# Patient Record
Sex: Female | Born: 1935 | Race: Black or African American | Hispanic: No | Smoking: Former smoker
Health system: Southern US, Community
[De-identification: ages and names within clinical notes are randomized; demographics above are authoritative.]

## PROBLEM LIST (undated history)

## (undated) DIAGNOSIS — I739 Peripheral vascular disease, unspecified: Secondary | ICD-10-CM

## (undated) DIAGNOSIS — M329 Systemic lupus erythematosus, unspecified: Secondary | ICD-10-CM

## (undated) DIAGNOSIS — K219 Gastro-esophageal reflux disease without esophagitis: Secondary | ICD-10-CM

## (undated) DIAGNOSIS — R011 Cardiac murmur, unspecified: Secondary | ICD-10-CM

## (undated) DIAGNOSIS — M1990 Unspecified osteoarthritis, unspecified site: Secondary | ICD-10-CM

## (undated) DIAGNOSIS — I10 Essential (primary) hypertension: Secondary | ICD-10-CM

## (undated) DIAGNOSIS — I5032 Chronic diastolic (congestive) heart failure: Secondary | ICD-10-CM

## (undated) DIAGNOSIS — IMO0002 Reserved for concepts with insufficient information to code with codable children: Secondary | ICD-10-CM

## (undated) DIAGNOSIS — M349 Systemic sclerosis, unspecified: Secondary | ICD-10-CM

## (undated) HISTORY — PX: ABDOMINAL HYSTERECTOMY: SHX81

## (undated) HISTORY — PX: CORONARY ANGIOPLASTY: SHX604

---

## 2000-11-19 ENCOUNTER — Encounter: Payer: Self-pay | Admitting: Internal Medicine

## 2000-11-19 ENCOUNTER — Ambulatory Visit (HOSPITAL_COMMUNITY): Admission: RE | Admit: 2000-11-19 | Discharge: 2000-11-19 | Payer: Self-pay | Admitting: Internal Medicine

## 2001-02-20 ENCOUNTER — Ambulatory Visit (HOSPITAL_COMMUNITY): Admission: RE | Admit: 2001-02-20 | Discharge: 2001-02-20 | Payer: Self-pay | Admitting: *Deleted

## 2001-02-20 ENCOUNTER — Encounter: Payer: Self-pay | Admitting: *Deleted

## 2004-11-13 ENCOUNTER — Encounter: Admission: RE | Admit: 2004-11-13 | Discharge: 2004-11-13 | Payer: Self-pay | Admitting: Internal Medicine

## 2008-08-18 ENCOUNTER — Encounter: Admission: RE | Admit: 2008-08-18 | Discharge: 2008-08-18 | Payer: Self-pay | Admitting: Internal Medicine

## 2010-07-29 ENCOUNTER — Encounter: Payer: Self-pay | Admitting: Internal Medicine

## 2010-07-30 ENCOUNTER — Encounter: Payer: Self-pay | Admitting: Internal Medicine

## 2010-08-23 ENCOUNTER — Other Ambulatory Visit: Payer: Self-pay | Admitting: Internal Medicine

## 2010-08-23 DIAGNOSIS — R112 Nausea with vomiting, unspecified: Secondary | ICD-10-CM

## 2010-08-24 ENCOUNTER — Ambulatory Visit
Admission: RE | Admit: 2010-08-24 | Discharge: 2010-08-24 | Disposition: A | Discharge status: Home / Self Care | Payer: Medicare Other | Source: Ambulatory Visit | Attending: Internal Medicine | Admitting: Internal Medicine

## 2010-08-24 DIAGNOSIS — R112 Nausea with vomiting, unspecified: Secondary | ICD-10-CM

## 2010-08-24 MED ORDER — IOHEXOL 300 MG/ML  SOLN
100.0000 mL | Freq: Once | INTRAMUSCULAR | Status: AC | PRN
  Administered 2010-08-24: 100 mL via INTRAVENOUS

## 2010-11-24 NOTE — Cardiovascular Report (Signed)
[place]. [facility]  Patient:    Shannon Rice, Shannon Rice                   MRN: [id number] Proc. Date: 02/20/01 Adm. Date:  [id number] Attending:  Lenise Herald H CC:         Stefan Church. Karilyn Cota, M.D.   Cardiac Catheterization  PROCEDURES: 1. Left heart catheterization. 2. Coronary angiography. 3. Left ventriculogram. 4. Abdominal aortogram.  INDICATIONS:  The patient is a 77 year old female, patient of Dr. Karilyn Cota, with a history of chest tightness and abnormal Cardiolite.  The patient also has a history of hypertension.  She did have a stress Cardiolite revealing evidence of anteroapical ischemia with normal EF.  Two-dimensional echocardiogram revealed mild left ventricular thickening with normal EF.  She is now brought to the cardiac catheterization lab to define her coronary anatomy.  DESCRIPTION OF PROCEDURE:  After given informed written consent, the patient was brought to the cardiac catheterization lab where her right and left groins were shaved, prepped, and draped in the usual sterile fashion.  ECG monitoring was established.  Using modified Seldinger technique, a #6 French arterial sheath was inserted in the right femoral artery.  A 6 French diagnostic catheter was then used to perform diagnostic angiography.  This reveals a large left main with mild 30% ostial tapering.  The LAD is a large vessel which coursed to the apex and gave rise to two diagonal branches.  There is noted to be mild calcification in the proximal LAD.  The first and second diagonals are medium sized vessels with no significant disease.  The left coronary artery also gave rise to a medium sized ramus intermedius, which is noted to have mild 40% ostial narrowing.  The AV groove circumflex is a large vessel which gave rise to two obtuse marginal branches.  The AV groove circumflex has no significant disease.  The first OM is a large vessel which bifurcates distally and has  no significant disease.  The second OM is a medium sized vessel with no significant disease.  The right coronary artery is a medium sized vessel which is dominant and gives rise to both the PDA as well as the posterolateral branch.  There is mild 50% mid RCA narrowing.  The PDA and posterolateral branch have no significant disease.  LEFT VENTRICULOGRAM:  The left ventriculogram reveals preserved EF at 70%.  Abdominal aortogram reveals no evidence of significant renal artery stenosis. There is a small infrarenal abdominal aneurysm noted.  There does not appear to be any significant iliac disease.  HEMODYNAMICS:  Systemic arterial pressure 149/62, LV systemic pressure 149/12, LVEDP of 20.  CONCLUSIONS: 1. No significant coronary artery disease. 2. Normal left ventricular systolic function. 3. No evidence of renal artery stenosis. 4. Small infrarenal abdominal aneurysm. 5. Systemic hypertension. DD:  02/20/01 TD:  02/20/01 Job: [id number]

## 2013-09-23 ENCOUNTER — Other Ambulatory Visit: Payer: Self-pay | Admitting: Internal Medicine

## 2013-09-23 ENCOUNTER — Ambulatory Visit
Admission: RE | Admit: 2013-09-23 | Discharge: 2013-09-23 | Disposition: A | Discharge status: Home / Self Care | Payer: Medicare Other | Source: Ambulatory Visit | Attending: Internal Medicine | Admitting: Internal Medicine

## 2013-09-23 DIAGNOSIS — R222 Localized swelling, mass and lump, trunk: Secondary | ICD-10-CM

## 2014-05-13 ENCOUNTER — Ambulatory Visit
Admission: RE | Admit: 2014-05-13 | Discharge: 2014-05-13 | Disposition: A | Discharge status: Home / Self Care | Payer: Medicare Other | Source: Ambulatory Visit | Attending: Internal Medicine | Admitting: Internal Medicine

## 2014-05-13 ENCOUNTER — Other Ambulatory Visit: Payer: Self-pay | Admitting: Internal Medicine

## 2014-05-13 DIAGNOSIS — R059 Cough, unspecified: Secondary | ICD-10-CM

## 2014-05-13 DIAGNOSIS — R05 Cough: Secondary | ICD-10-CM

## 2014-05-13 DIAGNOSIS — R634 Abnormal weight loss: Secondary | ICD-10-CM

## 2014-05-31 ENCOUNTER — Encounter (HOSPITAL_BASED_OUTPATIENT_CLINIC_OR_DEPARTMENT_OTHER): Payer: Medicare Other | Attending: Plastic Surgery

## 2014-05-31 DIAGNOSIS — I2510 Atherosclerotic heart disease of native coronary artery without angina pectoris: Secondary | ICD-10-CM | POA: Insufficient documentation

## 2014-05-31 DIAGNOSIS — L98499 Non-pressure chronic ulcer of skin of other sites with unspecified severity: Secondary | ICD-10-CM | POA: Diagnosis not present

## 2014-05-31 DIAGNOSIS — Z7982 Long term (current) use of aspirin: Secondary | ICD-10-CM | POA: Insufficient documentation

## 2014-05-31 DIAGNOSIS — I739 Peripheral vascular disease, unspecified: Secondary | ICD-10-CM | POA: Diagnosis present

## 2014-05-31 DIAGNOSIS — Z720 Tobacco use: Secondary | ICD-10-CM | POA: Insufficient documentation

## 2014-05-31 DIAGNOSIS — L930 Discoid lupus erythematosus: Secondary | ICD-10-CM | POA: Diagnosis not present

## 2014-05-31 NOTE — Consult Note (Signed)
[name]:  [name], [name]          ACCOUNT NO.:  [id number]  MEDICAL RECORD NO.:  [id number]  LOCATION:  FOOT                         FACILITY:  [facility]  PHYSICIAN:  Glenna FellowsBrinda Edell Mesenbrink, MD   DATE OF BIRTH:  06/25/1931  DATE OF CONSULTATION:  05/31/2014 DATE OF DISCHARGE:                                CONSULTATION   CHIEF COMPLAINT:  Bilateral hand ulcerations.  HISTORY OF PRESENT ILLNESS:  The patient is an 80 year old female with history of discoid lupus that is referred to the [facility] by Dr. Ludwig ClarksMoreira for evaluation.  The patient has previously been evaluated by Dr. Kellie Simmeringruslow [facility].  The patient notes that since approximately August of 2015, she has had an ulceration over her left index finger.  She reports that the right hand has also been ulcerated throughout that time.  Review of the studies sent with the patient indicates that hemoglobin of 13.1, white count of 5.3, platelets of 148, albumin is listed as 4.0, blood glucose is 98, creatinine is normal at 0.53.  Review of the chart indicates an x-ray was performed of the left hand and was read as did not suggest bony involvement.  Her current wound care has been cleaning with peroxide and placing Neosporin ointment.  She occasionally covers the wounds.  She continues to actively smoke.  Review of her referral note indicates the patient has significant peripheral vascular disease, bilateral internal carotid stenosis, coronary artery disease, and discoid lupus.  The referral note also states the patient has associated scleroderma, both the patient and her daughter who accompanied her today state that they were not aware that she has diagnosis of scleroderma.  PAST MEDICAL HISTORY:  As per HPI.  Coronary artery disease with extensive atherosclerosis from last imaging done in 2012, last echocardiogram in 2014 showed grade 1 diastolic dysfunction, last cardiac stress test was normal in September of  2014.  She has a history of bilateral moderate internal carotid stenosis from followup study dated March 16, 2014.  She has a history of discoid lupus for which she is on topical medication.  She is followed by Rheumatology.  She has a history of weight loss.  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  History of euthyroid goiter.  PAST SURGICAL HISTORY:  Includes cataract surgery and hysterectomy.  MEDICATIONS:  Include metoprolol, aspirin, benazepril, amlodipine, Livalo, and Centrum.  She takes Boost supplementation.  Desonide cream.  ALLERGIES:  Include CODEINE which causes nausea.  SOCIAL HISTORY:  The patient is an active smoker.  PHYSICAL EXAMINATION:  Blood pressure is 176/67, temperature is 98, pulse is 98, weight is 100 pounds, height is 5 feet 2 inches.  Over her right index finger, she has ulceration measured as 1.1 x 0.6 x 0.2 cm. This is over the distal phalanx medially and encroaches the joint space. Over the left index finger, she has an open wound over the medial DIP joint measured as 2 x 1.8 x 0.9 cm.  This second wound has exposure of her joint, ligaments and tendons which are appear dry in nature.  There is no surrounding cellulitis.  She has no movement at her  DIP joint over the left index finger.  Her skin is thin and shiny and attenuated consistent with her diagnosis of discoid lupus.  There is no gross exposure of bone.  ASSESSMENT:  Bilateral finger ulcerations on the left side involving her joint.  We will repeat her x-rays here.  We will obtain a prealbumin. She should continue with her Boost supplementation as well as her multivitamin, and we reviewed additional ways to increase her protein intake.  I counseled her that the most important intervention for her is to stop smoking with her underlying diagnosis, it is very difficult to try to heal these wounds, namely her lupus as well as the significant peripheral  vascular disease as documented.  I encouraged her family to discuss the other findings in the referral note including her internal carotid stenosis which puts her at a greater risk for stroke as well as the possible diagnosis of scleroderma.  In the interim, we will start collagen dressing changes to be completed every other day.  No soaking of the wound.  Okay to remove the dressings for her shower on the day of dressing changes.  Also pointed out that her left hand ulcer has progressed to the level of the joint space.  She has no movement of this joint.  If any infection develops, she would likely require an amputation, at least partial of this finger which would also be subject to significant wound healing problems giving her underlying comorbidities.  PLAN:  For followup in 2 week's time.          ______________________________ Glenna FellowsBrinda Brewster Wolters, MD MBA     BT/MEDQ  D:  05/31/2014  T:  05/31/2014  Job:  [id number]

## 2014-06-10 ENCOUNTER — Ambulatory Visit (HOSPITAL_COMMUNITY)
Admission: RE | Admit: 2014-06-10 | Discharge: 2014-06-10 | Disposition: A | Discharge status: Home / Self Care | Payer: Medicare Other | Source: Ambulatory Visit | Attending: Plastic Surgery | Admitting: Plastic Surgery

## 2014-06-10 ENCOUNTER — Other Ambulatory Visit (HOSPITAL_COMMUNITY): Payer: Self-pay | Admitting: Plastic Surgery

## 2014-06-10 DIAGNOSIS — M869 Osteomyelitis, unspecified: Secondary | ICD-10-CM | POA: Insufficient documentation

## 2014-06-10 DIAGNOSIS — M85841 Other specified disorders of bone density and structure, right hand: Secondary | ICD-10-CM | POA: Insufficient documentation

## 2014-06-14 ENCOUNTER — Encounter (HOSPITAL_BASED_OUTPATIENT_CLINIC_OR_DEPARTMENT_OTHER): Payer: Medicare Other | Attending: Plastic Surgery

## 2014-06-14 DIAGNOSIS — L98499 Non-pressure chronic ulcer of skin of other sites with unspecified severity: Secondary | ICD-10-CM | POA: Diagnosis not present

## 2014-06-28 DIAGNOSIS — L98499 Non-pressure chronic ulcer of skin of other sites with unspecified severity: Secondary | ICD-10-CM | POA: Diagnosis not present

## 2014-07-19 ENCOUNTER — Encounter (HOSPITAL_BASED_OUTPATIENT_CLINIC_OR_DEPARTMENT_OTHER): Payer: Medicare Other | Attending: Plastic Surgery

## 2014-07-19 DIAGNOSIS — M349 Systemic sclerosis, unspecified: Secondary | ICD-10-CM | POA: Diagnosis not present

## 2014-07-19 DIAGNOSIS — L98499 Non-pressure chronic ulcer of skin of other sites with unspecified severity: Secondary | ICD-10-CM | POA: Insufficient documentation

## 2014-07-19 DIAGNOSIS — M329 Systemic lupus erythematosus, unspecified: Secondary | ICD-10-CM | POA: Diagnosis present

## 2014-08-09 ENCOUNTER — Encounter (HOSPITAL_BASED_OUTPATIENT_CLINIC_OR_DEPARTMENT_OTHER): Payer: Medicare Other | Attending: Plastic Surgery

## 2014-08-09 DIAGNOSIS — M349 Systemic sclerosis, unspecified: Secondary | ICD-10-CM | POA: Insufficient documentation

## 2014-08-09 DIAGNOSIS — M329 Systemic lupus erythematosus, unspecified: Secondary | ICD-10-CM | POA: Diagnosis not present

## 2014-08-09 DIAGNOSIS — L98491 Non-pressure chronic ulcer of skin of other sites limited to breakdown of skin: Secondary | ICD-10-CM | POA: Insufficient documentation

## 2014-08-30 DIAGNOSIS — L98491 Non-pressure chronic ulcer of skin of other sites limited to breakdown of skin: Secondary | ICD-10-CM | POA: Diagnosis not present

## 2014-08-30 DIAGNOSIS — M349 Systemic sclerosis, unspecified: Secondary | ICD-10-CM | POA: Diagnosis not present

## 2014-08-30 DIAGNOSIS — M329 Systemic lupus erythematosus, unspecified: Secondary | ICD-10-CM | POA: Diagnosis not present

## 2014-09-20 ENCOUNTER — Encounter (HOSPITAL_BASED_OUTPATIENT_CLINIC_OR_DEPARTMENT_OTHER): Payer: Medicare Other | Attending: Plastic Surgery

## 2014-09-20 DIAGNOSIS — L940 Localized scleroderma [morphea]: Secondary | ICD-10-CM | POA: Diagnosis not present

## 2014-09-20 DIAGNOSIS — L98492 Non-pressure chronic ulcer of skin of other sites with fat layer exposed: Secondary | ICD-10-CM | POA: Insufficient documentation

## 2014-09-20 DIAGNOSIS — L930 Discoid lupus erythematosus: Secondary | ICD-10-CM | POA: Diagnosis not present

## 2014-10-11 ENCOUNTER — Encounter (HOSPITAL_BASED_OUTPATIENT_CLINIC_OR_DEPARTMENT_OTHER): Payer: Medicare Other | Attending: Plastic Surgery

## 2014-10-11 DIAGNOSIS — M349 Systemic sclerosis, unspecified: Secondary | ICD-10-CM | POA: Diagnosis not present

## 2014-10-11 DIAGNOSIS — L98494 Non-pressure chronic ulcer of skin of other sites with necrosis of bone: Secondary | ICD-10-CM | POA: Insufficient documentation

## 2014-10-11 DIAGNOSIS — F17210 Nicotine dependence, cigarettes, uncomplicated: Secondary | ICD-10-CM | POA: Diagnosis not present

## 2014-10-11 DIAGNOSIS — L98491 Non-pressure chronic ulcer of skin of other sites limited to breakdown of skin: Secondary | ICD-10-CM | POA: Diagnosis present

## 2014-10-11 DIAGNOSIS — M329 Systemic lupus erythematosus, unspecified: Secondary | ICD-10-CM | POA: Insufficient documentation

## 2014-11-01 DIAGNOSIS — M329 Systemic lupus erythematosus, unspecified: Secondary | ICD-10-CM | POA: Diagnosis not present

## 2014-11-01 DIAGNOSIS — F17210 Nicotine dependence, cigarettes, uncomplicated: Secondary | ICD-10-CM | POA: Diagnosis not present

## 2014-11-01 DIAGNOSIS — L98491 Non-pressure chronic ulcer of skin of other sites limited to breakdown of skin: Secondary | ICD-10-CM | POA: Diagnosis not present

## 2014-11-01 DIAGNOSIS — L98494 Non-pressure chronic ulcer of skin of other sites with necrosis of bone: Secondary | ICD-10-CM | POA: Diagnosis not present

## 2014-11-22 ENCOUNTER — Encounter (HOSPITAL_BASED_OUTPATIENT_CLINIC_OR_DEPARTMENT_OTHER): Payer: Medicare Other | Attending: Plastic Surgery

## 2014-11-22 DIAGNOSIS — L940 Localized scleroderma [morphea]: Secondary | ICD-10-CM | POA: Insufficient documentation

## 2014-11-22 DIAGNOSIS — L98494 Non-pressure chronic ulcer of skin of other sites with necrosis of bone: Secondary | ICD-10-CM | POA: Insufficient documentation

## 2014-11-22 DIAGNOSIS — F17200 Nicotine dependence, unspecified, uncomplicated: Secondary | ICD-10-CM | POA: Diagnosis not present

## 2014-11-22 DIAGNOSIS — L98491 Non-pressure chronic ulcer of skin of other sites limited to breakdown of skin: Secondary | ICD-10-CM | POA: Insufficient documentation

## 2014-11-22 DIAGNOSIS — L930 Discoid lupus erythematosus: Secondary | ICD-10-CM | POA: Diagnosis not present

## 2014-11-29 ENCOUNTER — Other Ambulatory Visit (HOSPITAL_COMMUNITY): Payer: Self-pay | Admitting: Orthopedic Surgery

## 2014-11-29 DIAGNOSIS — M349 Systemic sclerosis, unspecified: Secondary | ICD-10-CM

## 2014-12-07 ENCOUNTER — Other Ambulatory Visit: Payer: Self-pay | Admitting: Radiology

## 2014-12-08 ENCOUNTER — Ambulatory Visit (HOSPITAL_COMMUNITY)
Admission: RE | Admit: 2014-12-08 | Discharge: 2014-12-08 | Disposition: A | Discharge status: Home / Self Care | Payer: Medicare Other | Source: Ambulatory Visit | Attending: Orthopedic Surgery | Admitting: Orthopedic Surgery

## 2014-12-08 ENCOUNTER — Encounter (HOSPITAL_COMMUNITY): Payer: Self-pay

## 2014-12-08 ENCOUNTER — Other Ambulatory Visit (HOSPITAL_COMMUNITY): Payer: Self-pay | Admitting: Orthopedic Surgery

## 2014-12-08 DIAGNOSIS — K219 Gastro-esophageal reflux disease without esophagitis: Secondary | ICD-10-CM | POA: Diagnosis not present

## 2014-12-08 DIAGNOSIS — Z7982 Long term (current) use of aspirin: Secondary | ICD-10-CM | POA: Diagnosis not present

## 2014-12-08 DIAGNOSIS — F17210 Nicotine dependence, cigarettes, uncomplicated: Secondary | ICD-10-CM | POA: Diagnosis not present

## 2014-12-08 DIAGNOSIS — M349 Systemic sclerosis, unspecified: Secondary | ICD-10-CM | POA: Diagnosis present

## 2014-12-08 DIAGNOSIS — M329 Systemic lupus erythematosus, unspecified: Secondary | ICD-10-CM | POA: Insufficient documentation

## 2014-12-08 DIAGNOSIS — I10 Essential (primary) hypertension: Secondary | ICD-10-CM | POA: Diagnosis not present

## 2014-12-08 DIAGNOSIS — I7025 Atherosclerosis of native arteries of other extremities with ulceration: Secondary | ICD-10-CM | POA: Insufficient documentation

## 2014-12-08 DIAGNOSIS — I700 Atherosclerosis of aorta: Secondary | ICD-10-CM | POA: Diagnosis not present

## 2014-12-08 DIAGNOSIS — M1990 Unspecified osteoarthritis, unspecified site: Secondary | ICD-10-CM | POA: Diagnosis not present

## 2014-12-08 DIAGNOSIS — S61209A Unspecified open wound of unspecified finger without damage to nail, initial encounter: Secondary | ICD-10-CM | POA: Insufficient documentation

## 2014-12-08 DIAGNOSIS — L98499 Non-pressure chronic ulcer of skin of other sites with unspecified severity: Secondary | ICD-10-CM | POA: Diagnosis not present

## 2014-12-08 HISTORY — DX: Unspecified osteoarthritis, unspecified site: M19.90

## 2014-12-08 HISTORY — DX: Systemic lupus erythematosus, unspecified: M32.9

## 2014-12-08 HISTORY — DX: Essential (primary) hypertension: I10

## 2014-12-08 HISTORY — DX: Gastro-esophageal reflux disease without esophagitis: K21.9

## 2014-12-08 HISTORY — DX: Reserved for concepts with insufficient information to code with codable children: IMO0002

## 2014-12-08 LAB — PROTIME-INR
INR: 1.12 (ref 0.00–1.49)
Prothrombin Time: 14.6 seconds (ref 11.6–15.2)

## 2014-12-08 LAB — CBC WITH DIFFERENTIAL/PLATELET
BASOS PCT: 1 % (ref 0–1)
Basophils Absolute: 0.1 10*3/uL (ref 0.0–0.1)
EOS ABS: 0.2 10*3/uL (ref 0.0–0.7)
EOS PCT: 3 % (ref 0–5)
HCT: 39.6 % (ref 36.0–46.0)
Hemoglobin: 12.7 g/dL (ref 12.0–15.0)
LYMPHS PCT: 24 % (ref 12–46)
Lymphs Abs: 1.4 10*3/uL (ref 0.7–4.0)
MCH: 30.2 pg (ref 26.0–34.0)
MCHC: 32.1 g/dL (ref 30.0–36.0)
MCV: 94.3 fL (ref 78.0–100.0)
MONO ABS: 0.6 10*3/uL (ref 0.1–1.0)
MONOS PCT: 10 % (ref 3–12)
NEUTROS ABS: 3.5 10*3/uL (ref 1.7–7.7)
NEUTROS PCT: 62 % (ref 43–77)
PLATELETS: 135 10*3/uL — AB (ref 150–400)
RBC: 4.2 MIL/uL (ref 3.87–5.11)
RDW: 14.5 % (ref 11.5–15.5)
WBC: 5.6 10*3/uL (ref 4.0–10.5)

## 2014-12-08 LAB — BASIC METABOLIC PANEL
Anion gap: 10 (ref 5–15)
BUN: 19 mg/dL (ref 6–20)
CO2: 26 mmol/L (ref 22–32)
Calcium: 9.3 mg/dL (ref 8.9–10.3)
Chloride: 103 mmol/L (ref 101–111)
Creatinine, Ser: 0.44 mg/dL (ref 0.44–1.00)
GFR calc Af Amer: >60 mL/min (ref >60)
GFR calc non Af Amer: >60 mL/min (ref >60)
GLUCOSE: 106 mg/dL — AB (ref 65–99)
Potassium: 4.1 mmol/L (ref 3.5–5.1)
Sodium: 139 mmol/L (ref 135–145)

## 2014-12-08 LAB — APTT: aPTT: 28 seconds (ref 24–37)

## 2014-12-08 MED ORDER — SODIUM CHLORIDE 0.9 % IV SOLN
INTRAVENOUS | Status: DC
  Administered 2014-12-08: 08:00:00 via INTRAVENOUS

## 2014-12-08 MED ORDER — FENTANYL CITRATE (PF) 100 MCG/2ML IJ SOLN
INTRAMUSCULAR | Status: AC | PRN
  Administered 2014-12-08 (×2): 25 ug via INTRAVENOUS

## 2014-12-08 MED ORDER — FENTANYL CITRATE (PF) 100 MCG/2ML IJ SOLN
INTRAMUSCULAR | Status: AC
  Filled 2014-12-08: qty 4

## 2014-12-08 MED ORDER — SODIUM CHLORIDE 0.9 % IV SOLN: INTRAVENOUS | Status: DC

## 2014-12-08 MED ORDER — LIDOCAINE HCL 1 % IJ SOLN
INTRAMUSCULAR | Status: AC
  Filled 2014-12-08: qty 20

## 2014-12-08 MED ORDER — IOHEXOL 300 MG/ML  SOLN
100.0000 mL | Freq: Once | INTRAMUSCULAR | Status: AC | PRN
  Administered 2014-12-08: 30 mL

## 2014-12-08 MED ORDER — MIDAZOLAM HCL 2 MG/2ML IJ SOLN
INTRAMUSCULAR | Status: AC
  Filled 2014-12-08: qty 6

## 2014-12-08 MED ORDER — IODIXANOL 320 MG/ML IV SOLN
100.0000 mL | Freq: Once | INTRAVENOUS | Status: AC | PRN
  Administered 2014-12-08: 36 mL via INTRA_ARTERIAL

## 2014-12-08 MED ORDER — MIDAZOLAM HCL 2 MG/2ML IJ SOLN
INTRAMUSCULAR | Status: AC | PRN
  Administered 2014-12-08: 0.5 mg via INTRAVENOUS
  Administered 2014-12-08: 1 mg via INTRAVENOUS
  Administered 2014-12-08 (×2): 0.5 mg via INTRAVENOUS

## 2014-12-08 MED ORDER — NITROGLYCERIN 1 MG/10 ML FOR IR/CATH LAB
INTRA_ARTERIAL | Status: AC | PRN
  Administered 2014-12-08 (×2): 200 ug via INTRA_ARTERIAL

## 2014-12-08 MED ORDER — TRAMADOL HCL 50 MG PO TABS
50.0000 mg | ORAL_TABLET | Freq: Four times a day (QID) | ORAL | Status: DC
  Administered 2014-12-08: 50 mg via ORAL
  Filled 2014-12-08: qty 1

## 2014-12-08 MED ORDER — IOHEXOL 300 MG/ML  SOLN
200.0000 mL | Freq: Once | INTRAMUSCULAR | Status: AC | PRN
  Administered 2014-12-08: 80 mL

## 2014-12-08 NOTE — Sedation Documentation (Signed)
5Fr sheath removed from R Femoral Artery by Jerral Bonitoathy Causey, RTR.  Groin level 0, RDP/PT + Doppler.

## 2014-12-08 NOTE — H&P (Signed)
Chief Complaint: Non-healing ulcers bilateral index fingers  Referring Physician(s): Kuzma,Gary  History of Present Illness: Shannon Rice is a 81 y.o. female with long standing history of non-healing finger wounds.  She states she gets callouses on her fingers and that she tends to pick at them.  She uses clippers to try and trip them.  She states they have been "worse" for months.  She says they have been present since about last November.  She is a smoker and states she is trying to "cut back"  We are asked to perform an arteriogram of both upper extremities to her fingers to evaluate for any vascular disease.  Past Medical History  Diagnosis Date  . Hypertension   . GERD (gastroesophageal reflux disease)   . Arthritis   . Lupus     skin, scleraderma    Past Surgical History  Procedure Laterality Date  . Coronary angioplasty    . Abdominal hysterectomy      Allergies: Review of patient's allergies indicates no known allergies.  Medications: Prior to Admission medications   Medication Sig Start Date End Date Taking? Authorizing Provider  acetaminophen (TYLENOL) 500 MG tablet Take 1,000 mg by mouth every 6 (six) hours as needed for moderate pain or headache.    Historical Provider, MD  amLODipine-benazepril (LOTREL) 5-20 MG per capsule Take 1 capsule by mouth daily.    Historical Provider, MD  aspirin EC 325 MG tablet Take 325 mg by mouth daily.    Historical Provider, MD  diphenhydrAMINE-zinc acetate (BENADRYL) cream Apply 1 application topically 3 (three) times daily as needed for itching.    Historical Provider, MD  ibuprofen (ADVIL,MOTRIN) 200 MG tablet Take 400 mg by mouth every 6 (six) hours as needed for headache or moderate pain.    Historical Provider, MD  metoprolol tartrate (LOPRESSOR) 25 MG tablet Take 25 mg by mouth 2 (two) times daily.    Historical Provider, MD  Pitavastatin Calcium 4 MG TABS Take 2 mg by mouth 2 (two) times daily.    Historical  Provider, MD     Family History  Problem Relation Age of Onset  . Diabetes Sister   . Diabetes Brother     History   Social History  . Marital Status: Widowed    Spouse Name: N/A  . Number of Children: N/A  . Years of Education: N/A   Social History Main Topics  . Smoking status: Current Every Day Smoker -- 0.25 packs/day for 60 years    Types: Cigarettes  . Smokeless tobacco: Not on file     Comment: Pt refused info  . Alcohol Use: Not on file     Comment: glass wine on special occasions  . Drug Use: Not on file  . Sexual Activity: Not on file   Other Topics Concern  . None   Social History Narrative  . None      Review of Systems  Constitutional: Positive for appetite change. Negative for fever, chills, activity change and fatigue.  Respiratory: Negative for cough, chest tightness and shortness of breath.   Cardiovascular: Negative for chest pain.  Gastrointestinal: Negative for nausea, vomiting and abdominal pain.  Musculoskeletal: Negative.   Skin: Positive for color change and wound.  Neurological: Negative.   Psychiatric/Behavioral: Negative.     Vital Signs: BP 154/60 mmHg  Pulse 63  Temp(Src) 97.3 F (36.3 C) (Oral)  Resp 20  SpO2 97%  Physical Exam  Constitutional: She is oriented to person, place,  and time.  Thin  HENT:  Head: Normocephalic and atraumatic.  Eyes: EOM are normal.  Neck: Neck supple. No thyromegaly present.  Cardiovascular: Normal rate, regular rhythm and normal heart sounds.   Pulmonary/Chest: Effort normal and breath sounds normal.  Abdominal: Soft. Bowel sounds are normal.  Musculoskeletal: Normal range of motion.  Neurological: She is alert and oriented to person, place, and time.  Skin:  Ischemic appearing ulcerations on both index fingers.  Psychiatric: She has a normal mood and affect. Her behavior is normal. Judgment and thought content normal.  Vitals reviewed. Vascular: 2+ Radial and Brachial pulses bilaterally.  1+ ulnar pulses bilaterally.  Diminished DPA and PTA pulses bilaterally.  Normal Femoral pulses bilaterally. Mallampati Score:  MD Evaluation Airway: WNL Heart: WNL Abdomen: WNL Chest/ Lungs: WNL ASA  Classification: 2 Mallampati/Airway Score: Two  Imaging: No results found.  Labs:  CBC:  Recent Labs  12/08/14 0755  WBC 5.6  HGB 12.7  HCT 39.6  PLT 135*    COAGS:  Recent Labs  12/08/14 0755  INR 1.12  APTT 28    BMP:  Recent Labs  12/08/14 0755  NA 139  K 4.1  CL 103  CO2 26  GLUCOSE 106*  BUN 19  CALCIUM 9.3  CREATININE 0.44  GFRNONAA >60  GFRAA >60    LIVER FUNCTION TESTS: No results for input(s): BILITOT, AST, ALT, ALKPHOS, PROT, ALBUMIN in the last 8760 hours.  TUMOR MARKERS: No results for input(s): AFPTM, CEA, CA199, CHROMGRNA in the last 8760 hours.  Assessment and Plan:  Finger ulcerations. ? Scleroderma vs Vascular disease.  Will proceed with Common Femoral Artery catheterization and bilateral upper extremity arteriograms by Dr. Ruel Favorsrevor Shick today.  Risks and Benefits discussed with the patient including, but not limited to bleeding, infection, vascular injury or contrast induced renal failure. All of the patient's questions were answered, patient is agreeable to proceed. Consent signed and in chart.  Thank you for this interesting consult.  I greatly enjoyed meeting Shannon Rice and look forward to participating in their care.  SignedGolden Pop: Sherice Ijames R 12/08/2014, 8:53 AM   I spent a total of  20 Minutes  in face to face in clinical consultation, greater than 50% of which was counseling/coordinating care for Arteriogram of upper extremities.

## 2014-12-08 NOTE — Discharge Instructions (Signed)
Arteriogram °Care After °These instructions give you information on caring for yourself after your procedure. Your doctor may also give you more specific instructions. Call your doctor if you have any problems or questions after your procedure. °HOME CARE °· Keep your leg straight for at least 6 hours. °· Do not bathe, swim, or use a hot tub until directed by your doctor. You can shower. °· Do not lift anything heavier than 10 pounds (about a gallon of milk) for 2 days. °· Do not walk a lot, run, or drive for 2 days. °· Return to normal activities in 2 days or as told by your doctor. °Finding out the results of your test °Ask when your test results will be ready. Make sure you get your test results. °GET HELP RIGHT AWAY IF:  °· You have fever. °· You have more pain in your leg. °· The leg that was cut is: °¨ Bleeding. °¨ Puffy (swollen) or red. °¨ Cold. °¨ Pale or changes color. °¨ Weak. °¨ Tingly or numb. °If you go to the Emergency Room, tell your nurse that you have had an arteriogram. Take this paper with you to show the nurse. °MAKE SURE YOU: °· Understand these instructions. °· Will watch your condition. °· Will get help right away if you are not doing well or get worse. °Document Released: 09/21/2008 Document Revised: 06/30/2013 Document Reviewed: 09/21/2008 °ExitCare® Patient Information ©2015 [employer]. This information is not intended to replace advice given to you by your health care provider. Make sure you discuss any questions you have with your health care provider. °Conscious Sedation °Sedation is the use of medicines to promote relaxation and relieve discomfort and anxiety. Conscious sedation is a type of sedation. Under conscious sedation you are less alert than normal but are still able to respond to instructions or stimulation. Conscious sedation is used during short medical and dental procedures. It is milder than deep sedation or general anesthesia and allows you to return to your regular  activities sooner.  °LET YOUR HEALTH CARE PROVIDER KNOW ABOUT:  °· Any allergies you have. °· All medicines you are taking, including vitamins, herbs, eye drops, creams, and over-the-counter medicines. °· Use of steroids (by mouth or creams). °· Previous problems you or members of your family have had with the use of anesthetics. °· Any blood disorders you have. °· Previous surgeries you have had. °· Medical conditions you have. °· Possibility of pregnancy, if this applies. °· Use of cigarettes, alcohol, or illegal drugs. °RISKS AND COMPLICATIONS °Generally, this is a safe procedure. However, as with any procedure, problems can occur. Possible problems include: °· Oversedation. °· Trouble breathing on your own. You may need to have a breathing tube until you are awake and breathing on your own. °· Allergic reaction to any of the medicines used for the procedure. °BEFORE THE PROCEDURE °· You may have blood tests done. These tests can help show how well your kidneys and liver are working. They can also show how well your blood clots. °· A physical exam will be done.   °· Only take medicines as directed by your health care provider. You may need to stop taking medicines (such as blood thinners, aspirin, or nonsteroidal anti-inflammatory drugs) before the procedure.   °· Do not eat or drink at least 6 hours before the procedure or as directed by your health care provider. °· Arrange for a responsible adult, family member, or friend to take you home after the procedure. He or she should stay   with you for at least 24 hours after the procedure, until the medicine has worn off. PROCEDURE   An intravenous (IV) catheter will be inserted into one of your veins. Medicine will be able to flow directly into your body through this catheter. You may be given medicine through this tube to help prevent pain and help you relax.  The medical or dental procedure will be done. AFTER THE PROCEDURE  You will stay in a recovery area  until the medicine has worn off. Your blood pressure and pulse will be checked.   Depending on the procedure you had, you may be allowed to go home when you can tolerate liquids and your pain is under control. Document Released: 03/20/2001 Document Revised: 06/30/2013 Document Reviewed: 03/02/2013 [facility] Patient Information 2015 [place], [place]. This information is not intended to replace advice given to you by your health care provider. Make sure you discuss any questions you have with your health care provider.  Conscious Sedation, Adult, Care After Refer to this sheet in the next few weeks. These instructions provide you with information on caring for yourself after your procedure. Your health care provider may also give you more specific instructions. Your treatment has been planned according to current medical practices, but problems sometimes occur. Call your health care provider if you have any problems or questions after your procedure. WHAT TO EXPECT AFTER THE PROCEDURE  After your procedure:  You may feel sleepy, clumsy, and have poor balance for several hours.  Vomiting may occur if you eat too soon after the procedure. HOME CARE INSTRUCTIONS  Do not participate in any activities where you could become injured for at least 24 hours. Do not:  Drive.  Swim.  Ride a bicycle.  Operate heavy machinery.  Cook.  Use power tools.  Climb ladders.  Work from a high place.  Do not make important decisions or sign legal documents until you are improved.  If you vomit, drink water, juice, or soup when you can drink without vomiting. Make sure you have little or no nausea before eating solid foods.  Only take over-the-counter or prescription medicines for pain, discomfort, or fever as directed by your health care provider.  Make sure you and your family fully understand everything about the medicines given to you, including what side effects may occur.  You should not drink  alcohol, take sleeping pills, or take medicines that cause drowsiness for at least 24 hours.  If you smoke, do not smoke without supervision.  If you are feeling better, you may resume normal activities 24 hours after you were sedated.  Keep all appointments with your health care provider. SEEK MEDICAL CARE IF:  Your skin is pale or bluish in color.  You continue to feel nauseous or vomit.  Your pain is getting worse and is not helped by medicine.  You have bleeding or swelling.  You are still sleepy or feeling clumsy after 24 hours. SEEK IMMEDIATE MEDICAL CARE IF:  You develop a rash.  You have difficulty breathing.  You develop any type of allergic problem.  You have a fever. MAKE SURE YOU:  Understand these instructions.  Will watch your condition.  Will get help right away if you are not doing well or get worse. Document Released: 04/15/2013 Document Reviewed: 04/15/2013 [facility] Patient Information 2015 [place]Care, [place]LLC. This information is not intended to replace advice given to you by your health care provider. Make sure you discuss any questions you have with your health care  provider  Remove dressing 24 to 48 hours post procedure prior to shower or bath. Keep wound clean and dry.

## 2014-12-08 NOTE — Procedures (Signed)
Successful arch and bilateral upper extremity angiography No comp Stable Full report in pacs

## 2014-12-08 NOTE — Sedation Documentation (Signed)
Tegaderm applied to R femoral arterial puncture.  Groin level 0, drsg CDI, RDP + Doppler.

## 2014-12-13 ENCOUNTER — Encounter (HOSPITAL_BASED_OUTPATIENT_CLINIC_OR_DEPARTMENT_OTHER): Payer: Medicare Other | Attending: Plastic Surgery

## 2014-12-13 DIAGNOSIS — M329 Systemic lupus erythematosus, unspecified: Secondary | ICD-10-CM | POA: Insufficient documentation

## 2014-12-13 DIAGNOSIS — M349 Systemic sclerosis, unspecified: Secondary | ICD-10-CM | POA: Diagnosis not present

## 2014-12-13 DIAGNOSIS — F17200 Nicotine dependence, unspecified, uncomplicated: Secondary | ICD-10-CM | POA: Diagnosis not present

## 2014-12-13 DIAGNOSIS — L98491 Non-pressure chronic ulcer of skin of other sites limited to breakdown of skin: Secondary | ICD-10-CM | POA: Diagnosis present

## 2015-01-03 DIAGNOSIS — L98491 Non-pressure chronic ulcer of skin of other sites limited to breakdown of skin: Secondary | ICD-10-CM | POA: Diagnosis not present

## 2015-01-03 DIAGNOSIS — F17200 Nicotine dependence, unspecified, uncomplicated: Secondary | ICD-10-CM | POA: Diagnosis not present

## 2015-01-03 DIAGNOSIS — M329 Systemic lupus erythematosus, unspecified: Secondary | ICD-10-CM | POA: Diagnosis not present

## 2015-01-03 DIAGNOSIS — M349 Systemic sclerosis, unspecified: Secondary | ICD-10-CM | POA: Diagnosis not present

## 2015-01-19 ENCOUNTER — Other Ambulatory Visit: Payer: Self-pay | Admitting: Orthopedic Surgery

## 2015-01-25 ENCOUNTER — Encounter (HOSPITAL_BASED_OUTPATIENT_CLINIC_OR_DEPARTMENT_OTHER): Payer: Self-pay | Admitting: *Deleted

## 2015-01-27 ENCOUNTER — Ambulatory Visit (HOSPITAL_BASED_OUTPATIENT_CLINIC_OR_DEPARTMENT_OTHER)
Admission: RE | Admit: 2015-01-27 | Discharge: 2015-01-27 | Disposition: A | Discharge status: Home / Self Care | Payer: Medicare Other | Source: Ambulatory Visit | Attending: Orthopedic Surgery | Admitting: Orthopedic Surgery

## 2015-01-27 ENCOUNTER — Encounter (HOSPITAL_BASED_OUTPATIENT_CLINIC_OR_DEPARTMENT_OTHER)
Admission: RE | Disposition: A | Discharge status: Home / Self Care | Payer: Self-pay | Source: Ambulatory Visit | Attending: Orthopedic Surgery

## 2015-01-27 ENCOUNTER — Ambulatory Visit (HOSPITAL_BASED_OUTPATIENT_CLINIC_OR_DEPARTMENT_OTHER): Payer: Medicare Other | Admitting: Certified Registered"

## 2015-01-27 ENCOUNTER — Encounter (HOSPITAL_BASED_OUTPATIENT_CLINIC_OR_DEPARTMENT_OTHER): Payer: Self-pay | Admitting: Orthopedic Surgery

## 2015-01-27 DIAGNOSIS — M349 Systemic sclerosis, unspecified: Secondary | ICD-10-CM | POA: Insufficient documentation

## 2015-01-27 DIAGNOSIS — F17210 Nicotine dependence, cigarettes, uncomplicated: Secondary | ICD-10-CM | POA: Diagnosis not present

## 2015-01-27 DIAGNOSIS — L98499 Non-pressure chronic ulcer of skin of other sites with unspecified severity: Secondary | ICD-10-CM | POA: Diagnosis not present

## 2015-01-27 DIAGNOSIS — I10 Essential (primary) hypertension: Secondary | ICD-10-CM | POA: Insufficient documentation

## 2015-01-27 DIAGNOSIS — L930 Discoid lupus erythematosus: Secondary | ICD-10-CM | POA: Diagnosis not present

## 2015-01-27 DIAGNOSIS — K219 Gastro-esophageal reflux disease without esophagitis: Secondary | ICD-10-CM | POA: Insufficient documentation

## 2015-01-27 HISTORY — PX: SYMPATHECTOMY: SHX792

## 2015-01-27 HISTORY — DX: Systemic sclerosis, unspecified: M34.9

## 2015-01-27 HISTORY — PX: AMPUTATION: SHX166

## 2015-01-27 SURGERY — SYMPATHECTOMY
Anesthesia: General | Site: Hand | Laterality: Right

## 2015-01-27 MED ORDER — CHLORHEXIDINE GLUCONATE 4 % EX LIQD: 60.0000 mL | Freq: Once | CUTANEOUS | Status: DC

## 2015-01-27 MED ORDER — FENTANYL CITRATE (PF) 100 MCG/2ML IJ SOLN: 25.0000 ug | INTRAMUSCULAR | Status: DC | PRN

## 2015-01-27 MED ORDER — DEXAMETHASONE SODIUM PHOSPHATE 10 MG/ML IJ SOLN
INTRAMUSCULAR | Status: DC | PRN
  Administered 2015-01-27: 8 mg via INTRAVENOUS

## 2015-01-27 MED ORDER — CEFAZOLIN SODIUM-DEXTROSE 2-3 GM-% IV SOLR
2.0000 g | INTRAVENOUS | Status: AC
  Administered 2015-01-27: 2 g via INTRAVENOUS

## 2015-01-27 MED ORDER — LACTATED RINGERS IV SOLN
INTRAVENOUS | Status: DC
  Administered 2015-01-27: 13:00:00 via INTRAVENOUS

## 2015-01-27 MED ORDER — CEFAZOLIN SODIUM-DEXTROSE 2-3 GM-% IV SOLR: 2.0000 g | INTRAVENOUS | Status: DC

## 2015-01-27 MED ORDER — GLYCOPYRROLATE 0.2 MG/ML IJ SOLN
0.2000 mg | Freq: Once | INTRAMUSCULAR | Status: AC | PRN
  Administered 2015-01-27: 0.2 mg via INTRAVENOUS

## 2015-01-27 MED ORDER — FENTANYL CITRATE (PF) 100 MCG/2ML IJ SOLN
50.0000 ug | INTRAMUSCULAR | Status: DC | PRN
  Administered 2015-01-27: 50 ug via INTRAVENOUS

## 2015-01-27 MED ORDER — ROPIVACAINE HCL 5 MG/ML IJ SOLN
INTRAMUSCULAR | Status: DC | PRN
  Administered 2015-01-27: 20 mL via PERINEURAL

## 2015-01-27 MED ORDER — PROPOFOL 10 MG/ML IV BOLUS
INTRAVENOUS | Status: DC | PRN
  Administered 2015-01-27: 20 mg via INTRAVENOUS
  Administered 2015-01-27: 80 mg via INTRAVENOUS

## 2015-01-27 MED ORDER — FENTANYL CITRATE (PF) 100 MCG/2ML IJ SOLN
INTRAMUSCULAR | Status: AC
Start: 2015-01-27 — End: 2015-01-27
  Filled 2015-01-27: qty 2

## 2015-01-27 MED ORDER — HYDROCODONE-ACETAMINOPHEN 5-325 MG PO TABS: 1.0000 | ORAL_TABLET | Freq: Four times a day (QID) | ORAL | Status: DC | PRN

## 2015-01-27 MED ORDER — FENTANYL CITRATE (PF) 100 MCG/2ML IJ SOLN
INTRAMUSCULAR | Status: AC
  Filled 2015-01-27: qty 6

## 2015-01-27 MED ORDER — MIDAZOLAM HCL 2 MG/2ML IJ SOLN: 1.0000 mg | INTRAMUSCULAR | Status: DC | PRN

## 2015-01-27 MED ORDER — MIDAZOLAM HCL 2 MG/2ML IJ SOLN
INTRAMUSCULAR | Status: AC
Start: 2015-01-27 — End: 2015-01-27
  Filled 2015-01-27: qty 2

## 2015-01-27 MED ORDER — PROMETHAZINE HCL 25 MG/ML IJ SOLN
6.2500 mg | INTRAMUSCULAR | Status: DC | PRN
Start: 2015-01-27 — End: 2015-01-27

## 2015-01-27 MED ORDER — MIDAZOLAM HCL 2 MG/2ML IJ SOLN
INTRAMUSCULAR | Status: AC
  Filled 2015-01-27: qty 2

## 2015-01-27 MED ORDER — ONDANSETRON HCL 4 MG/2ML IJ SOLN
INTRAMUSCULAR | Status: DC | PRN
  Administered 2015-01-27: 4 mg via INTRAVENOUS

## 2015-01-27 MED ORDER — SCOPOLAMINE 1 MG/3DAYS TD PT72: 1.0000 | MEDICATED_PATCH | Freq: Once | TRANSDERMAL | Status: DC | PRN

## 2015-01-27 SURGICAL SUPPLY — 63 items
BAG DECANTER FOR FLEXI CONT (MISCELLANEOUS) IMPLANT
BLADE MINI RND TIP GREEN BEAV (BLADE) IMPLANT
BLADE OSC/SAG .038X5.5 CUT EDG (BLADE) IMPLANT
BLADE SURG 15 STRL LF DISP TIS (BLADE) ×2 IMPLANT
BLADE SURG 15 STRL SS (BLADE) ×2
BNDG COHESIVE 1X5 TAN STRL LF (GAUZE/BANDAGES/DRESSINGS) ×4 IMPLANT
BNDG COHESIVE 3X5 TAN STRL LF (GAUZE/BANDAGES/DRESSINGS) ×4 IMPLANT
BNDG ESMARK 4X9 LF (GAUZE/BANDAGES/DRESSINGS) ×4 IMPLANT
BNDG GAUZE ELAST 4 BULKY (GAUZE/BANDAGES/DRESSINGS) ×4 IMPLANT
CHLORAPREP W/TINT 26ML (MISCELLANEOUS) ×4 IMPLANT
CORDS BIPOLAR (ELECTRODE) ×4 IMPLANT
COVER BACK TABLE 60X90IN (DRAPES) ×4 IMPLANT
COVER MAYO STAND STRL (DRAPES) ×4 IMPLANT
CUFF TOURNIQUET SINGLE 18IN (TOURNIQUET CUFF) IMPLANT
DECANTER SPIKE VIAL GLASS SM (MISCELLANEOUS) IMPLANT
DRAIN PENROSE 1/4X12 LTX STRL (WOUND CARE) IMPLANT
DRAPE EXTREMITY T 121X128X90 (DRAPE) ×4 IMPLANT
DRAPE OEC MINIVIEW 54X84 (DRAPES) IMPLANT
DRAPE SURG 17X23 STRL (DRAPES) ×4 IMPLANT
DRSG KUZMA FLUFF (GAUZE/BANDAGES/DRESSINGS) IMPLANT
GAUZE SPONGE 4X4 12PLY STRL (GAUZE/BANDAGES/DRESSINGS) ×4 IMPLANT
GAUZE XEROFORM 1X8 LF (GAUZE/BANDAGES/DRESSINGS) ×4 IMPLANT
GLOVE BIOGEL PI IND STRL 8.5 (GLOVE) ×2 IMPLANT
GLOVE BIOGEL PI INDICATOR 8.5 (GLOVE) ×2
GLOVE SURG ORTHO 8.0 STRL STRW (GLOVE) ×8 IMPLANT
GOWN STRL REUS W/ TWL LRG LVL3 (GOWN DISPOSABLE) ×2 IMPLANT
GOWN STRL REUS W/TWL LRG LVL3 (GOWN DISPOSABLE) ×3
GOWN STRL REUS W/TWL XL LVL3 (GOWN DISPOSABLE) ×8 IMPLANT
LOOP VESSEL MAXI BLUE (MISCELLANEOUS) IMPLANT
NDL SAFETY ECLIPSE 18X1.5 (NEEDLE) IMPLANT
NEEDLE HYPO 18GX1.5 SHARP (NEEDLE)
NEEDLE PRECISIONGLIDE 27X1.5 (NEEDLE) IMPLANT
NS IRRIG 1000ML POUR BTL (IV SOLUTION) ×4 IMPLANT
PACK BASIN DAY SURGERY FS (CUSTOM PROCEDURE TRAY) ×4 IMPLANT
PAD CAST 3X4 CTTN HI CHSV (CAST SUPPLIES) IMPLANT
PAD CAST 4YDX4 CTTN HI CHSV (CAST SUPPLIES) IMPLANT
PADDING CAST ABS 4INX4YD NS (CAST SUPPLIES) ×2
PADDING CAST ABS COTTON 4X4 ST (CAST SUPPLIES) ×2 IMPLANT
PADDING CAST COTTON 3X4 STRL (CAST SUPPLIES)
PADDING CAST COTTON 4X4 STRL (CAST SUPPLIES)
SLEEVE SCD COMPRESS KNEE MED (MISCELLANEOUS) ×4 IMPLANT
SPEAR EYE SURG WECK-CEL (MISCELLANEOUS) ×8 IMPLANT
SPLINT FINGER 4.25 BULB 911906 (SOFTGOODS) IMPLANT
SPLINT PLASTER CAST XFAST 3X15 (CAST SUPPLIES) IMPLANT
SPLINT PLASTER XTRA FASTSET 3X (CAST SUPPLIES)
STOCKINETTE 4X48 STRL (DRAPES) ×4 IMPLANT
SUT CHROMIC 4 0 P 3 18 (SUTURE) IMPLANT
SUT ETHIBOND 3-0 V-5 (SUTURE) IMPLANT
SUT ETHILON 4 0 PS 2 18 (SUTURE) ×8 IMPLANT
SUT ETHILON 9 0 BV100 4 (SUTURE) ×4 IMPLANT
SUT ETHILON 9 0 V 100.4 (SUTURE) IMPLANT
SUT MERSILENE 4 0 P 3 (SUTURE) IMPLANT
SUT MERSILENE 6 0 P 1 (SUTURE) IMPLANT
SUT SILK 4 0 PS 2 (SUTURE) ×4 IMPLANT
SUT VIC AB 4-0 P-3 18XBRD (SUTURE) IMPLANT
SUT VIC AB 4-0 P3 18 (SUTURE)
SUT VICRYL 4-0 PS2 18IN ABS (SUTURE) IMPLANT
SWAB COLLECTION DEVICE MRSA (MISCELLANEOUS) ×4 IMPLANT
SYR BULB 3OZ (MISCELLANEOUS) ×4 IMPLANT
SYR CONTROL 10ML LL (SYRINGE) IMPLANT
TOWEL OR 17X24 6PK STRL BLUE (TOWEL DISPOSABLE) ×8 IMPLANT
TUBE ANAEROBIC SPECIMEN COL (MISCELLANEOUS) ×4 IMPLANT
UNDERPAD 30X30 (UNDERPADS AND DIAPERS) ×4 IMPLANT

## 2015-01-27 NOTE — Transfer of Care (Signed)
Immediate Anesthesia Transfer of Care Note  Patient: Shannon Rice  Procedure(s) Performed: Procedure(s): RIGHT RADICAL ARTERY SYMPATHECTOMY, RIGHT ULNAR ARTERY SYMPATHECTOMY, DIGITAL SYMPATHECTOMY RIGHT INDEX, MIDDLE, RING SMALL (Right) AMPUTATION DISTAL TIP RIGHT INDEX FINGER (Right)  Patient Location: PACU  Anesthesia Type:GA combined with regional for post-op pain  Level of Consciousness: sedated, patient cooperative and lethargic  Airway & Oxygen Therapy: Patient Spontanous Breathing and Patient connected to face mask oxygen  Post-op Assessment: Report given to RN and Post -op Vital signs reviewed and stable  Post vital signs: Reviewed and stable  Last Vitals:  Filed Vitals:   01/27/15 1315  BP:   Pulse: 56  Resp:     Complications: No apparent anesthesia complications

## 2015-01-27 NOTE — Discharge Instructions (Addendum)

## 2015-01-27 NOTE — Progress Notes (Signed)
Assisted Dr. Rose with right, ultrasound guided, interscalene  block. Side rails up, monitors on throughout procedure. See vital signs in flow sheet. Tolerated Procedure well. 

## 2015-01-27 NOTE — Anesthesia Postprocedure Evaluation (Signed)
  Anesthesia Post-op Note  Patient: Shannon Rice  Procedure(s) Performed: Procedure(s) (LRB): RIGHT RADICAL ARTERY SYMPATHECTOMY, RIGHT ULNAR ARTERY SYMPATHECTOMY, DIGITAL SYMPATHECTOMY RIGHT INDEX, MIDDLE, RING SMALL (Right) AMPUTATION DISTAL TIP RIGHT INDEX FINGER (Right)  Patient Location: PACU  Anesthesia Type: GA combined with regional for post-op pain  Level of Consciousness: awake and alert   Airway and Oxygen Therapy: Patient Spontanous Breathing  Post-op Pain: mild  Post-op Assessment: Post-op Vital signs reviewed, Patient's Cardiovascular Status Stable, Respiratory Function Stable, Patent Airway and No signs of Nausea or vomiting  Last Vitals:  Filed Vitals:   01/27/15 1600  BP: 140/51  Pulse: 57  Temp:   Resp: 21    Post-op Vital Signs: stable   Complications: No apparent anesthesia complications

## 2015-01-27 NOTE — Op Note (Signed)
Shannon Rice, Shannon Rice NO.:  [id number]  MEDICAL RECORD NO.:  [id number]  LOCATION:                               FACILITY:  [facility]  PHYSICIAN:  [name], M.D.       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  POSTOPERATIVE DIAGNOSIS:  Scleroderma with ulcers upon the index fingers.  OPERATION:  Periarterial sympathectomy, radial artery, ulnar artery, common digital arteries, index, middle, ring, and small and amputation index finger .  SURGEON:  [name], MD.  ASSISTANT:  Betha Loa, MD.  ANESTHESIA:  Supraclavicular block general with amputation of the index finger.  DATE OF OPERATION:  January 27, 2015.  ANESTHESIOLOGIST:  Rose.  HISTORY:  The patient is an 81 year old female with a history of scleroderma, has developed an ulcer on tip of her index fingers bilaterally.  She has had arteriograms done revealing significant changes in the entire arterial system.  She is admitted for periarterial sympathectomies with amputation to the index finger.  In the preoperative area, the patient was seen, extremities were marked with both the patient and surgeon.  She is aware that there is no guarantee with the surgery with possibility of infection; recurrence of injury to arteries, nerves, tendons, incomplete relief of symptoms, dystrophy, possibility of further loss of fingers, further intervention being necessary.  DESCRIPTION OF PROCEDURE:  The patient was given a supraclavicular block in the preoperative area under the direction Dr. Okey Dupre.  She was brought to the operating room, where she was placed in a supine position on the operating table.  General anesthetic was given.  She was prepped using ChloraPrep in supine position with the right arm free.  A 3-minute dry time was allowed.   Time-out was taken confirming the patient and procedure.  The limb was exsanguinated with an Esmarch bandage. Tourniquet was placed high on the arm, was inflated to 250 mmHg. Incisions were made over the volar, radial, and ulnar arteries.  An incision was made over the dorsal radial artery, a less incision across the arch and brought out towards the fingers.  Each were carried down through subcutaneous tissue until the vascular structures were identified beneath them.  The operative microscope was then brought into position.  The radial and ulnar arteries were approached first.  A digital periarterial sympathectomy under microscopic control was then performed for approximately 3 cm on the radial and ulnar artery, volarly an incision on the dorsal portion of the radial artery was then performed.  This was at the level of the carpometacarpal joint of the thumb, slightly proximal, slightly distal.  Artery was stripped down to muscularis.  This was approximately 2 cm to 2.5 cm in length.  The arch was identified, the common digital arteries to the index, middle, ring, and little fingers.  The index were extremely small, were difficult to visualize.  The remainders were much larger; and sympathectomies using the operative microscope were then performed for approximately 2 cm on each vessel.  The wounds were copiously irrigated with saline.  The skin  was then closed with interrupted 4-0 nylon sutures.  The nail was then cut on the index finger.  The terminal portion of the distal phalanx was removed.  Cultures were taken for both aerobic and anaerobic cultures. A portion of bone from the distal phalanx was sent for cultures.  A sterile compressive dressing, nonadherent gauze, and splint to the index finger was applied.  A sterile compressive dressing to the hand and wrist was applied.  The patient tolerated the procedure well.  On deflation of the tourniquet, remaining fingers immediately  pinked.  She was taken to the recovery room for observation in satisfactory condition.  She will be discharged home to return to the [facility] in 1 week on Norco.    ______________________________ [name], M.D.   ______________________________ [name], M.D.    GK/MEDQ  D:  01/27/2015  T:  01/27/2015  Job:  [id number]

## 2015-01-27 NOTE — Anesthesia Procedure Notes (Addendum)
Anesthesia Regional Block:  Supraclavicular block  Pre-Anesthetic Checklist: ,, timeout performed, Correct Patient, Correct Site, Correct Laterality, Correct Procedure, Correct Position, site marked, Risks and benefits discussed,  Surgical consent,  Pre-op evaluation,  At surgeon's request and post-op pain management  Laterality: Right  Prep: chloraprep       Needles:  Injection technique: Single-shot  Needle Type: Echogenic Stimulator Needle     Needle Length: 9cm 9 cm Needle Gauge: 21 and 21 G    Additional Needles:  Procedures: ultrasound guided (picture in chart) and nerve stimulator Supraclavicular block Narrative:  Injection made incrementally with aspirations every 5 mL.  Performed by: Personally   Additional Notes: Risks, benefits and alternative to block explained extensively.  Patient tolerated procedure well, without complications.   Procedure Name: LMA Insertion Date/Time: 01/27/2015 1:43 PM Performed by: Gar Gibbon Pre-anesthesia Checklist: Patient identified, Emergency Drugs available, Suction available and Patient being monitored Patient Re-evaluated:Patient Re-evaluated prior to inductionOxygen Delivery Method: Circle System Utilized Preoxygenation: Pre-oxygenation with 100% oxygen Intubation Type: IV induction Ventilation: Mask ventilation without difficulty LMA: LMA inserted LMA Size: 3.0 Number of attempts: 1 Airway Equipment and Method: Bite block Placement Confirmation: positive ETCO2 Tube secured with: Tape Dental Injury: Teeth and Oropharynx as per pre-operative assessment

## 2015-01-27 NOTE — Anesthesia Preprocedure Evaluation (Signed)
Anesthesia Evaluation  Patient identified by MRN, date of birth, ID band Patient awake    Reviewed: Allergy & Precautions, NPO status , Patient's Chart, lab work & pertinent test results  Airway Mallampati: II  TM Distance: >3 FB Neck ROM: Full    Dental no notable dental hx.    Pulmonary Current Smoker,  breath sounds clear to auscultation  Pulmonary exam normal       Cardiovascular hypertension, Pt. on home beta blockers and Pt. on medications Normal cardiovascular examRhythm:Regular Rate:Normal     Neuro/Psych negative neurological ROS  negative psych ROS   GI/Hepatic negative GI ROS, Neg liver ROS,   Endo/Other  lupus  Renal/GU negative Renal ROS  negative genitourinary   Musculoskeletal negative musculoskeletal ROS (+)   Abdominal   Peds negative pediatric ROS (+)  Hematology negative hematology ROS (+)   Anesthesia Other Findings   Reproductive/Obstetrics negative OB ROS                             Anesthesia Physical Anesthesia Plan  ASA: III  Anesthesia Plan: General   Post-op Pain Management: GA combined w/ Regional for post-op pain   Induction: Intravenous  Airway Management Planned: LMA  Additional Equipment:   Intra-op Plan:   Post-operative Plan: Extubation in OR  Informed Consent: I have reviewed the patients History and Physical, chart, labs and discussed the procedure including the risks, benefits and alternatives for the proposed anesthesia with the patient or authorized representative who has indicated his/her understanding and acceptance.   Dental advisory given  Plan Discussed with: CRNA and Surgeon  Anesthesia Plan Comments:         Anesthesia Quick Evaluation

## 2015-01-27 NOTE — Brief Op Note (Signed)
01/27/2015  3:15 PM  PATIENT:  Shannon Rice  81 y.o. female  PRE-OPERATIVE DIAGNOSIS:  SCLERADERMA RIGHT HAND  POST-OPERATIVE DIAGNOSIS:  SCLERADERMA RIGHT HAND  PROCEDURE:  Procedure(s): RIGHT RADICAL ARTERY SYMPATHECTOMY, RIGHT ULNAR ARTERY SYMPATHECTOMY, DIGITAL SYMPATHECTOMY RIGHT INDEX, MIDDLE, RING SMALL (Right) AMPUTATION DISTAL TIP RIGHT INDEX FINGER (Right)  SURGEON:  Surgeon(s) and Role:    * [name], MD - Primary    * Betha Loa, MD - Assisting  PHYSICIAN ASSISTANT:   ASSISTANTS: K Vicke Plotner,MD   ANESTHESIA:   regional and general  EBL:     BLOOD ADMINISTERED:none  DRAINS: none   LOCAL MEDICATIONS USED:  NONE  SPECIMEN:  Source of Specimen:  cultures  DISPOSITION OF SPECIMEN:  PATHOLOGY  COUNTS:  YES  TOURNIQUET:   Total Tourniquet Time Documented: Upper Arm (Right) - 73 minutes Total: Upper Arm (Right) - 73 minutes   DICTATION: .Other Dictation: Dictation Number [id number]  PLAN OF CARE: Discharge to home after PACU  PATIENT DISPOSITION:  PACU - hemodynamically stable.

## 2015-01-27 NOTE — Op Note (Signed)
Dictation Number [id number]

## 2015-01-27 NOTE — H&P (Signed)
Shannon Rice is an 81 year old right hand dominant female referred by Dr. Dewaine Conger. She has an open area on her index fingers bilaterally at the DIP joints. This has been approximately 9 months. She says it began particularly on the right side when she clipped a lesion and cut a small area of skin out. It has not healed despite conservative treatment. She then was followed at the wound center since November. She has seen Dr. 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 There is no history of diabetes, thyroid problems, arthritis or gout. There is a family history of diabetes. She has been tested.  She complains of intermittent mild aching type pain with a feeling of swelling. She states it has gotten somewhat better. She has been on Santyl along with local care. She does smoke advised to quit and the reasons behind this. She does have elevated BP. She has had her arteriograms done revealing changes to the Scleroderma both sides with virtually no flow to the digital vessels especially on the right side to the index, ring and middle finger. On the left side there are small caliber vessels with filling defects especially the 2nd and 3rd palmar metacarpal arteries.   PAST MEDICAL HISTORY: She has no drug allergies. She is on Livalo, aspirin, amlodipine, metoprolol. She relates no other surgery.   FAMILY MEDICAL HISTORY: Positive for diabetes and high BP.  SOCIAL HISTORY:  She smokes  PPD advised to quit and the reasons behind this as far as circulation is concerned. She does not drink.   REVIEW OF SYSTEMS: Positive for weight loss, loss of appetite, glasses, high BP, sleep disorder, easy bleeding, easy bruising, otherwise negative 14 points.  Shannon Rice is an 81 y.o. female.   Chief Complaint: scleroderma with ulcers bilateral hands HPI: see above  Past Medical History  Diagnosis Date  . Hypertension   . GERD (gastroesophageal reflux disease)   . Lupus     skin,  scleraderma  . Scleroderma   . Arthritis     scleroderma    Past Surgical History  Procedure Laterality Date  . Abdominal hysterectomy    . Coronary angioplasty      Dr [name]    Family History  Problem Relation Age of Onset  . Diabetes Sister   . Diabetes Brother    Social History:  reports that she has been smoking Cigarettes.  She has a 15 pack-year smoking history. She does not have any smokeless tobacco history on file. She reports that she drinks alcohol. Her drug history is not on file.  Allergies: No Known Allergies  No prescriptions prior to admission    No results found for this or any previous visit (from the past 48 hour(s)).  No results found.   Pertinent items are noted in HPI.  Height  (1.575 m), weight 43.092 kg (95 lb).  General appearance: alert, cooperative and appears stated age Head: Normocephalic, without obvious abnormality Neck: no JVD Resp: clear to auscultation bilaterally Cardio: regular rate and rhythm, S1, S2 normal, no murmur, click, rub or gallop GI: soft, non-tender; bowel sounds normal; no masses,  no organomegaly Extremities: ulcer right index finger Pulses: 2+ and symmetric Skin: Skin color, texture, turgor normal. No rashes or lesions Neurologic: Grossly normal Incision/Wound: Necrosis index  Assessment/Plan X-rays reveal degenerative changes at the PIP and DIP joints, calcification of the TFCC, open areas. There is no evidence of osteomyelitis. We have discussed digital sympathectomies, peri-arterial sympathectomies of the  radial and ulnar arteries, common arch, digital artery. She is seen with her daughter.  Risks and complications are discussed. Pre, peri and post op care are discussed along with risks and complications. Patient is aware there is no guarantee with surgery, possibility of infection, injury to arteries, nerves, and tendons, incomplete relief and dystrophy. Also possibility of future amputations or partial  amputations of the fingers. We have discussed peri-arterial sympathectomies have been developed primarily for Scleroderma with reasonable results, not perfect. She is scheduled for radial ulnar artery sympathectomies, arch sympathectomies, digital sympathectomies to the fingers with amputation of the index finger right hand. for recheck of her Scleroderma answering questions with her daughter. These are answered to their satisfaction to the actual surgery, what is going to be done, how much of the index finger may need to be amputated. They are advised this may be to the level of the DIP joint if there is significant infection.   Shannon Rice R 01/27/2015, 8:53 AM

## 2015-01-28 ENCOUNTER — Encounter (HOSPITAL_BASED_OUTPATIENT_CLINIC_OR_DEPARTMENT_OTHER): Payer: Self-pay | Admitting: Orthopedic Surgery

## 2015-01-30 LAB — TISSUE CULTURE

## 2015-01-30 LAB — WOUND CULTURE: Gram Stain: NONE SEEN

## 2015-01-31 ENCOUNTER — Encounter (HOSPITAL_BASED_OUTPATIENT_CLINIC_OR_DEPARTMENT_OTHER): Payer: Medicare Other | Attending: Plastic Surgery

## 2015-01-31 DIAGNOSIS — L98491 Non-pressure chronic ulcer of skin of other sites limited to breakdown of skin: Secondary | ICD-10-CM | POA: Insufficient documentation

## 2015-01-31 DIAGNOSIS — L940 Localized scleroderma [morphea]: Secondary | ICD-10-CM | POA: Diagnosis not present

## 2015-01-31 DIAGNOSIS — L930 Discoid lupus erythematosus: Secondary | ICD-10-CM | POA: Diagnosis not present

## 2015-02-01 LAB — ANAEROBIC CULTURE: Gram Stain: NONE SEEN

## 2015-02-10 DIAGNOSIS — I739 Peripheral vascular disease, unspecified: Secondary | ICD-10-CM

## 2015-02-10 NOTE — H&P (Signed)
OFFICE VISIT NOTES COPIED TO EPIC FOR DOCUMENTATION  Shannon Rice 02-09-2015 8:14 AM Location: [facility] Patient #: [id number] DOB: 1931-04-06 Widowed / Language: [name] / Race: Black or African American Female  History of Present Illness [name] [name]; February 09, 2015 11:47 AM) The patient is a 81 year old female who presents for a Follow-up for Peripheral vascular disease. Symptoms include leg fatigue and other (toe ulceration), while symptoms do not include exertional leg pain or resting leg pain. Onset was gradual 1 month(s) ago. The patient describes this as worsening. Current treatment includes aspirin, calcium channel blockers, statins and smoking cessation. By report there is good compliance with treatment and good tolerance of treatment. Disease complications include recurrent ischemic ulcers. Pertinent medical history includes hypertension and hyperlipidemia, while pertinent medical history does not include diabetes or obesity. Risk factors include smoking (quit 2 weeks ago). The patient is currently able to do activities of daily living without limitations.  Additional reasons for visit:  Follow up test results is described as the following: The patient had a cardiovascular nuclear scan, echocardiography and lower extremity arterial duplex.  Problem List/Past Medical Shannon Rice; 02/09/15 10:55 AM) Benign essential hypertension (I10) Abnormal EKG (R94.31) Lexiscan myoview stress test 01/07/2015: 1. The resting electrocardiogram demonstrated normal sinus rhythm, IVCD and no resting arrhythmias. Stress EKG is non-diagnostic for ischemia as it a pharmacologic stress using Lexiscan. Stress symptoms included dyspnea, 2. Myocardial perfusion imaging is normal. Overall left ventricular systolic function was normal without regional wall motion abnormalities. The left ventricular ejection fraction was 64%. Bilateral carotid bruits (R09.89) Carotid artery duplex  09/16/2014: Mild stenosis of bilateral distal internal carotid artery, mid internal carotid artery and proximal internal carotid artery (<50% stenosis in the range of 16-49%). Bilateral vessels geometry is tortuous. Follow up in one year is appropriate if clinically indicated. Compared to the study done on 09/08/2013, no significant change in velocity. 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 Consider f/u in 5 years if clinically indicated as ectasia can progress. 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 Significant stenoses in the left external iliac artery of > 50%. This exam reveals moderately decreased perfusion of the right lower extremity with ABI 0.53 and critically decreased perfusion of the left lower extremity with ABI of 0.25, noted at the post tibial artery level. Consider further work-up. Hypercholesteremia (E78.0)  Allergies (Charavina Rice; Feb 09, 2015 10:55 AM) No Known Drug Allergies06/15/2016  Family History Shannon Rice; 02-09-15 10:55 AM) Mother Deceased. at age 52, from Natural Causes. No heart conditions known Father Deceased. at age 28, from apparant MI. No heart history known Sister 2 1 older, 1 younger Brother 1 younger, deceased  Social History (Charavina Rice; 02/09/15 10:55 AM) Current tobacco use Former smoker. Quit December 08, 2014 Alcohol Use Occasional alcohol use. Marital status Widowed. Number of Children 2. Living Situation Lives alone.  Past Surgical  History Shannon Rice; Feb 09, 2015 11:00 AM) Hysterectomy; [phone] Surgery to relieve muscle and nerves to her index finger 01/27/2015 Right.  Medication History (Charavina Rice; 02/09/15 11:01 AM) Aspirin (  Tablet, 1 Oral daily) Active. Metoprolol Tartrate (  Tablet, 1 Oral two times  daily) Active. Amlodipine Besy-Benazepril HCl (5-20MG  Capsule, 1 Oral daily) Active. Livalo (2MG  Tablet, 1 Oral daily) Active. Santyl (250UNIT/GM Ointment, apply thin layer to affected a External daily) Active. Saline Wound Wash (0.9% Solution, apply to wound External daily) Active. Desonide (0.05% Ointment, apply to face External daily) Active. SSD (1% Cream, apply to wound External daily) Active. Hydrocodone-Acetaminophen (5-325MG  Tablet, 1 Oral as needed for pain) Active. Medications Reconciled  Diagnostic Studies History [name]; 02-13-2015 8:16 AM) Nuclear stress test07/07/2014 1. The resting electrocardiogram demonstrated normal sinus rhythm, IVCD and no resting arrhythmias. Stress EKG is non-diagnostic for ischemia as it a pharmacologic stress using Lexiscan. Stress symptoms included dyspnea, 2. Myocardial perfusion imaging is normal. Overall left ventricular systolic function was normal without regional wall motion abnormalities. The left ventricular ejection fraction was 64%. 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 Consider f/u in 5 years if clinically indicated as ectasia can progress. Lower Extremity Arterial Duplex07/13/2016 Moderate velocity increase at the right proximal superficial femoral and posterior tibial arteries of > 50%. Significant stenoses in the left external iliac artery of > 50%. This exam reveals moderately decreased perfusion of the right lower extremity with ABI 0.53 and critically decreased perfusion of the left lower extremity with ABI of 0.25, noted  at the post tibial artery level. Consider further work-up.   Review of Systems [facility] [name], [place]; 13-Feb-2015 12:45 PM) General Not Present- Anorexia, Fatigue and Fever. Skin Present- Skin Color Changes and Ulcer. Respiratory Not Present- Cough, Decreased Exercise Tolerance and Dyspnea. Cardiovascular Present- Claudications. Not Present- Chest Pain, Edema, Orthopnea, Paroxysmal Nocturnal Dyspnea and Shortness of Breath. Gastrointestinal Not Present- Change in Bowel Habits, Constipation and Nausea. Neurological Not Present- Focal Neurological Symptoms. Endocrine Not Present- Appetite Changes, Cold Intolerance and Heat Intolerance. Hematology Not Present- Anemia, Petechiae and Prolonged Bleeding. Vitals Shannon Rice; 02-13-2015 11:04 AM) 02-13-2015 10:55 AM Weight: 92.5 lb Height: 62in Body Surface Area: 1.38 m Body Mass Index: 16.92 kg/m  Pulse: 59 (Regular)  P.OX: 95% (Room air) BP: 136/78 (Sitting, Left Arm, Standard)     Physical Exam (Bridgette [name], [name]; 02-13-2015 12:45 PM) General Mental Status-Alert. General Appearance-Cooperative, Appears stated age, Not in acute distress. Orientation-Oriented X3. Build & Nutrition-Lean and Petite.  Integumentary Problem #1 Description - Appearance - ulceration. Size - Description of size - small. Location - Left Lower Extremity - great toe. Problem #2 Description - Appearance - ulceration. Size - Description of size - small. Location - Right Upper Extremity - index finger. Problem #3 Description - Appearance - ulceration. Size - Description of size - small. Location - Left Upper Extremity - index finger.  Head and Neck Thyroid Gland Characteristics - no palpable nodules, no palpable enlargement.  Chest and Lung Exam Palpation Tender - No chest wall tenderness. Auscultation Breath sounds - Clear. Adventitious sounds - Expiratory wheeze - Left Upper Lobe  (Posterior).  Cardiovascular Inspection Jugular vein - Right - No Distention. Auscultation Heart Sounds - S1 WNL, S2 WNL and No gallop present. Murmurs & Other Heart Sounds - Murmur - No murmur.  Abdomen Palpation/Percussion Normal exam - Non Tender and No hepatosplenomegaly. Auscultation Normal exam - Bowel sounds normal.  Peripheral Vascular Lower Extremity Inspection - Bilateral - Delayed capillary refill, Digital ulcers(left 1st toe) and Shiny atrophic skin, No Pigmentation, No Varicose veins. Palpation - Edema - Left - No edema. Right - No edema. Femoral pulse - Left - 2+ and Bruit. Right - 1+  and Bruit. Popliteal pulse - Left - 1+. Right - 0+. Dorsalis pedis pulse - Left - 1+. Right - 0+. Posterior tibial pulse - Left - 0+. Right - 0+. Carotid arteries - Left-Soft bilateral carotid bruit.. Carotid arteries - Right-Soft bilateral carotid bruit.. Abdomen-Prominent abdominal aortic pulsation and Epigastric bruit present.  Neurologic Motor-Grossly intact without any focal deficits.  Musculoskeletal Global Assessment Left Lower Extremity - normal range of motion without pain. Right Lower Extremity - normal range of motion without pain.  Assessment & Plan (Bridgette [name] [name]; 02/01/2015 12:46 PM) Benign essential hypertension (I10) Impression: EKG 12/22/2014: Normal sinus rhythm at rate of 59 bpm, left atrial enlargement, normal axis. Incomplete right bundle branch block. LVH with repolarization of rheumatic, cannot exclude lateral ischemia. Current Plans Complete electrocardiogram (93000) Abnormal EKG (R94.31) Story: Lexiscan myoview stress test 01/07/2015: 1. The resting electrocardiogram demonstrated normal sinus rhythm, IVCD and no resting arrhythmias. Stress EKG is non-diagnostic for ischemia as it a pharmacologic stress using Lexiscan. Stress symptoms included dyspnea, 2. Myocardial perfusion imaging is normal. Overall left ventricular systolic function was  normal without regional wall motion abnormalities. The left ventricular ejection fraction was 64%. Impression: EKG 12/22/2014: Normal sinus rhythm at rate of 59 bpm, left atrial enlargement, normal axis. Incomplete right bundle branch block. LVH with repolarizatio abnormality, cannot exclude lateral ischemia. Bilateral carotid bruits (R09.89) Story: Carotid artery duplex 09/16/2014: Mild stenosis of bilateral distal internal carotid artery, mid internal carotid artery and proximal internal carotid artery (<50% stenosis in the range of 16-49%). Bilateral vessels geometry is tortuous. Follow up in one year is appropriate if clinically indicated. Compared to the study done on 09/08/2013, no significant change in velocity. 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 Consider f/u in 5 years if clinically indicated as ectasia can progress. H/O scleroderma (Z87.39) Peripheral vascular disease, secondary (I73.9) Story: Scleroderma associated small vessel disease and occlusion of the digit vessels by upper extremity angiogram 11/2014 Peripheral vascular disease of extremity (I73.9) Story: Lower extremity arterial duplex 01/19/2015: Moderate velocity increase at the right proximal superficial femoral and posterior tibial arteries of > 50%. Significant stenoses in the left external iliac artery of > 50%. This exam reveals moderately decreased perfusion of the right lower extremity with ABI 0.53 and critically decreased perfusion of the left lower extremity with ABI of 0.25, noted at the post tibial artery level. Consider further work-up. Current Plans Mechanism of underlying disease process and action of medications discussed with the patient. I discussed primary/secondary prevention and also dietary counseling was done. She presents for follow-up of peripheral  vascular disease and to discuss results of recent testing. Stress test revealed normal perfusion images without evidence of ischemia. Abdominal aortic duplex revealed severe calcific degeneration and ectasia measuring 2.72 x 2.74 x 2.72 cm. If necessary, this will be reevaluated in 5 years. Lower extremity duplex revealed moderate velocity increase at the right proximal superficial femoral and posterior tibial arteries are greater than 50% with RABI of 0.53 and significant stenoses of the left external iliac artery greater than 50% with LABI of 0.25. Nonhealing ulceration on left great toe still present. Needs to scheduled for peripheral arteriogram and possible angioplasty given signs and symptoms. Patient understands the risks, benefits, alternatives including medical therapy, CT angiography. Patient understands <1-2% risk of death, embolic complications, bleeding, infection, renal failure, urgent surgical revascularization, but not limited to these  and wants to proceed. She is aware that the left leg will be addressed first due to ulceration and decreased perfusion and it may require a second procedure for right LE due to the amount of contrast required. Follow up after procedure. Future Plans 02/08/2015: METABOLIC PANEL, BASIC [phone]) - one time 02/08/2015: CBC & PLATELETS (AUTO) [phone]) - one time 02/08/2015: PT (PROTHROMBIN TIME) (09811) - one time   Signed by [name], [name] (02/01/2015 12:46 PM)  I have personally reviewed the patient's record and performed physical exam and agree with the assessment and plan of Ms. [name], NP-C.   [name], MD 02/10/2015, 9:35 PM [facility] [place] Pager: [phone] Office: [phone] If no answer: Cell:  [phone]

## 2015-02-15 ENCOUNTER — Encounter (HOSPITAL_COMMUNITY): Payer: Self-pay | Admitting: Cardiology

## 2015-02-15 ENCOUNTER — Encounter (HOSPITAL_COMMUNITY)
Admission: RE | Disposition: A | Discharge status: Home / Self Care | Payer: Self-pay | Source: Ambulatory Visit | Attending: Cardiology

## 2015-02-15 ENCOUNTER — Ambulatory Visit (HOSPITAL_COMMUNITY)
Admission: RE | Admit: 2015-02-15 | Discharge: 2015-02-15 | Disposition: A | Discharge status: Home / Self Care | Payer: Medicare Other | Source: Ambulatory Visit | Attending: Cardiology | Admitting: Cardiology

## 2015-02-15 DIAGNOSIS — I70212 Atherosclerosis of native arteries of extremities with intermittent claudication, left leg: Secondary | ICD-10-CM | POA: Insufficient documentation

## 2015-02-15 DIAGNOSIS — I739 Peripheral vascular disease, unspecified: Secondary | ICD-10-CM

## 2015-02-15 HISTORY — PX: PERIPHERAL VASCULAR CATHETERIZATION: SHX172C

## 2015-02-15 SURGERY — LOWER EXTREMITY ANGIOGRAPHY
Anesthesia: LOCAL

## 2015-02-15 MED ORDER — HEPARIN (PORCINE) IN NACL 2-0.9 UNIT/ML-% IJ SOLN
INTRAMUSCULAR | Status: AC
  Filled 2015-02-15: qty 1000

## 2015-02-15 MED ORDER — SODIUM CHLORIDE 0.9 % IV SOLN: 1.0000 mL/kg/h | INTRAVENOUS | Status: DC

## 2015-02-15 MED ORDER — FENTANYL CITRATE (PF) 100 MCG/2ML IJ SOLN
INTRAMUSCULAR | Status: AC
  Filled 2015-02-15: qty 4

## 2015-02-15 MED ORDER — MIDAZOLAM HCL 2 MG/2ML IJ SOLN
INTRAMUSCULAR | Status: DC | PRN
Start: 2015-02-15 — End: 2015-02-15
  Administered 2015-02-15: 0.5 mg via INTRAVENOUS

## 2015-02-15 MED ORDER — LIDOCAINE HCL (PF) 1 % IJ SOLN
INTRAMUSCULAR | Status: DC | PRN
  Administered 2015-02-15: 20 mL

## 2015-02-15 MED ORDER — LIDOCAINE HCL (PF) 1 % IJ SOLN
INTRAMUSCULAR | Status: AC
  Filled 2015-02-15: qty 30

## 2015-02-15 MED ORDER — MIDAZOLAM HCL 2 MG/2ML IJ SOLN
INTRAMUSCULAR | Status: AC
  Filled 2015-02-15: qty 4

## 2015-02-15 MED ORDER — SODIUM CHLORIDE 0.9 % IV SOLN
INTRAVENOUS | Status: DC
  Administered 2015-02-15: 1000 mL via INTRAVENOUS

## 2015-02-15 MED ORDER — FENTANYL CITRATE (PF) 100 MCG/2ML IJ SOLN
INTRAMUSCULAR | Status: DC | PRN
  Administered 2015-02-15 (×2): 25 ug via INTRAVENOUS

## 2015-02-15 SURGICAL SUPPLY — 11 items
CATH CROSS OVER TEMPO 5F (CATHETERS) ×2 IMPLANT
CATH OMNI FLUSH 5F 65CM (CATHETERS) ×2 IMPLANT
CATH SOFT-VU 4F 65 STRAIGHT (CATHETERS) ×1 IMPLANT
CATH SOFT-VU STRAIGHT 4F 65CM (CATHETERS) ×1
KIT MICROINTRODUCER STIFF 5F (SHEATH) ×2 IMPLANT
KIT PV (KITS) ×2 IMPLANT
SHEATH PINNACLE 5F 10CM (SHEATH) ×2 IMPLANT
SYR MEDRAD MARK V 150ML (SYRINGE) ×2 IMPLANT
TRANSDUCER W/STOPCOCK (MISCELLANEOUS) ×2 IMPLANT
TRAY PV CATH (CUSTOM PROCEDURE TRAY) ×2 IMPLANT
WIRE HITORQ VERSACORE ST 145CM (WIRE) ×2 IMPLANT

## 2015-02-15 NOTE — Discharge Instructions (Signed)
Arteriogram °Care After °These instructions give you information on caring for yourself after your procedure. Your doctor may also give you more specific instructions. Call your doctor if you have any problems or questions after your procedure. °HOME CARE °· Do not bathe, swim, or use a hot tub until directed by your doctor. You can shower. °· Do not lift anything heavier than 10 pounds (about a gallon of milk) for 2 days. °· Do not walk a lot, run, or drive for 2 days. °· Return to normal activities in 2 days or as told by your doctor. °Finding out the results of your test °Ask when your test results will be ready. Make sure you get your test results. °GET HELP RIGHT AWAY IF:  °· You have fever. °· You have more pain in your leg. °· The leg that was cut is: °¨ Bleeding. °¨ Puffy (swollen) or red. °¨ Cold. °¨ Pale or changes color. °¨ Weak. °¨ Tingly or numb. °If you go to the Emergency Room, tell your nurse that you have had an arteriogram. Take this paper with you to show the nurse. °MAKE SURE YOU: °· Understand these instructions. °· Will watch your condition. °· Will get help right away if you are not doing well or get worse. °Document Released: 09/21/2008 Document Revised: 06/30/2013 Document Reviewed: 09/21/2008 °ExitCare® Patient Information ©2015 [employer]. This information is not intended to replace advice given to you by your health care provider. Make sure you discuss any questions you have with your health care provider. ° °

## 2015-02-15 NOTE — Interval H&P Note (Signed)
History and Physical Interval Note:  02/15/2015 7:49 AM  Shannon Rice Shannon Rice  has presented today for surgery, with the diagnosis of pad  The various methods of treatment have been discussed with the patient and family. After consideration of risks, benefits and other options for treatment, the patient has consented to  Procedure(s): Lower Extremity Angiography (N/A) and possible PTA as a surgical intervention .  The patient's history has been reviewed, patient examined, no change in status, stable for surgery.  I have reviewed the patient's chart and labs.  Questions were answered to the patient's satisfaction.     [name]

## 2015-02-21 LAB — FUNGUS CULTURE W SMEAR: FUNGAL SMEAR: NONE SEEN

## 2015-02-24 LAB — FUNGUS CULTURE W SMEAR: Fungal Smear: NONE SEEN

## 2015-03-11 LAB — AFB CULTURE WITH SMEAR (NOT AT ARMC)
Acid Fast Smear: NONE SEEN
Acid Fast Smear: NONE SEEN

## 2015-04-10 DIAGNOSIS — I998 Other disorder of circulatory system: Secondary | ICD-10-CM | POA: Diagnosis present

## 2015-04-10 DIAGNOSIS — I70229 Atherosclerosis of native arteries of extremities with rest pain, unspecified extremity: Secondary | ICD-10-CM | POA: Diagnosis present

## 2015-04-10 NOTE — H&P (Signed)
OFFICE VISIT NOTES COPIED TO EPIC FOR DOCUMENTATION  Shannon Rice 05-08-2015 9:55 AM Location: [facility] Patient #: [id number] DOB: 02-Aug-1930 Widowed / Language: [name] / Race: Black or African American Female   History of Present Illness Shannon Rice [name]; 2015-05-08 10:37 AM) The patient is a 81 year old female who presents for a follow-up for Peripheral vascular disease. Patient underwent peripheral arteriogram on 02/15/2015 revealing severely calcified severely diffusely diseased vessels, however there was still brisk blood flow to her foot, would recommend medical therapy. Extremely high risk for complications for percutaneous revascularization Her symptoms had initially begun to improve, however, we received a phone call from her podiatrist recently stating that wounds had become resistant to therapy and her pain was becoming severe. They requested she be reevaluated.   She has not had any chest pain, has chronic shortness of breath. She is tolerating all her medications well. She has remained abstinent from tobacco use. She reports worsening symptoms of claudication and also has throbbing pain at rest.   Problem List/Past Medical Shannon Rice; 05-08-15 10:00 AM) Benign essential hypertension (I10) Labs 02/10/2015: Creatinine 0.49, potassium 4.0, hemoglobin 10.5/HCT 30.6 with normocytic indices, PLT 135, PT 11.4, INR 1.1 Abnormal EKG (R94.31) Lexiscan myoview stress test 01/07/2015: 1. The resting electrocardiogram demonstrated normal sinus rhythm, IVCD and no resting arrhythmias. Stress EKG is non-diagnostic for ischemia as it a pharmacologic stress using Lexiscan. Stress symptoms included dyspnea, 2. Myocardial perfusion imaging is normal. Overall left ventricular systolic function was normal without regional wall motion abnormalities. The left ventricular ejection fraction was 64%. Bilateral carotid bruits (R09.89) Carotid artery duplex 09/16/2014: Mild  stenosis of bilateral distal internal carotid artery, mid internal carotid artery and proximal internal carotid artery (<50% stenosis in the range of 16-49%). Bilateral vessels geometry is tortuous. Follow up in one year is appropriate if clinically indicated. Compared to the study done on 09/08/2013, no significant change in velocity. 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 Consider f/u in 5 years if clinically indicated as ectasia can progress. H/O scleroderma (Z87.39) Peripheral vascular disease, secondary (I73.9) Scleroderma associated small vessel disease and occlusion of the digit vessels by upper extremity angiogram 11/2014 Peripheral vascular disease of extremity (I73.9) Peripheral arteriogram 02/15/2015: Peroneal runoff brisk below the knee bilateral. Severe diffuse calcification. 2 focal areas in mid SFA bilateral appear significant. PTA only if ulcer does not heal. High risk angioplasty due to calcification. Podiatry office visit 02/07/2015: Non-pressure ulceration left great toe, debrided. Lower extremity arterial duplex 01/19/2015: Moderate velocity increase at the right proximal superficial femoral and posterior tibial arteries of > 50%. Significant stenoses in the left external iliac artery of > 50%. This exam reveals moderately decreased perfusion of the right lower extremity with ABI 0.53 and critically decreased perfusion of the left lower extremity with ABI of 0.25, noted at the post tibial artery level. Consider further work-up. Hypercholesteremia (E78.00)  Allergies (Charavina Rice; 05/08/15 10:00 AM) No Known Drug Allergies06/15/2016  Family History Shannon Rice; 05/08/15 10:00 AM) Mother Deceased. at age 54, from Natural Causes. No heart conditions known Father Deceased. at age 24, from apparant MI. No heart history  known Sister 2 1 older, 1 younger Brother 1 younger, deceased  Social History (Charavina Rice; May 08, 2015 10:00 AM) Current tobacco use Former smoker. Quit December 08, 2014 Alcohol Use Occasional alcohol use. Marital status Widowed. Number of Children 2. Living  Situation Lives alone.  Past Surgical History Shannon Rice; 04/08/2015 10:00 AM) Hysterectomy; [phone] Surgery to relieve muscle and nerves to her index finger 01/27/2015 Right.  Medication History (Charavina Rice; 04/08/2015 10:09 AM) Cilostazol (50MG  Tablet, 1 (one) Tablet Oral two times daily, Taken starting 02/24/2015) Active. Aspirin (325MG  Tablet, 1 Oral daily) Active. Metoprolol Tartrate (25MG  Tablet, 1 Oral two times daily) Active. Amlodipine Besy-Benazepril HCl (5-20MG  Capsule, 1 Oral daily) Active. Livalo (2MG  Tablet, 1 Oral daily) Active. Santyl (250UNIT/GM Ointment, apply thin layer to affected a External daily) Active. Saline Wound Wash (0.9% Solution, apply to wound External daily) Active. Desonide (0.05% Ointment, apply to face External daily) Active. ValACYclovir HCl (1GM Tablet, 1 Oral three times daily) Active. (for ten days) Erythromycin (5MG /GM Ointment, apply to eye Ophthalmic two times daily) Active. Oxycodone-Acetaminophen (5-325MG  Tablet, 1 Oral every six to eight hours as needed for pain) Active. Medications Reconciled  Diagnostic Studies History (Charavina Rice; 04/08/2015 10:00 AM) Coronary Angiogram2006 No stents were placed. Done at [facility] upper extremity angiography06/07/2014 Successful arch and bilateral upper extremity angiography Nuclear stress test07/07/2014 1. The resting electrocardiogram demonstrated normal sinus rhythm, IVCD and no resting arrhythmias. Stress EKG is non-diagnostic for ischemia as it a pharmacologic stress using Lexiscan. Stress symptoms included dyspnea, 2. Myocardial perfusion imaging is normal. Overall left ventricular systolic  function was normal without regional wall motion abnormalities. The left ventricular ejection fraction was 64%. 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 Consider f/u in 5 years if clinically indicated as ectasia can progress. Lower Extremity Arterial Duplex07/13/2016 Moderate velocity increase at the right proximal superficial femoral and posterior tibial arteries of > 50%. Significant stenoses in the left external iliac artery of > 50%. This exam reveals moderately decreased perfusion of the right lower extremity with ABI 0.53 and critically decreased perfusion of the left lower extremity with ABI of 0.25, noted at the post tibial artery level. Consider further work-up. PV Angio08/03/2015 Peripheral arteriogram: Peroneal runoff brisk below the knee bilateral. Severe diffuse calcification. 2 focal areas in mid SFA bilateral appear significant. PTA only if ulcer does not heal. High risk angioplasty due to calcification.    Review of Systems [facility] [name], [place]; 04/08/2015 1:46 PM) General Not Present- Anorexia, Fatigue and Fever. Skin Present- Skin Color Changes and Ulcer. Respiratory Not Present- Cough, Decreased Exercise Tolerance and Dyspnea. Cardiovascular Present- Claudications. Not Present- Chest Pain, Edema, Orthopnea, Paroxysmal Nocturnal Dyspnea and Shortness of Breath. Gastrointestinal Not Present- Change in Bowel Habits, Constipation and Nausea. Neurological Not Present- Focal Neurological Symptoms. Endocrine Not Present- Appetite Changes, Cold Intolerance and Heat Intolerance. Hematology Not Present- Anemia, Petechiae and Prolonged Bleeding.  Vitals Shannon Rice; 04/08/2015 10:13 AM) 04/08/2015 10:02 AM Weight: 97.25 lb Height: 62in Body Surface Area: 1.41 m Body Mass Index: 17.79 kg/m  Pulse: 56 (Regular)  P.OX: 91% (Room air) BP: 166/68 (Sitting, Left Arm,  Standard)       Physical Exam (Bridgette Allison [name]; 04/08/2015 10:40 AM) General Mental Status-Alert. General Appearance-Cooperative, Appears stated age, Not in acute distress. Orientation-Oriented X3. Build & Nutrition-Lean and Petite.  Integumentary Problem #1 Description - Appearance - ulceration. Size - Description of size - large. Description - Characteristics - necrotic. Location - Left Lower Extremity - great toe. Response to Treatment - inadequate response to therapy. Problem #2 Description - Appearance - ulceration. Size - Description of size - small. Location - Right Upper Extremity - index finger. Problem #3 Description - Appearance -  ulceration. Size - Description of size - small. Location - Left Upper Extremity - index finger. Problem #4 Description - Appearance - ulceration. Size - Description of size - small. Location - Right Lower Extremity - fifth toe.  Head and Neck Thyroid Gland Characteristics - no palpable nodules, no palpable enlargement.  Chest and Lung Exam Palpation Tender - No chest wall tenderness. Auscultation Breath sounds - Clear. Adventitious sounds - Expiratory wheeze - Left Upper Lobe (Posterior).  Cardiovascular Inspection Jugular vein - Right - No Distention. Auscultation Heart Sounds - S1 WNL, S2 WNL and No gallop present. Murmurs & Other Heart Sounds - Murmur - No murmur.  Abdomen Palpation/Percussion Normal exam - Non Tender and No hepatosplenomegaly. Auscultation Normal exam - Bowel sounds normal.  Peripheral Vascular Lower Extremity Inspection - Bilateral - Delayed capillary refill, Digital ulcers(left 1st toe, right 5th toe), Erythematous and Shiny atrophic skin, No Pigmentation, No Varicose veins. Palpation - Edema - Bilateral - 2+ Pitting edema. Femoral pulse - Left - 2+ and Bruit. Right - 1+ and Bruit. Popliteal pulse - Left - 0+. Right - 0+. Dorsalis pedis pulse - Left - 0+. Right - 0+. Posterior tibial pulse -  Left - 0+. Right - 0+. Carotid arteries - Left-Soft bilateral carotid bruit.. Carotid arteries - Right-Soft bilateral carotid bruit.. Abdomen-Prominent abdominal aortic pulsation and Epigastric bruit present.  Neurologic Motor-Grossly intact without any focal deficits.  Musculoskeletal Global Assessment Left Lower Extremity - normal range of motion without pain. Right Lower Extremity - normal range of motion without pain.    Assessment & Plan (Bridgette [name] [name]; 04/08/2015 1:46 PM) H/O scleroderma (Z87.39) Peripheral vascular disease, secondary (I73.9) Story: Scleroderma associated small vessel disease and occlusion of the digit vessels by upper extremity angiogram 11/2014 Peripheral vascular disease of extremity (I73.9) Story: Peripheral arteriogram 02/15/2015: Peroneal runoff brisk below the knee bilateral. Severe diffuse calcification. 2 focal areas in mid SFA bilateral appear significant. PTA only if ulcer does not heal. High risk angioplasty due to calcification.  Podiatry office visit 02/07/2015: Non-pressure ulceration left great toe, debrided.  Lower extremity arterial duplex 01/19/2015: Moderate velocity increase at the right proximal superficial femoral and posterior tibial arteries of > 50%. Significant stenoses in the left external iliac artery of > 50%. This exam reveals moderately decreased perfusion of the right lower extremity with ABI 0.53 and critically decreased perfusion of the left lower extremity with ABI of 0.25, noted at the post tibial artery level. Consider further work-up. Current Plans Mechanism of underlying disease process and action of medications discussed with the patient. I discussed primary/secondary prevention and also dietary counseling was done. Patient presents for follow-up and reevaluation of bilateral leg claudication and foot ulcerations. Ulceration of left great toe initially began healing well, however has now become necrotic and  pain has become severe. I have spoken with podiatrist who states that their options have become very limited in regards to wound therapy and pain control. At this point, as pain has become debilitating and there is now evidence of limb threatening ischemia, would recommend repeat PV angiography with more aggressive attempts at angioplasty. Patient understands the risks, benefits, alternatives including medical therapy, CT angiography. Patient understands <1-2% risk of death, embolic complications, bleeding, infection, renal failure, urgent surgical revascularization, but not limited to these and wants to proceed. Daughter present at the bedside and all questions answered. Follow up after procedure.  She has severe calcific disease and probably needs atherectomy. Has non healing wound left greater toe.  *I have  discussed this case with Dr. Jacinto Halim and he personally examined the patient and participated in formulating the plan.*    Signed by Shannon Rice, [name] (04/08/2015 1:47 PM)

## 2015-04-12 ENCOUNTER — Inpatient Hospital Stay (HOSPITAL_COMMUNITY)
Admission: AD | Admit: 2015-04-12 | Discharge: 2015-04-22 | DRG: 270 | Disposition: A | Discharge status: Skilled Nursing Facility | Payer: Medicare Other | Source: Ambulatory Visit | Attending: Internal Medicine | Admitting: Internal Medicine

## 2015-04-12 ENCOUNTER — Encounter (HOSPITAL_COMMUNITY)
Admission: AD | Disposition: A | Discharge status: Skilled Nursing Facility | Payer: TRICARE For Life (TFL) | Source: Ambulatory Visit | Attending: Internal Medicine

## 2015-04-12 DIAGNOSIS — Z90710 Acquired absence of both cervix and uterus: Secondary | ICD-10-CM

## 2015-04-12 DIAGNOSIS — Z87891 Personal history of nicotine dependence: Secondary | ICD-10-CM

## 2015-04-12 DIAGNOSIS — R5381 Other malaise: Secondary | ICD-10-CM

## 2015-04-12 DIAGNOSIS — J9691 Respiratory failure, unspecified with hypoxia: Secondary | ICD-10-CM | POA: Diagnosis not present

## 2015-04-12 DIAGNOSIS — I70229 Atherosclerosis of native arteries of extremities with rest pain, unspecified extremity: Secondary | ICD-10-CM | POA: Diagnosis present

## 2015-04-12 DIAGNOSIS — S61209A Unspecified open wound of unspecified finger without damage to nail, initial encounter: Secondary | ICD-10-CM | POA: Diagnosis present

## 2015-04-12 DIAGNOSIS — I10 Essential (primary) hypertension: Secondary | ICD-10-CM | POA: Diagnosis present

## 2015-04-12 DIAGNOSIS — I739 Peripheral vascular disease, unspecified: Secondary | ICD-10-CM

## 2015-04-12 DIAGNOSIS — E860 Dehydration: Secondary | ICD-10-CM | POA: Diagnosis not present

## 2015-04-12 DIAGNOSIS — E7800 Pure hypercholesterolemia, unspecified: Secondary | ICD-10-CM | POA: Diagnosis present

## 2015-04-12 DIAGNOSIS — L97529 Non-pressure chronic ulcer of other part of left foot with unspecified severity: Secondary | ICD-10-CM | POA: Diagnosis present

## 2015-04-12 DIAGNOSIS — E785 Hyperlipidemia, unspecified: Secondary | ICD-10-CM | POA: Diagnosis present

## 2015-04-12 DIAGNOSIS — Y95 Nosocomial condition: Secondary | ICD-10-CM | POA: Diagnosis not present

## 2015-04-12 DIAGNOSIS — Z681 Body mass index (BMI) 19 or less, adult: Secondary | ICD-10-CM

## 2015-04-12 DIAGNOSIS — R109 Unspecified abdominal pain: Secondary | ICD-10-CM

## 2015-04-12 DIAGNOSIS — M79672 Pain in left foot: Secondary | ICD-10-CM

## 2015-04-12 DIAGNOSIS — M349 Systemic sclerosis, unspecified: Secondary | ICD-10-CM | POA: Diagnosis present

## 2015-04-12 DIAGNOSIS — I70262 Atherosclerosis of native arteries of extremities with gangrene, left leg: Principal | ICD-10-CM | POA: Diagnosis present

## 2015-04-12 DIAGNOSIS — B0233 Zoster keratitis: Secondary | ICD-10-CM | POA: Diagnosis present

## 2015-04-12 DIAGNOSIS — K5900 Constipation, unspecified: Secondary | ICD-10-CM | POA: Diagnosis present

## 2015-04-12 DIAGNOSIS — K219 Gastro-esophageal reflux disease without esophagitis: Secondary | ICD-10-CM | POA: Diagnosis present

## 2015-04-12 DIAGNOSIS — D638 Anemia in other chronic diseases classified elsewhere: Secondary | ICD-10-CM | POA: Diagnosis present

## 2015-04-12 DIAGNOSIS — Z7982 Long term (current) use of aspirin: Secondary | ICD-10-CM

## 2015-04-12 DIAGNOSIS — E876 Hypokalemia: Secondary | ICD-10-CM | POA: Diagnosis not present

## 2015-04-12 DIAGNOSIS — Z79899 Other long term (current) drug therapy: Secondary | ICD-10-CM

## 2015-04-12 DIAGNOSIS — E43 Unspecified severe protein-calorie malnutrition: Secondary | ICD-10-CM | POA: Insufficient documentation

## 2015-04-12 DIAGNOSIS — R7989 Other specified abnormal findings of blood chemistry: Secondary | ICD-10-CM

## 2015-04-12 DIAGNOSIS — Z7902 Long term (current) use of antithrombotics/antiplatelets: Secondary | ICD-10-CM

## 2015-04-12 DIAGNOSIS — M1990 Unspecified osteoarthritis, unspecified site: Secondary | ICD-10-CM | POA: Diagnosis present

## 2015-04-12 DIAGNOSIS — J189 Pneumonia, unspecified organism: Secondary | ICD-10-CM | POA: Diagnosis not present

## 2015-04-12 DIAGNOSIS — M869 Osteomyelitis, unspecified: Secondary | ICD-10-CM | POA: Diagnosis present

## 2015-04-12 DIAGNOSIS — R0902 Hypoxemia: Secondary | ICD-10-CM

## 2015-04-12 DIAGNOSIS — I998 Other disorder of circulatory system: Secondary | ICD-10-CM | POA: Diagnosis present

## 2015-04-12 HISTORY — PX: PERIPHERAL VASCULAR CATHETERIZATION: SHX172C

## 2015-04-12 HISTORY — DX: Peripheral vascular disease, unspecified: I73.9

## 2015-04-12 LAB — POCT ACTIVATED CLOTTING TIME
ACTIVATED CLOTTING TIME: 251 s
ACTIVATED CLOTTING TIME: 233 s
Activated Clotting Time: 356 seconds
Activated Clotting Time: 177 seconds

## 2015-04-12 LAB — BASIC METABOLIC PANEL
Anion gap: 8 (ref 5–15)
BUN: 6 mg/dL (ref 6–20)
CHLORIDE: 103 mmol/L (ref 101–111)
CO2: 27 mmol/L (ref 22–32)
CREATININE: 0.48 mg/dL (ref 0.44–1.00)
Calcium: 8.7 mg/dL — ABNORMAL LOW (ref 8.9–10.3)
GFR calc non Af Amer: >60 mL/min (ref >60)
Glucose, Bld: 83 mg/dL (ref 65–99)
Potassium: 3.3 mmol/L — ABNORMAL LOW (ref 3.5–5.1)
Sodium: 138 mmol/L (ref 135–145)

## 2015-04-12 LAB — CBC
HEMATOCRIT: 33.2 % — AB (ref 36.0–46.0)
HEMOGLOBIN: 10.3 g/dL — AB (ref 12.0–15.0)
MCH: 29.1 pg (ref 26.0–34.0)
MCHC: 31 g/dL (ref 30.0–36.0)
MCV: 93.8 fL (ref 78.0–100.0)
Platelets: 172 10*3/uL (ref 150–400)
RBC: 3.54 MIL/uL — AB (ref 3.87–5.11)
RDW: 14.5 % (ref 11.5–15.5)
WBC: 4.5 10*3/uL (ref 4.0–10.5)

## 2015-04-12 LAB — PROTIME-INR
INR: 1.16 (ref 0.00–1.49)
Prothrombin Time: 15 seconds (ref 11.6–15.2)

## 2015-04-12 SURGERY — LOWER EXTREMITY ANGIOGRAPHY
Laterality: Left

## 2015-04-12 MED ORDER — SODIUM CHLORIDE 0.9 % IJ SOLN: 3.0000 mL | INTRAMUSCULAR | Status: DC | PRN

## 2015-04-12 MED ORDER — CILOSTAZOL 50 MG PO TABS
50.0000 mg | ORAL_TABLET | Freq: Every day | ORAL | Status: AC
  Administered 2015-04-13 – 2015-04-19 (×7): 50 mg via ORAL
  Filled 2015-04-12 (×8): qty 1

## 2015-04-12 MED ORDER — HYDRALAZINE HCL 20 MG/ML IJ SOLN
10.0000 mg | INTRAMUSCULAR | Status: DC | PRN
Start: 2015-04-12 — End: 2015-04-12
  Administered 2015-04-12: 10 mg via INTRAVENOUS

## 2015-04-12 MED ORDER — HYDROMORPHONE HCL 1 MG/ML IJ SOLN
0.5000 mg | Freq: Once | INTRAMUSCULAR | Status: AC
  Administered 2015-04-12: 0.5 mg via INTRAVENOUS

## 2015-04-12 MED ORDER — AMLODIPINE BESY-BENAZEPRIL HCL 5-20 MG PO CAPS: 1.0000 | ORAL_CAPSULE | Freq: Every day | ORAL | Status: DC

## 2015-04-12 MED ORDER — SODIUM CHLORIDE 0.9 % IV SOLN: 250.0000 mL | INTRAVENOUS | Status: DC | PRN

## 2015-04-12 MED ORDER — VERAPAMIL HCL 2.5 MG/ML IV SOLN
INTRAVENOUS | Status: DC | PRN
  Administered 2015-04-12: 500 ug via INTRACORONARY
  Administered 2015-04-12: 200 ug via INTRACORONARY

## 2015-04-12 MED ORDER — DIPHENHYDRAMINE-ZINC ACETATE 1-0.1 % EX CREA: 1.0000 "application " | TOPICAL_CREAM | Freq: Three times a day (TID) | CUTANEOUS | Status: DC | PRN

## 2015-04-12 MED ORDER — ACETAMINOPHEN 500 MG PO TABS
1000.0000 mg | ORAL_TABLET | Freq: Four times a day (QID) | ORAL | Status: DC | PRN
  Administered 2015-04-13 – 2015-04-16 (×5): 1000 mg via ORAL
  Filled 2015-04-12 (×5): qty 2

## 2015-04-12 MED ORDER — HYDRALAZINE HCL 20 MG/ML IJ SOLN
INTRAMUSCULAR | Status: AC
  Filled 2015-04-12: qty 1

## 2015-04-12 MED ORDER — POTASSIUM CHLORIDE CRYS ER 20 MEQ PO TBCR
20.0000 meq | EXTENDED_RELEASE_TABLET | Freq: Once | ORAL | Status: AC
  Administered 2015-04-12: 20 meq via ORAL

## 2015-04-12 MED ORDER — VERAPAMIL HCL 2.5 MG/ML IV SOLN
INTRAVENOUS | Status: AC
  Filled 2015-04-12: qty 2

## 2015-04-12 MED ORDER — HEPARIN SODIUM (PORCINE) 1000 UNIT/ML IJ SOLN
INTRAMUSCULAR | Status: DC | PRN
  Administered 2015-04-12: 5000 [IU] via INTRAVENOUS
  Administered 2015-04-12: 2000 [IU] via INTRAVENOUS
  Administered 2015-04-12: 3000 [IU] via INTRAVENOUS

## 2015-04-12 MED ORDER — OXYCODONE-ACETAMINOPHEN 5-325 MG PO TABS
1.0000 | ORAL_TABLET | ORAL | Status: DC | PRN
  Administered 2015-04-12 – 2015-04-22 (×37): 1 via ORAL
  Filled 2015-04-12 (×38): qty 1

## 2015-04-12 MED ORDER — HYDROMORPHONE HCL 1 MG/ML IJ SOLN
INTRAMUSCULAR | Status: AC
  Filled 2015-04-12: qty 1

## 2015-04-12 MED ORDER — SODIUM CHLORIDE 0.9 % IJ SOLN
3.0000 mL | Freq: Two times a day (BID) | INTRAMUSCULAR | Status: DC
  Administered 2015-04-13: 3 mL via INTRAVENOUS

## 2015-04-12 MED ORDER — HYDROMORPHONE HCL 1 MG/ML IJ SOLN
INTRAMUSCULAR | Status: DC | PRN
  Administered 2015-04-12 (×2): 0.5 mg via INTRAVENOUS

## 2015-04-12 MED ORDER — ERYTHROMYCIN 5 MG/GM OP OINT
1.0000 "application " | TOPICAL_OINTMENT | Freq: Every day | OPHTHALMIC | Status: DC
  Administered 2015-04-12 – 2015-04-21 (×10): 1 via OPHTHALMIC
  Filled 2015-04-12 (×2): qty 3.5

## 2015-04-12 MED ORDER — HYDROMORPHONE HCL 1 MG/ML IJ SOLN
0.5000 mg | INTRAMUSCULAR | Status: DC | PRN
  Administered 2015-04-12: 17:00:00 0.5 mg via INTRAVENOUS
  Administered 2015-04-13 – 2015-04-15 (×7): 1 mg via INTRAVENOUS
  Filled 2015-04-12 (×8): qty 1

## 2015-04-12 MED ORDER — VALACYCLOVIR HCL 500 MG PO TABS
1000.0000 mg | ORAL_TABLET | Freq: Three times a day (TID) | ORAL | Status: DC
  Administered 2015-04-12 – 2015-04-17 (×14): 1000 mg via ORAL
  Filled 2015-04-12 (×19): qty 2

## 2015-04-12 MED ORDER — LABETALOL HCL 5 MG/ML IV SOLN
15.0000 mg | INTRAVENOUS | Status: DC | PRN
  Administered 2015-04-12 – 2015-04-17 (×2): 15 mg via INTRAVENOUS
  Filled 2015-04-12 (×2): qty 4

## 2015-04-12 MED ORDER — SODIUM CHLORIDE 0.9 % IV BOLUS (SEPSIS)
500.0000 mL | Freq: Once | INTRAVENOUS | Status: AC
  Administered 2015-04-12: 500 mL via INTRAVENOUS

## 2015-04-12 MED ORDER — HYDROMORPHONE HCL 1 MG/ML IJ SOLN: 1.0000 mg | INTRAMUSCULAR | Status: DC | PRN

## 2015-04-12 MED ORDER — PRAVASTATIN SODIUM 40 MG PO TABS
80.0000 mg | ORAL_TABLET | Freq: Every day | ORAL | Status: DC
Start: 2015-04-12 — End: 2015-04-22
  Administered 2015-04-14 – 2015-04-21 (×8): 80 mg via ORAL
  Filled 2015-04-12 (×11): qty 2

## 2015-04-12 MED ORDER — NITROGLYCERIN IN D5W 200-5 MCG/ML-% IV SOLN
INTRAVENOUS | Status: AC
  Filled 2015-04-12: qty 250

## 2015-04-12 MED ORDER — LABETALOL HCL 5 MG/ML IV SOLN
15.0000 mg | INTRAVENOUS | Status: DC | PRN
Start: 2015-04-12 — End: 2015-04-12

## 2015-04-12 MED ORDER — SODIUM CHLORIDE 0.9 % IV SOLN
1.0000 mL/kg/h | INTRAVENOUS | Status: AC
  Administered 2015-04-12: 18:00:00 1 mL/kg/h via INTRAVENOUS

## 2015-04-12 MED ORDER — HEPARIN (PORCINE) IN NACL 2-0.9 UNIT/ML-% IJ SOLN
INTRAMUSCULAR | Status: AC
  Filled 2015-04-12: qty 1000

## 2015-04-12 MED ORDER — METOPROLOL TARTRATE 25 MG PO TABS
25.0000 mg | ORAL_TABLET | Freq: Two times a day (BID) | ORAL | Status: DC
  Administered 2015-04-12 – 2015-04-22 (×20): 25 mg via ORAL
  Filled 2015-04-12 (×21): qty 1

## 2015-04-12 MED ORDER — HYDROMORPHONE HCL 1 MG/ML IJ SOLN
INTRAMUSCULAR | Status: AC
  Administered 2015-04-12: 0.5 mg via INTRAVENOUS
  Filled 2015-04-12: qty 1

## 2015-04-12 MED ORDER — AMLODIPINE BESYLATE 5 MG PO TABS
5.0000 mg | ORAL_TABLET | Freq: Every day | ORAL | Status: DC
  Administered 2015-04-13 – 2015-04-22 (×10): 5 mg via ORAL
  Filled 2015-04-12 (×10): qty 1

## 2015-04-12 MED ORDER — ENSURE ENLIVE PO LIQD
237.0000 mL | Freq: Two times a day (BID) | ORAL | Status: DC
  Administered 2015-04-13 – 2015-04-22 (×15): 237 mL via ORAL
  Filled 2015-04-12 (×7): qty 237

## 2015-04-12 MED ORDER — ASPIRIN EC 325 MG PO TBEC: 325.0000 mg | DELAYED_RELEASE_TABLET | Freq: Every day | ORAL | Status: DC

## 2015-04-12 MED ORDER — DIPHENHYDRAMINE-ZINC ACETATE 2-0.1 % EX CREA
TOPICAL_CREAM | Freq: Three times a day (TID) | CUTANEOUS | Status: DC | PRN
  Filled 2015-04-12 (×2): qty 28

## 2015-04-12 MED ORDER — POTASSIUM CHLORIDE CRYS ER 20 MEQ PO TBCR
EXTENDED_RELEASE_TABLET | ORAL | Status: AC
  Administered 2015-04-12: 20 meq via ORAL
  Filled 2015-04-12: qty 1

## 2015-04-12 MED ORDER — HYDRALAZINE HCL 20 MG/ML IJ SOLN
10.0000 mg | INTRAMUSCULAR | Status: DC | PRN
  Administered 2015-04-12 – 2015-04-22 (×3): 10 mg via INTRAVENOUS
  Filled 2015-04-12 (×4): qty 1

## 2015-04-12 MED ORDER — ASPIRIN EC 81 MG PO TBEC
81.0000 mg | DELAYED_RELEASE_TABLET | Freq: Every day | ORAL | Status: DC
  Administered 2015-04-13 – 2015-04-22 (×10): 81 mg via ORAL
  Filled 2015-04-12 (×10): qty 1

## 2015-04-12 MED ORDER — COLLAGENASE 250 UNIT/GM EX OINT
1.0000 "application " | TOPICAL_OINTMENT | Freq: Every day | CUTANEOUS | Status: DC
  Administered 2015-04-13 – 2015-04-20 (×6): 1 via TOPICAL
  Filled 2015-04-12 (×2): qty 30

## 2015-04-12 MED ORDER — BENAZEPRIL HCL 20 MG PO TABS
20.0000 mg | ORAL_TABLET | Freq: Every day | ORAL | Status: DC
  Administered 2015-04-13: 10:00:00 20 mg via ORAL
  Filled 2015-04-12: qty 1

## 2015-04-12 MED ORDER — IODIXANOL 320 MG/ML IV SOLN
INTRAVENOUS | Status: DC | PRN
  Administered 2015-04-12: 175 mL via INTRAVENOUS

## 2015-04-12 MED ORDER — TICAGRELOR 90 MG PO TABS
90.0000 mg | ORAL_TABLET | Freq: Two times a day (BID) | ORAL | Status: AC
  Administered 2015-04-12 – 2015-04-19 (×14): 90 mg via ORAL
  Filled 2015-04-12 (×14): qty 1

## 2015-04-12 MED ORDER — SODIUM CHLORIDE 0.9 % IV SOLN: INTRAVENOUS | Status: DC

## 2015-04-12 MED ORDER — ONDANSETRON HCL 4 MG/2ML IJ SOLN
4.0000 mg | Freq: Four times a day (QID) | INTRAMUSCULAR | Status: DC | PRN
  Administered 2015-04-12: 18:00:00 4 mg via INTRAVENOUS
  Filled 2015-04-12 (×2): qty 2

## 2015-04-12 MED ORDER — LIDOCAINE HCL (PF) 1 % IJ SOLN
INTRAMUSCULAR | Status: AC
  Filled 2015-04-12: qty 30

## 2015-04-12 SURGICAL SUPPLY — 18 items
BALLOON FOX SV 2.0X100 (BALLOONS) ×3 IMPLANT
BALLOON SABER 5.0X150X150 (BALLOONS) ×3 IMPLANT
CATH CROSS OVER TEMPO 5F (CATHETERS) ×3 IMPLANT
CATH EXTRAC PRONTO LP 6F RND (CATHETERS) ×3 IMPLANT
DIAMONDBACK SOLID OAS 1.5MM (CATHETERS) ×3
KIT ENCORE 26 ADVANTAGE (KITS) ×3 IMPLANT
KIT MICROINTRODUCER STIFF 5F (SHEATH) ×3 IMPLANT
KIT PV (KITS) ×3 IMPLANT
LUBRICANT VIPERSLIDE CORONARY (MISCELLANEOUS) ×3 IMPLANT
SHEATH FLEX ANSEL ST 6FR 45CM (SHEATH) ×3 IMPLANT
SHEATH PINNACLE 6F 10CM (SHEATH) ×3 IMPLANT
SYSTEM DIMNDBCK SLD OAS 1.5MM (CATHETERS) ×1 IMPLANT
TAPE RADIOPAQUE TURBO (MISCELLANEOUS) ×3 IMPLANT
TRANSDUCER W/STOPCOCK (MISCELLANEOUS) ×3 IMPLANT
TRAY PV CATH (CUSTOM PROCEDURE TRAY) ×3 IMPLANT
WIRE HITORQ VERSACORE ST 145CM (WIRE) ×3 IMPLANT
WIRE SPARTACORE .014X300CM (WIRE) ×3 IMPLANT
WIRE VIPER ADVANCE .017X335CM (WIRE) ×3 IMPLANT

## 2015-04-12 NOTE — Interval H&P Note (Signed)
History and Physical Interval Note:  04/12/2015 11:00 AM  Shannon Rice KARUNA BALDUCCI  has presented today for surgery, with the diagnosis of claudication  The various methods of treatment have been discussed with the patient and family. After consideration of risks, benefits and other options for treatment, the patient has consented to  Procedure(s): Lower Extremity Angiography (N/A) and possible angioplasty as a surgical intervention .  The patient's history has been reviewed, patient examined, no change in status, stable for surgery.  I have reviewed the patient's chart and labs.  Questions were answered to the patient's satisfaction.     [name]

## 2015-04-12 NOTE — Progress Notes (Signed)
Site area: right groin  Site Prior to Removal:  Level 0  Pressure Applied For 20 MINUTES    Minutes Beginning at 1740  Manual:   Yes.    Patient Status During Pull:  stable  Post Pull Groin Site:  Level 0  Post Pull Instructions Given:  Yes.    Post Pull Pulses Present:  Yes.    Dressing Applied:  Yes.    Comments:  Checked site frequently remainder of shift with no change in assessment noted, Dressing remains clean dry and intact.

## 2015-04-13 ENCOUNTER — Ambulatory Visit (HOSPITAL_COMMUNITY): Payer: Medicare Other

## 2015-04-13 ENCOUNTER — Encounter (HOSPITAL_COMMUNITY): Payer: Self-pay | Admitting: Cardiology

## 2015-04-13 DIAGNOSIS — I10 Essential (primary) hypertension: Secondary | ICD-10-CM | POA: Diagnosis not present

## 2015-04-13 DIAGNOSIS — R5381 Other malaise: Secondary | ICD-10-CM | POA: Diagnosis not present

## 2015-04-13 DIAGNOSIS — R109 Unspecified abdominal pain: Secondary | ICD-10-CM

## 2015-04-13 DIAGNOSIS — R0902 Hypoxemia: Secondary | ICD-10-CM | POA: Diagnosis not present

## 2015-04-13 DIAGNOSIS — R1013 Epigastric pain: Secondary | ICD-10-CM

## 2015-04-13 DIAGNOSIS — E876 Hypokalemia: Secondary | ICD-10-CM | POA: Diagnosis not present

## 2015-04-13 DIAGNOSIS — J189 Pneumonia, unspecified organism: Secondary | ICD-10-CM | POA: Diagnosis not present

## 2015-04-13 DIAGNOSIS — B0233 Zoster keratitis: Secondary | ICD-10-CM | POA: Diagnosis present

## 2015-04-13 DIAGNOSIS — I998 Other disorder of circulatory system: Secondary | ICD-10-CM | POA: Diagnosis not present

## 2015-04-13 DIAGNOSIS — K5900 Constipation, unspecified: Secondary | ICD-10-CM | POA: Diagnosis present

## 2015-04-13 LAB — COMPREHENSIVE METABOLIC PANEL
ALBUMIN: 2.7 g/dL — AB (ref 3.5–5.0)
ALT: 10 U/L — AB (ref 14–54)
AST: 23 U/L (ref 15–41)
Alkaline Phosphatase: 52 U/L (ref 38–126)
Anion gap: 4 — ABNORMAL LOW (ref 5–15)
BUN: 10 mg/dL (ref 6–20)
CO2: 28 mmol/L (ref 22–32)
CREATININE: 0.45 mg/dL (ref 0.44–1.00)
Calcium: 8.2 mg/dL — ABNORMAL LOW (ref 8.9–10.3)
Chloride: 104 mmol/L (ref 101–111)
Glucose, Bld: 124 mg/dL — ABNORMAL HIGH (ref 65–99)
Potassium: 3.3 mmol/L — ABNORMAL LOW (ref 3.5–5.1)
Sodium: 136 mmol/L (ref 135–145)
Total Bilirubin: 0.6 mg/dL (ref 0.3–1.2)
Total Protein: 5.6 g/dL — ABNORMAL LOW (ref 6.5–8.1)

## 2015-04-13 LAB — CBC
HCT: 28.1 % — ABNORMAL LOW (ref 36.0–46.0)
HEMOGLOBIN: 8.9 g/dL — AB (ref 12.0–15.0)
MCH: 29.7 pg (ref 26.0–34.0)
MCHC: 31.7 g/dL (ref 30.0–36.0)
MCV: 93.7 fL (ref 78.0–100.0)
Platelets: 157 10*3/uL (ref 150–400)
RBC: 3 MIL/uL — ABNORMAL LOW (ref 3.87–5.11)
RDW: 15 % (ref 11.5–15.5)
WBC: 9.9 10*3/uL (ref 4.0–10.5)

## 2015-04-13 LAB — LACTIC ACID, PLASMA: Lactic Acid, Venous: 1.2 mmol/L (ref 0.5–2.0)

## 2015-04-13 LAB — D-DIMER, QUANTITATIVE (NOT AT ARMC): D DIMER QUANT: 1.2 ug{FEU}/mL — AB (ref 0.00–0.48)

## 2015-04-13 LAB — URIC ACID: URIC ACID, SERUM: 5.6 mg/dL (ref 2.3–6.6)

## 2015-04-13 LAB — LIPASE, BLOOD: Lipase: 13 U/L — ABNORMAL LOW (ref 22–51)

## 2015-04-13 MED ORDER — SODIUM CHLORIDE 0.9 % IV SOLN: INTRAVENOUS | Status: DC

## 2015-04-13 MED ORDER — POLYETHYLENE GLYCOL 3350 17 G PO PACK
17.0000 g | PACK | Freq: Every day | ORAL | Status: DC
  Administered 2015-04-14 – 2015-04-18 (×3): 17 g via ORAL
  Filled 2015-04-13 (×5): qty 1

## 2015-04-13 MED ORDER — VANCOMYCIN HCL IN DEXTROSE 750-5 MG/150ML-% IV SOLN
750.0000 mg | INTRAVENOUS | Status: DC
  Administered 2015-04-13 – 2015-04-18 (×6): 750 mg via INTRAVENOUS
  Filled 2015-04-13 (×6): qty 150

## 2015-04-13 MED ORDER — SODIUM CHLORIDE 0.9 % IV SOLN
INTRAVENOUS | Status: AC
  Administered 2015-04-13 – 2015-04-14 (×3): via INTRAVENOUS
  Filled 2015-04-13 (×3): qty 1000

## 2015-04-13 MED ORDER — ASPIRIN 81 MG PO TBEC: 81.0000 mg | DELAYED_RELEASE_TABLET | Freq: Every day | ORAL | Status: DC

## 2015-04-13 MED ORDER — DOCUSATE SODIUM 100 MG PO CAPS
100.0000 mg | ORAL_CAPSULE | Freq: Two times a day (BID) | ORAL | Status: DC
Start: 2015-04-13 — End: 2015-04-20
  Administered 2015-04-14 – 2015-04-20 (×11): 100 mg via ORAL
  Filled 2015-04-13 (×13): qty 1

## 2015-04-13 MED ORDER — BENAZEPRIL HCL 10 MG PO TABS
10.0000 mg | ORAL_TABLET | Freq: Every day | ORAL | Status: DC
  Administered 2015-04-14 – 2015-04-22 (×9): 10 mg via ORAL
  Filled 2015-04-13 (×9): qty 1

## 2015-04-13 MED ORDER — ONDANSETRON 4 MG PO TBDP: 4.0000 mg | ORAL_TABLET | Freq: Three times a day (TID) | ORAL | Status: AC | PRN

## 2015-04-13 MED ORDER — TICAGRELOR 90 MG PO TABS
90.0000 mg | ORAL_TABLET | Freq: Two times a day (BID) | ORAL | Status: DC
Start: 2015-04-13 — End: 2015-04-22

## 2015-04-13 MED ORDER — PIPERACILLIN-TAZOBACTAM 3.375 G IVPB
3.3750 g | Freq: Three times a day (TID) | INTRAVENOUS | Status: DC
  Administered 2015-04-13 – 2015-04-21 (×22): 3.375 g via INTRAVENOUS
  Filled 2015-04-13 (×25): qty 50

## 2015-04-13 MED ORDER — POTASSIUM CHLORIDE IN NACL 20-0.9 MEQ/L-% IV SOLN
INTRAVENOUS | Status: DC
  Administered 2015-04-13: 17:00:00 via INTRAVENOUS

## 2015-04-13 MED FILL — Nitroglycerin IV Soln 200 MCG/ML in D5W: INTRAVENOUS | Qty: 250 | Status: AC

## 2015-04-13 MED FILL — Lidocaine HCl Local Preservative Free (PF) Inj 1%: INTRAMUSCULAR | Qty: 30 | Status: AC

## 2015-04-13 NOTE — Progress Notes (Signed)
Dr. Nadara Eaton notified of patients complaints of having no feeling in the toes of her right foot. Doppler of marked pulse in lateral area by ankle was difficult to detect and unable to be sure of a dopplered pulse in that area. Vascular duplex being done at bedside on lower extremities at this time. Dr. Nadara Eaton said he would follow up and look at the duplex when it was completed. No orders at this time.

## 2015-04-13 NOTE — Progress Notes (Signed)
*  PRELIMINARY RESULTS* Vascular Ultrasound Lower extremity venous duplex has been completed.  Preliminary findings: No evidence of DVT or baker's cyst.   Farrel Demark, RDMS, RVT  04/13/2015, 3:47 PM

## 2015-04-13 NOTE — Progress Notes (Signed)
CM with pt /daughter(Vickie) regarding Brilinta and provided pt with booklet with 30 day free card enclosed. CM explained card usage to pt/daughter with  verbal understanding from daughter noted. Benefits check: Pt copay will be $24- prior auth is not required. Pt already takes this med- this will be her last purchase from retail before she has to start mail order- her mail order amount will be $20. She is only allowed to purchase this med 2x from retail. CM shared information with pt/daughter. No other needs identified per CM. Gae Gallop RN,BSN,CM [phone]

## 2015-04-13 NOTE — Discharge Summary (Signed)
Physician Discharge Summary  Patient ID: Shannon Rice MRN: [id number] DOB/AGE: Nov 15, 1930 81 y.o.  Admit date: 04/12/2015 Discharge date: 04/13/2015   NOTE: This is a discharge summary from my service to Hospitalist service and patient has other medical issues including fever, cough, dyspnea and hypoxemia.  Primary Discharge Diagnosis: PAD  Secondary Discharge Diagnosis: 1. Limb threatening ischemia 2. Benign essential HTN 3. Scleroderma 4. Hyperlipidemia  Significant Diagnostic Studies: PV angiogram 04/12/2015: Bilateral common femoral artery showed heavy calcification, right is worse than the left, but patent. Right common femoral artery has calcific 80% stenosis. Left femoral artery with distal runoff: Severe calcific disease throughout. Mid-segment of the left SFA shows tandem 80% stenosis which is diffuse and calcified. Popliteal artery shows mild calcification but no high-grade stenosis. Both AT and PT are occluded with single-vessel runoff in the form of peroneal artery. Distal peroneal artery shows progression of vascular disease with diffuse 80-90% stenosis, compared to angiogram 2 months ago. However, bisk runoff was evident.   successful PTA and atherectomy with a 1.5 mm Solid Crown CSI device followed by Balloon angioplasty with a 5.0 x 150 mm saber balloon, stenosis reduced from 80% to less than 10% and thrombectomy of the left peroneal artery with Pronto followed by balloon angioplasty of the left peroneal artery with 2.0x156mm Foxcross balloon, 100% reduced to 0%.     Hospital Course:  Pt is an 80 year old A.A female with a PMH of scleroderma, hyperlipidemia, hypertension, and recent history of tobacco use who we have been seeing for PAD.  She underwent peripheral arteriogram on 02/15/2015 revealing severely calcified severely diffusely diseased vessels, however there was still brisk blood flow to her foot, and medical therapy was recommended as she was extremely high  risk for complications for percutaneous revascularization  Her symptoms had initially begun to improve, however, we received a phone call from her podiatrist recently stating that wounds had become resistant to therapy and her pain was becoming severe. They requested she be reevaluated and exam revealed necrotic left great toe. As pain had become debilitating and there is now evidence of limb threatening ischemia, repeat PV angiography was recommended with more aggressive attempts at angioplasty. On 04/12/2015, she underwent successful PTA and atherectomy with a 1.5 mm Solid Crown CSI device followed by Balloon angioplasty with a 5.0 x 150 mm saber balloon, stenosis reduced from 80% to less than 10% and thrombectomy of the left peroneal artery with Pronto followed by balloon angioplasty of the left peroneal artery with 2.0x164mm Foxcross balloon, 100% reduced to 0%.    This morning, she denies any lower extremity pain, however, reports nausea, likely due to IV pain medication.  Zofran given.   Due to fever, persistant hypoxemia probably due to underlying copd, h/o tobacco use disorder, and systemic sclerosis, Hospitalist service was consulted who agreed to take over her care due to medical issues.   She may need amputation of the left great toe. Evaluation for osteomylelitis.  Recommendations on discharge: Begin Brilinta for signficant PAD in addition to 81mg  ASA and Pletal.  Will continue for at least 1-2 months and discontinue Brilinta. Continue  ASA and Pletal at that time.  Follow up outpatient in 1 week as scheduled and will obtain ABI at OP visit.    Discharge Exam: Blood pressure 147/37, pulse 87, temperature 99.3 F (37.4 C), temperature source Oral, resp. rate 19, height 5\' 2"  (1.575 m), weight 43 kg (94 lb 12.8 oz), SpO2 92 %.    General appearance:  alert, cooperative, appears stated age and fatigued Resp: clear to auscultation bilaterally Chest wall: no tenderness Cardio: regular rate and  rhythm, S1, S2 normal, no murmur, click, rub or gallop Extremities: no edema, redness or tenderness in the calves or thighs Pulses: Right Pulses: FEM: present 2+ and bruit, POP: absent, DP: absent, PT: doppler Left Pulses: FEM: present 2+ and and bruit, POP: absent, DP: absent, PT: doppler, Peroneal Doppler positive. Skin: cool lower extremities, bilateral foot cool. Left greater toe appears gangrenous and dry compared to wet gangrene 2 days ago. Incision/Wound: Left great toe necrotic  Labs:   Lab Results  Component Value Date   WBC 4.5 04/12/2015   HGB 10.3* 04/12/2015   HCT 33.2* 04/12/2015   MCV 93.8 04/12/2015   PLT 172 04/12/2015    Recent Labs Lab 04/12/15 0945  NA 138  K 3.3*  CL 103  CO2 27  BUN 6  CREATININE 0.48  CALCIUM 8.7*  GLUCOSE 83    FOLLOW UP PLANS AND APPOINTMENTS Discharge Instructions    Discharge patient    Complete by:  As directed             Medication List    TAKE these medications        acetaminophen 500 MG tablet  Commonly known as:  TYLENOL  Take 1,000 mg by mouth every 6 (six) hours as needed for moderate pain or headache.     amLODipine-benazepril 5-20 MG capsule  Commonly known as:  LOTREL  Take 1 capsule by mouth daily.     aspirin 81 MG EC tablet  Take 1 tablet (81 mg total) by mouth daily.     cilostazol 50 MG tablet  Commonly known as:  PLETAL  Take 50 mg by mouth daily.     diphenhydrAMINE-zinc acetate cream  Commonly known as:  BENADRYL  Apply 1 application topically 3 (three) times daily as needed for itching.     erythromycin ophthalmic ointment  Place 1 application into both eyes at bedtime.     HYDROcodone-acetaminophen 5-325 MG tablet  Commonly known as:  NORCO  Take 1 tablet by mouth every 6 (six) hours as needed for moderate pain.     ibuprofen 200 MG tablet  Commonly known as:  ADVIL,MOTRIN  Take 400 mg by mouth every 6 (six) hours as needed for headache or moderate pain.     LIVALO 2 MG Tabs   Generic drug:  Pitavastatin Calcium  Take 2 mg by mouth 2 (two) times daily.     metoprolol tartrate 25 MG tablet  Commonly known as:  LOPRESSOR  Take 25 mg by mouth 2 (two) times daily.     ondansetron 4 MG disintegrating tablet  Commonly known as:  ZOFRAN ODT  Take 1 tablet (4 mg total) by mouth every 8 (eight) hours as needed for nausea or vomiting.     oxyCODONE-acetaminophen 5-325 MG tablet  Commonly known as:  PERCOCET/ROXICET  Take 1 tablet by mouth every 4 (four) hours as needed.     SANTYL ointment  Generic drug:  collagenase  Apply 1 application topically daily at 12 noon.     ticagrelor 90 MG Tabs tablet  Commonly known as:  BRILINTA  Take 1 tablet (90 mg total) by mouth 2 (two) times daily.     valACYclovir 1000 MG tablet  Commonly known as:  VALTREX  Take 1,000 mg by mouth 3 (three) times daily.           Follow-up  Information    Follow up with [name], MD On 04/21/2015.   Specialty:  Cardiology   Why:  at 12:00pm   Contact information:   [address] [place] [address] [phone]        Erling Conte, NP-C 04/13/2015, 9:51 AM [facility] Pager: [phone] Office: [phone]

## 2015-04-13 NOTE — Progress Notes (Signed)
Patient complains of weakness and nausea this morning. Overnight had slight elevation in temperature (100.2 at 333), given tylenol. Sleepy and stated she cannot eat breakfast. Patient given zofran 4 mg for complaints of nausea. Resting quietly. Noted oxygen saturation in the 70's on room air. 5 liters oxygen via nasal cannula required for oxygen saturation of 90-91%. Patient sleeping. Patient stated she felt better after zofran and sleeping, given pills in applesauce 1-2 at a time with breaks to ensure tolerating. No further nausea but unable to eat anything, tolerating water only at this time. Denies shortness of breath or pain. Dr. Nadara Eaton notified of patients condition and oxygen requirement. Calling hospitalist for consult.

## 2015-04-13 NOTE — Consult Note (Signed)
[facility] Medical Consultation  Shannon Rice UJW:[id number] DOB: 07-01-1931 DOA: 04/12/2015 PCP: Ralene Ok, MD   Requesting physician: [name], MD Date of consultation: 04/13/15 Reason for consultation: fever, hypoxia, nausea  Impression/Recommendations  1. Postprocedural fever. No specific complaints other than right ankle pain and left great toe pain (necrotic lesion).   Check blood cultures  urinalysis  CXR  CBC, CMET, lactic acid uric acid level   She appears dehydrated on exam. Start IVF at 153ml/hr for 24 hours.  2. Hypoxia. Spoke with nursing staff. Patient's 02 sats fell from 100% on 2 Liters to the 80's early this am. Sats have improved to 92%. Rule out pneumonia, doubt volume overload, rule out PE. Obtain CXR, d-dimer, ultrasound of lower extremities.    3. HTN, stable Continue home anti-hypertensives  4. Hypokalemia. Will replete, add K to IVF. Check am BMET  5. Herpes zoster keratoconjunctivitis. On valtrex  6. Critical lower limb ischemia, patient is s/p revascularization of / thrombectomy of the left peroneal artery yesterday.  On Pletal at home, Brilinta just added. .   7. Weight loss. Nutritionist seeing now.She has lost approx 15 pounds over last several months.   8. Constipation. Start stool softeners. Will try daily miralax.   I will followup again tomorrow. Please contact me if I can be of assistance in the meanwhile. Thank you for this consultation.  Chief Complaint: nauseated, feels bad  HPI:  Patient is an 81 year old female with scleroderma, hyperlipidemia, HTN and severe peripheral artery disease. She underwent balloon angioplasty of left perineal artery yesterday. Around 3:30 AM this morning patient began running low-grade temps. Her oxygen saturation fell into 80's on 2 L per [place]. After increasing oxygen to 4-5 liters, sats improved 94-100%. Patient complains of left great toe pain. She developed nausea this am. Feels poor in  general . Appetite is poor, she has lost several pounds over last several months.   Review of Systems:  All systems reviewed and negative except where noted in HPI  Past Medical History  Diagnosis Date  . Hypertension   . GERD (gastroesophageal reflux disease)   . Lupus (HCC)     skin, scleraderma  . Scleroderma (HCC)   . Arthritis     scleroderma  . Peripheral vascular disease [facility])    Past Surgical History  Procedure Laterality Date  . Abdominal hysterectomy    . Coronary angioplasty      Dr [name]  . Sympathectomy Right 01/27/2015    Procedure: RIGHT RADICAL ARTERY SYMPATHECTOMY, RIGHT ULNAR ARTERY SYMPATHECTOMY, DIGITAL SYMPATHECTOMY RIGHT INDEX, MIDDLE, RING SMALL;  Surgeon: [name], MD;  Location: [facility];  Service: Orthopedics;  Laterality: Right;  . Amputation Right 01/27/2015    Procedure: AMPUTATION DISTAL TIP RIGHT INDEX FINGER;  Surgeon: [name], MD;  Location: [facility];  Service: Orthopedics;  Laterality: Right;  . Peripheral vascular catheterization N/A 02/15/2015    Procedure: Lower Extremity Angiography;  Surgeon: [name], MD;  Location: [facility];  Service: Cardiovascular;  Laterality: N/A;  . Peripheral vascular catheterization Left 04/12/2015    Procedure: Lower Extremity Angiography;  Surgeon: [name], MD;  Location: [facility] INVASIVE CV LAB;  Service: Cardiovascular;  Laterality: Left;  . Peripheral vascular catheterization Left 04/12/2015    Procedure: Peripheral Vascular Atherectomy;  Surgeon: [name], MD;  Location: [facility] INVASIVE CV LAB;  Service: Cardiovascular;  Laterality: Left;  sfa      Social History:  reports that she quit smoking about  4 months ago. Her smoking use included Cigarettes. She has a 15 pack-year smoking history. She has never used smokeless tobacco. She reports that she drinks alcohol. She reports that she does not use illicit drugs.  No Known Allergies Family History  Problem Relation Age of  Onset  . Diabetes Sister   . Diabetes Brother     Prior to Admission medications   Medication Sig Start Date End Date Taking? Authorizing Provider  acetaminophen (TYLENOL) 500 MG tablet Take 1,000 mg by mouth every 6 (six) hours as needed for moderate pain or headache.   Yes Historical Provider, MD  amLODipine-benazepril (LOTREL) 5-20 MG per capsule Take 1 capsule by mouth daily.   Yes Historical Provider, MD  aspirin EC 325 MG tablet Take 325 mg by mouth daily.   Yes Historical Provider, MD  cilostazol (PLETAL) 50 MG tablet Take 50 mg by mouth daily. 02/25/15  Yes Historical Provider, MD  diphenhydrAMINE-zinc acetate (BENADRYL) cream Apply 1 application topically 3 (three) times daily as needed for itching.   Yes Historical Provider, MD  erythromycin ophthalmic ointment Place 1 application into both eyes at bedtime. 03/31/15  Yes Historical Provider, MD  metoprolol tartrate (LOPRESSOR) 25 MG tablet Take 25 mg by mouth 2 (two) times daily.   Yes Historical Provider, MD  oxyCODONE-acetaminophen (PERCOCET/ROXICET) 5-325 MG tablet Take 1 tablet by mouth every 4 (four) hours as needed. 04/05/15  Yes Historical Provider, MD  Pitavastatin Calcium (LIVALO) 2 MG TABS Take 2 mg by mouth 2 (two) times daily.   Yes Historical Provider, MD  SANTYL ointment Apply 1 application topically daily at 12 noon. 12/22/14  Yes Historical Provider, MD  valACYclovir (VALTREX) 1000 MG tablet Take 1,000 mg by mouth 3 (three) times daily. 03/31/15  Yes Historical Provider, MD  aspirin EC 81 MG EC tablet Take 1 tablet (81 mg total) by mouth daily. 04/13/15   Shannon Salvo, NP  HYDROcodone-acetaminophen (NORCO) 5-325 MG per tablet Take 1 tablet by mouth every 6 (six) hours as needed for moderate pain. Patient not taking: Reported on 04/11/2015 01/27/15   [name], MD  ibuprofen (ADVIL,MOTRIN) 200 MG tablet Take 400 mg by mouth every 6 (six) hours as needed for headache or moderate pain.    Historical Provider, MD   ondansetron (ZOFRAN ODT) 4 MG disintegrating tablet Take 1 tablet (4 mg total) by mouth every 8 (eight) hours as needed for nausea or vomiting. 04/13/15   Shannon Salvo, NP  Pitavastatin Calcium 4 MG TABS Take 2 mg by mouth 2 (two) times daily.    Historical Provider, MD  ticagrelor (BRILINTA) 90 MG TABS tablet Take 1 tablet (90 mg total) by mouth 2 (two) times daily. 04/13/15   Shannon Salvo, NP   Physical Exam: Blood pressure 147/37, pulse 82, temperature 99 F (37.2 C), temperature source Oral, resp. rate 22, height 5\' 2"  (1.575 m), weight 43 kg (94 lb 12.8 oz), SpO2 92 %. Filed Vitals:   04/13/15 1238  BP:   Pulse:   Temp: 99 F (37.2 C)  Resp:      General:  Thin pleasant black female in NAD  Eyes: PER, conjunctive pale, no icterus.   ENT:  Tongue dry  Neck: supple, no masses  Cardiovascular: slightly tachy at 104 while up to bathroom. Regular            rhythm  Respiratory: Respirations even and unlabored. O2 per [place] at 4-5  liters.  LLL inspiratory crackles.    Abdomen: Soft, nontender, nondistended, active bowel sounds.   Skin: warm, dry  Extremities: underside of the left great toe there is a large black,        necrotic area. Feet / toes warm, faint pedal pulses.   Musculoskeletal: good strength in bilateral upper and lower                          extremities.   Psychiatric: plesant, cooperative.   Neurologic: alert and oriented.   Labs on Admission:  Basic Metabolic Panel:  Recent Labs Lab 04/12/15 0945  NA 138  K 3.3*  CL 103  CO2 27  GLUCOSE 83  BUN 6  CREATININE 0.48  CALCIUM 8.7*   CBC:  Recent Labs Lab 04/12/15 0945  WBC 4.5  HGB 10.3*  HCT 33.2*  MCV 93.8  PLT 172    EKG: last one was July 2016: Unusual P axis, possible ectopic atrial rhythm Left axis deviation Minimal voltage criteria for LVH, may be normal variant T wave abnormality, consider lateral ischemia Abnormal ECG  Time spent: 60  minutes  Willette Cluster NP [facility] Pager [phone]  If 7PM-7AM, please contact night-coverage [email or web address] Password [email or web address] 04/13/2015, 1:23 PM

## 2015-04-13 NOTE — Progress Notes (Signed)
Initial Nutrition Assessment  DOCUMENTATION CODES:   Severe malnutrition in context of chronic illness  INTERVENTION:   Ensure Enlive po BID, each supplement provides 350 kcal and 20 grams of protein (Strawberry) Magic cup TID with meals, each supplement provides 290 kcal and 9 grams of protein  NUTRITION DIAGNOSIS:   Malnutrition related to chronic illness as evidenced by severe depletion of body fat, severe depletion of muscle mass.  GOAL:   Patient will meet greater than or equal to 90% of their needs  MONITOR:   PO intake, Supplement acceptance, Labs, Skin, I & O's  REASON FOR ASSESSMENT:   Malnutrition Screening Tool   ASSESSMENT:   Patient is an 81 year old female with scleroderma, hyperlipidemia, HTN and severe peripheral artery disease. She underwent balloon angioplasty of left perineal artery 10/4.   Pt lives alone, sleepy during visit. RN in room as O2 sats were reading low. She is currently on 5L via [place]. Daughter at bedside. Per daughter pt is very independent and does not want to move in with daughter. Daughter verbalizes that pt can stay with her as long as she needs.  Recall reviewed:  Breakfast: 0 Lunch: coffee cake and coffee Dinner: 1/2 of peanut butter sandwich or pimento sandwich Pt eats all meals at home alone.  Pt drinks up to 1 Boost per day. (Strawberry) Pt complains that it is hard for her to chew meats and they just get bigger in her mouth as she chews.  Lunch at bedside, pt only ate 3 mandarin orange slices.   Per daughter pt has been losing weight for about 2 years now, has been gradual. Pt states she has been unable to do her ADLs at home for the last year. She feels weak and her toe has been very painful.  CBG's: 177-356 Discussed with RN and case Production designer, theatre/television/film.  Educated daughter on protein sources, importance of small frequent meals/snacks, and importance of oral nutrition supplements.   Diet Order:  Diet Heart Room service appropriate?: Yes;  Fluid consistency:: Thin  Skin:  Wound (see comment) (Pressure ulcer right toe)  Last BM:  unknown  Height:   Ht Readings from Last 1 Encounters:  04/12/15  (1.575 m)   Weight:   Wt Readings from Last 1 Encounters:  04/13/15 94 lb 12.8 oz (43 kg)   Ideal Body Weight:  50 kg  BMI:  Body mass index is 17.33 kg/(m^2).  Estimated Nutritional Needs:   Kcal:  1200-1400  Protein:  60-70 grams  Fluid:  > 1.5 L/day  EDUCATION NEEDS:   No education needs identified at this time  Kendell Bane RD, LDN, CNSC [phone] Pager [phone] After Hours Pager

## 2015-04-13 NOTE — Progress Notes (Signed)
ANTIBIOTIC CONSULT NOTE - INITIAL  Pharmacy Consult for Vancomycin and Zosyn Indication: rule out sepsis and osteomyelitis  No Known Allergies  Patient Measurements: Height:  (157.5 cm) Weight: 94 lb 12.8 oz (43 kg) IBW/kg (Calculated) : 50.1 Adjusted Body Weight:   Vital Signs: Temp: 97.5 F (36.4 C) (10/05 1556) Temp Source: Oral (10/05 1556) BP: 131/51 mmHg (10/05 1556) Pulse Rate: 99 (10/05 1556) Intake/Output from previous day: 10/04 0701 - 10/05 0700 In: 291.7 [P.O.:120; I.V.:171.7] Out: 1900 [Urine:1900] Intake/Output from this shift:    Labs:  Recent Labs  04/12/15 0945 04/13/15 1515  WBC 4.5 9.9  HGB 10.3* 8.9*  PLT 172 157  CREATININE 0.48  --    Estimated Creatinine Clearance: 36.8 mL/min (by C-G formula based on Cr of 0.48). No results for input(s): VANCOTROUGH, VANCOPEAK, VANCORANDOM, GENTTROUGH, GENTPEAK, GENTRANDOM, TOBRATROUGH, TOBRAPEAK, TOBRARND, AMIKACINPEAK, AMIKACINTROU, AMIKACIN in the last 72 hours.   Microbiology: No results found for this or any previous visit (from the past 720 hour(s)).  Medical History: Past Medical History  Diagnosis Date  . Hypertension   . GERD (gastroesophageal reflux disease)   . Lupus (HCC)     skin, scleraderma  . Scleroderma (HCC)   . Arthritis     scleroderma  . Peripheral vascular disease (HCC)     Medications:  Prescriptions prior to admission  Medication Sig Dispense Refill Last Dose  . acetaminophen (TYLENOL) 500 MG tablet Take 1,000 mg by mouth every 6 (six) hours as needed for moderate pain or headache.   Past Week at Unknown time  . amLODipine-benazepril (LOTREL) 5-20 MG per capsule Take 1 capsule by mouth daily.   04/12/2015 at Unknown time  . aspirin EC 325 MG tablet Take 325 mg by mouth daily.   04/12/2015 at Unknown time  . cilostazol (PLETAL) 50 MG tablet Take 50 mg by mouth daily.  0 04/11/2015 at Unknown time  . diphenhydrAMINE-zinc acetate (BENADRYL) cream Apply 1 application  topically 3 (three) times daily as needed for itching.   04/11/2015 at Unknown time  . erythromycin ophthalmic ointment Place 1 application into both eyes at bedtime.  0 Past Week at Unknown time  . metoprolol tartrate (LOPRESSOR) 25 MG tablet Take 25 mg by mouth 2 (two) times daily.   04/12/2015 at 0700  . oxyCODONE-acetaminophen (PERCOCET/ROXICET) 5-325 MG tablet Take 1 tablet by mouth every 4 (four) hours as needed.  0 04/12/2015 at 0200  . Pitavastatin Calcium (LIVALO) 2 MG TABS Take 2 mg by mouth 2 (two) times daily.   Past Week at Unknown time  . SANTYL ointment Apply 1 application topically daily at 12 noon.  0 Past Week at Unknown time  . valACYclovir (VALTREX) 1000 MG tablet Take 1,000 mg by mouth 3 (three) times daily.  0 04/11/2015 at Unknown time  . HYDROcodone-acetaminophen (NORCO) 5-325 MG per tablet Take 1 tablet by mouth every 6 (six) hours as needed for moderate pain. (Patient not taking: Reported on 04/11/2015) 30 tablet 0 02/15/2015 at 0330  . ibuprofen (ADVIL,MOTRIN) 200 MG tablet Take 400 mg by mouth every 6 (six) hours as needed for headache or moderate pain.   02/12/2015  . Pitavastatin Calcium 4 MG TABS Take 2 mg by mouth 2 (two) times daily.   02/12/2015   Scheduled:  . amLODipine  5 mg Oral Daily  . aspirin EC  81 mg Oral Daily  . benazepril  20 mg Oral Daily  . cilostazol  50 mg Oral Daily  . collagenase  1 application Topical Q1200  . docusate sodium  100 mg Oral BID  . erythromycin  1 application Both Eyes QHS  . feeding supplement (ENSURE ENLIVE)  237 mL Oral BID BM  . metoprolol tartrate  25 mg Oral BID  . piperacillin-tazobactam (ZOSYN)  IV  3.375 g Intravenous Q8H  . polyethylene glycol  17 g Oral Daily  . pravastatin  80 mg Oral q1800  . ticagrelor  90 mg Oral BID  . valACYclovir  1,000 mg Oral TID   Infusions:  . sodium chloride 0.9 % 1,000 mL with potassium chloride 20 mEq infusion 100 mL/hr at 04/13/15 1535   Assessment: 81yo female with history of scleroderma,  PVD and HLD was here for peripheral arteriogram. Pharmacy is consulted to dose vancomycin and zosyn for suspected sepsis and osteomyelitis. Pt is afebrile, WBC wnl, sCr 0.48.  Goal of Therapy:  Vancomycin trough level 15-20 mcg/ml  Plan:  Vancomycin  IV q24h Zosyn 3.375g IV q8h Measure antibiotic drug levels at steady state Follow up culture results, renal function and clinical course  Arlean Hopping. Newman Pies, PharmD Clinical Pharmacist Pager [phone] 04/13/2015,4:06 PM

## 2015-04-14 DIAGNOSIS — R109 Unspecified abdominal pain: Secondary | ICD-10-CM | POA: Diagnosis not present

## 2015-04-14 DIAGNOSIS — I509 Heart failure, unspecified: Secondary | ICD-10-CM | POA: Diagnosis not present

## 2015-04-14 DIAGNOSIS — M86172 Other acute osteomyelitis, left ankle and foot: Secondary | ICD-10-CM | POA: Diagnosis not present

## 2015-04-14 DIAGNOSIS — E43 Unspecified severe protein-calorie malnutrition: Secondary | ICD-10-CM | POA: Diagnosis present

## 2015-04-14 DIAGNOSIS — M8618 Other acute osteomyelitis, other site: Secondary | ICD-10-CM | POA: Diagnosis not present

## 2015-04-14 DIAGNOSIS — I10 Essential (primary) hypertension: Secondary | ICD-10-CM | POA: Diagnosis not present

## 2015-04-14 DIAGNOSIS — L97529 Non-pressure chronic ulcer of other part of left foot with unspecified severity: Secondary | ICD-10-CM | POA: Diagnosis present

## 2015-04-14 DIAGNOSIS — S61209A Unspecified open wound of unspecified finger without damage to nail, initial encounter: Secondary | ICD-10-CM | POA: Diagnosis present

## 2015-04-14 DIAGNOSIS — R0902 Hypoxemia: Secondary | ICD-10-CM | POA: Diagnosis not present

## 2015-04-14 DIAGNOSIS — M869 Osteomyelitis, unspecified: Secondary | ICD-10-CM | POA: Diagnosis present

## 2015-04-14 DIAGNOSIS — I70262 Atherosclerosis of native arteries of extremities with gangrene, left leg: Secondary | ICD-10-CM | POA: Diagnosis present

## 2015-04-14 DIAGNOSIS — R5082 Postprocedural fever: Secondary | ICD-10-CM | POA: Diagnosis not present

## 2015-04-14 DIAGNOSIS — K5909 Other constipation: Secondary | ICD-10-CM | POA: Diagnosis not present

## 2015-04-14 DIAGNOSIS — J9691 Respiratory failure, unspecified with hypoxia: Secondary | ICD-10-CM | POA: Diagnosis not present

## 2015-04-14 DIAGNOSIS — E876 Hypokalemia: Secondary | ICD-10-CM | POA: Diagnosis not present

## 2015-04-14 DIAGNOSIS — B0233 Zoster keratitis: Secondary | ICD-10-CM | POA: Diagnosis present

## 2015-04-14 DIAGNOSIS — K5900 Constipation, unspecified: Secondary | ICD-10-CM | POA: Diagnosis not present

## 2015-04-14 DIAGNOSIS — Z90710 Acquired absence of both cervix and uterus: Secondary | ICD-10-CM | POA: Diagnosis not present

## 2015-04-14 DIAGNOSIS — K219 Gastro-esophageal reflux disease without esophagitis: Secondary | ICD-10-CM | POA: Diagnosis present

## 2015-04-14 DIAGNOSIS — M349 Systemic sclerosis, unspecified: Secondary | ICD-10-CM | POA: Diagnosis present

## 2015-04-14 DIAGNOSIS — Z7902 Long term (current) use of antithrombotics/antiplatelets: Secondary | ICD-10-CM | POA: Diagnosis not present

## 2015-04-14 DIAGNOSIS — Z87891 Personal history of nicotine dependence: Secondary | ICD-10-CM | POA: Diagnosis not present

## 2015-04-14 DIAGNOSIS — K5901 Slow transit constipation: Secondary | ICD-10-CM | POA: Diagnosis not present

## 2015-04-14 DIAGNOSIS — J189 Pneumonia, unspecified organism: Secondary | ICD-10-CM | POA: Diagnosis not present

## 2015-04-14 DIAGNOSIS — E7800 Pure hypercholesterolemia, unspecified: Secondary | ICD-10-CM | POA: Diagnosis present

## 2015-04-14 DIAGNOSIS — Z7982 Long term (current) use of aspirin: Secondary | ICD-10-CM | POA: Diagnosis not present

## 2015-04-14 DIAGNOSIS — Z79899 Other long term (current) drug therapy: Secondary | ICD-10-CM | POA: Diagnosis not present

## 2015-04-14 DIAGNOSIS — E860 Dehydration: Secondary | ICD-10-CM | POA: Diagnosis not present

## 2015-04-14 DIAGNOSIS — M1990 Unspecified osteoarthritis, unspecified site: Secondary | ICD-10-CM | POA: Diagnosis present

## 2015-04-14 DIAGNOSIS — Z681 Body mass index (BMI) 19 or less, adult: Secondary | ICD-10-CM | POA: Diagnosis not present

## 2015-04-14 DIAGNOSIS — Y95 Nosocomial condition: Secondary | ICD-10-CM | POA: Diagnosis not present

## 2015-04-14 DIAGNOSIS — E785 Hyperlipidemia, unspecified: Secondary | ICD-10-CM | POA: Diagnosis present

## 2015-04-14 DIAGNOSIS — D638 Anemia in other chronic diseases classified elsewhere: Secondary | ICD-10-CM | POA: Diagnosis present

## 2015-04-14 DIAGNOSIS — I739 Peripheral vascular disease, unspecified: Secondary | ICD-10-CM | POA: Diagnosis present

## 2015-04-14 DIAGNOSIS — I998 Other disorder of circulatory system: Secondary | ICD-10-CM | POA: Diagnosis not present

## 2015-04-14 LAB — URINALYSIS W MICROSCOPIC (NOT AT ARMC)
BILIRUBIN URINE: NEGATIVE
Glucose, UA: NEGATIVE mg/dL
Hgb urine dipstick: NEGATIVE
KETONES UR: NEGATIVE mg/dL
LEUKOCYTES UA: NEGATIVE
NITRITE: NEGATIVE
PH: 5 (ref 5.0–8.0)
Protein, ur: NEGATIVE mg/dL
Specific Gravity, Urine: 1.025 (ref 1.005–1.030)
Urobilinogen, UA: 1 mg/dL (ref 0.0–1.0)

## 2015-04-14 LAB — BASIC METABOLIC PANEL
ANION GAP: 6 (ref 5–15)
BUN: 12 mg/dL (ref 6–20)
CHLORIDE: 101 mmol/L (ref 101–111)
CO2: 28 mmol/L (ref 22–32)
Calcium: 7.9 mg/dL — ABNORMAL LOW (ref 8.9–10.3)
Creatinine, Ser: 0.54 mg/dL (ref 0.44–1.00)
GFR calc non Af Amer: >60 mL/min (ref >60)
Glucose, Bld: 106 mg/dL — ABNORMAL HIGH (ref 65–99)
Potassium: 4.2 mmol/L (ref 3.5–5.1)
SODIUM: 135 mmol/L (ref 135–145)

## 2015-04-14 LAB — CBC
HCT: 25.9 % — ABNORMAL LOW (ref 36.0–46.0)
HEMATOCRIT: 28.7 % — AB (ref 36.0–46.0)
HEMOGLOBIN: 9 g/dL — AB (ref 12.0–15.0)
Hemoglobin: 8.1 g/dL — ABNORMAL LOW (ref 12.0–15.0)
MCH: 29.2 pg (ref 26.0–34.0)
MCH: 29.6 pg (ref 26.0–34.0)
MCHC: 31.3 g/dL (ref 30.0–36.0)
MCHC: 31.4 g/dL (ref 30.0–36.0)
MCV: 93.5 fL (ref 78.0–100.0)
MCV: 94.4 fL (ref 78.0–100.0)
PLATELETS: 148 10*3/uL — AB (ref 150–400)
Platelets: 150 10*3/uL (ref 150–400)
RBC: 2.77 MIL/uL — AB (ref 3.87–5.11)
RBC: 3.04 MIL/uL — AB (ref 3.87–5.11)
RDW: 14.8 % (ref 11.5–15.5)
RDW: 15.1 % (ref 11.5–15.5)
WBC: 10.5 10*3/uL (ref 4.0–10.5)
WBC: 10.8 10*3/uL — AB (ref 4.0–10.5)

## 2015-04-14 MED ORDER — CETYLPYRIDINIUM CHLORIDE 0.05 % MT LIQD
7.0000 mL | Freq: Two times a day (BID) | OROMUCOSAL | Status: DC
  Administered 2015-04-14 – 2015-04-21 (×12): 7 mL via OROMUCOSAL

## 2015-04-14 MED ORDER — LACTULOSE 10 GM/15ML PO SOLN
20.0000 g | Freq: Two times a day (BID) | ORAL | Status: DC
  Administered 2015-04-14 – 2015-04-16 (×4): 20 g via ORAL
  Filled 2015-04-14 (×6): qty 30

## 2015-04-14 NOTE — Consult Note (Signed)
ORTHOPAEDIC CONSULTATION  REQUESTING PHYSICIAN: Geradine Girt, DO  Chief Complaint: L great toe ulcer  HPI: Shannon Rice is a 81 y.o. female who complains of pain in the L great toe due to a necrotic foot ulcer that was being managed by podiatry.  They had been doing dressing changes daily with sylvadine cream.  The patient reports that this has not been helping.  She also reports that she was being seen by wound care this year for a chronic hand ulcer.  She doesn't believe that her foot had been evaluated by the wound center.  Patient is being managed with vancomycin and zosyn at this time. Patient is status post angiogram on 10/4 and was scheduled for discharge, but this was held due to low grade fevers and concerns for osteomyelitis of the L great toe.    Past Medical History  Diagnosis Date  . Hypertension   . GERD (gastroesophageal reflux disease)   . Lupus (HCC)     skin, scleraderma  . Scleroderma ([place])   . Arthritis     scleroderma  . Peripheral vascular disease [facility])    Past Surgical History  Procedure Laterality Date  . Abdominal hysterectomy    . Coronary angioplasty      Dr Tami Ribas  . Sympathectomy Right 01/27/2015    Procedure: RIGHT RADICAL ARTERY SYMPATHECTOMY, RIGHT ULNAR ARTERY SYMPATHECTOMY, DIGITAL SYMPATHECTOMY RIGHT INDEX, MIDDLE, RING SMALL;  Surgeon: Daryll Brod, MD;  Location: [place];  Service: Orthopedics;  Laterality: Right;  . Amputation Right 01/27/2015    Procedure: AMPUTATION DISTAL TIP RIGHT INDEX FINGER;  Surgeon: Daryll Brod, MD;  Location: [place];  Service: Orthopedics;  Laterality: Right;  . Peripheral vascular catheterization N/A 02/15/2015    Procedure: Lower Extremity Angiography;  Surgeon: Adrian Prows, MD;  Location: [place] CV LAB;  Service: Cardiovascular;  Laterality: N/A;  . Peripheral vascular catheterization Left 04/12/2015    Procedure: Lower Extremity Angiography;  Surgeon: Adrian Prows, MD;   Location: [place] CV LAB;  Service: Cardiovascular;  Laterality: Left;  . Peripheral vascular catheterization Left 04/12/2015    Procedure: Peripheral Vascular Atherectomy;  Surgeon: Adrian Prows, MD;  Location: [place] CV LAB;  Service: Cardiovascular;  Laterality: Left;  sfa      Social History   Social History  . Marital Status: Widowed    Spouse Name: N/A  . Number of Children: N/A  . Years of Education: N/A   Social History Main Topics  . Smoking status: Former Smoker -- 0.25 packs/day for 60 years    Types: Cigarettes    Quit date: 12/12/2014  . Smokeless tobacco: Never Used     Comment: Pt refused info  . Alcohol Use: Yes     Comment: glass wine on special occasions  . Drug Use: No  . Sexual Activity: Not Asked   Other Topics Concern  . None   Social History Narrative   Family History  Problem Relation Age of Onset  . Diabetes Sister   . Diabetes Brother    No Known Allergies Prior to Admission medications   Medication Sig Start Date End Date Taking? Authorizing Provider  acetaminophen (TYLENOL) 500 MG tablet Take 1,000 mg by mouth every 6 (six) hours as needed for moderate pain or headache.   Yes Historical Provider, MD  amLODipine-benazepril (LOTREL) 5-20 MG per capsule Take 1 capsule by mouth daily.   Yes Historical Provider, MD  aspirin EC 325 MG tablet  Take 325 mg by mouth daily.   Yes Historical Provider, MD  cilostazol (PLETAL) 50 MG tablet Take 50 mg by mouth daily. 02/25/15  Yes Historical Provider, MD  diphenhydrAMINE-zinc acetate (BENADRYL) cream Apply 1 application topically 3 (three) times daily as needed for itching.   Yes Historical Provider, MD  erythromycin ophthalmic ointment Place 1 application into both eyes at bedtime. 03/31/15  Yes Historical Provider, MD  metoprolol tartrate (LOPRESSOR) 25 MG tablet Take 25 mg by mouth 2 (two) times daily.   Yes Historical Provider, MD  oxyCODONE-acetaminophen (PERCOCET/ROXICET) 5-325 MG tablet Take 1 tablet  by mouth every 4 (four) hours as needed. 04/05/15  Yes Historical Provider, MD  Pitavastatin Calcium (LIVALO) 2 MG TABS Take 2 mg by mouth 2 (two) times daily.   Yes Historical Provider, MD  SANTYL ointment Apply 1 application topically daily at 12 noon. 12/22/14  Yes Historical Provider, MD  valACYclovir (VALTREX) 1000 MG tablet Take 1,000 mg by mouth 3 (three) times daily. 03/31/15  Yes Historical Provider, MD  aspirin EC 81 MG EC tablet Take 1 tablet (81 mg total) by mouth daily. 04/13/15   Neldon Labella, NP  HYDROcodone-acetaminophen (NORCO) 5-325 MG per tablet Take 1 tablet by mouth every 6 (six) hours as needed for moderate pain. Patient not taking: Reported on 04/11/2015 01/27/15   Daryll Brod, MD  ibuprofen (ADVIL,MOTRIN) 200 MG tablet Take 400 mg by mouth every 6 (six) hours as needed for headache or moderate pain.    Historical Provider, MD  ondansetron (ZOFRAN ODT) 4 MG disintegrating tablet Take 1 tablet (4 mg total) by mouth every 8 (eight) hours as needed for nausea or vomiting. 04/13/15   Neldon Labella, NP  Pitavastatin Calcium 4 MG TABS Take 2 mg by mouth 2 (two) times daily.    Historical Provider, MD  ticagrelor (BRILINTA) 90 MG TABS tablet Take 1 tablet (90 mg total) by mouth 2 (two) times daily. 04/13/15   Neldon Labella, NP   Dg Chest 1 View  04/13/2015   CLINICAL DATA:  Hypoxia. History of hypertension and peripheral vascular disease.  EXAM: CHEST 1 VIEW  COMPARISON:  05/13/2014  FINDINGS: There is focal consolidation superimpose over the right hilum with more ill-defined consolidation extending into the central right upper lobe. Linear type opacity is noted in both lung bases.  Cardiac silhouette is mildly enlarged. No mediastinal or left hilar masses or adenopathy.  No pneumothorax.  Probable small pleural effusions.  Skeletal structures or demineralized but grossly intact.  IMPRESSION: 1. Right lung opacity with focal opacity superimposed over the right hilum and less  well-defined airspace opacity extending into the central right upper lobe. Pneumonia suspected. Central neoplastic disease is possible with postobstructive change. 2. There is additional opacity in both lung bases that is most likely atelectasis. Probable small pleural effusions.   Electronically Signed   By: Lajean Manes M.D.   On: 04/13/2015 17:04   Dg Abd 1 View  04/13/2015   CLINICAL DATA:  Periumbilical region pain  EXAM: ABDOMEN - 1 VIEW  COMPARISON:  CT abdomen and pelvis August 24, 2010  FINDINGS: There is diffuse stool in the colon. There is no bowel dilatation or air-fluid level suggesting obstruction. No free air. There is widespread arterial vascular calcification. There is consolidation in the left lung base. There is extensive arthropathy in the hip joints bilaterally.  IMPRESSION: Diffuse stool in colon. Bowel gas pattern unremarkable. Extensive arterial vascular calcification.   Electronically Signed   By: Gwyndolyn Saxon  Jasmine December III M.D.   On: 04/13/2015 17:03   Dg Foot 2 Views Left  04/13/2015   CLINICAL DATA:  Left great toe pain with necrosis. Evaluate for osteomyelitis. Initial encounter.  EXAM: LEFT FOOT - 2 VIEW  COMPARISON:  None.  FINDINGS: The bones are demineralized. There is soft tissue irregularity and swelling in the great toe. There is possible cortical erosion of the distal first phalanx. The proximal phalanx and first metatarsal appear normal. There is no evidence of acute fracture or dislocation. There are mild degenerative changes within the midfoot and hindfoot.  IMPRESSION: Possible osteomyelitis of the distal phalanx of the great toe with associated soft tissue irregularity.   Electronically Signed   By: Richardean Sale M.D.   On: 04/13/2015 17:09    Positive ROS: All other systems have been reviewed and were otherwise negative with the exception of those mentioned in the HPI and as above.  Labs cbc  Recent Labs  04/13/15 2331 04/14/15 0402  WBC 10.5 10.8*  HGB  8.1* 9.0*  HCT 25.9* 28.7*  PLT 148* 150    Labs inflam No results for input(s): CRP in the last 72 hours.  Invalid input(s): ESR  Labs coag  Recent Labs  04/12/15 0945  INR 1.16     Recent Labs  04/13/15 1515 04/14/15 0402  NA 136 135  K 3.3* 4.2  CL 104 101  CO2 28 28  GLUCOSE 124* 106*  BUN 10 12  CREATININE 0.45 0.54  CALCIUM 8.2* 7.9*    Physical Exam: Filed Vitals:   04/14/15 1235  BP: 157/56  Pulse: 83  Temp: 98 F (36.7 C)  Resp: 18   General: Alert, no acute distress Cardiovascular: No pedal edema Respiratory: No cyanosis, no use of accessory musculature GI: No organomegaly, abdomen is soft and non-tender Neurologic: Sensation intact distally Psychiatric: Patient is competent for consent with normal mood and affect Lymphatic: No axillary or cervical lymphadenopathy  MUSCULOSKELETAL:  L great toe has a 2-3 cm in diameter necrotic ulcer that purulent drainage that can be expressed from the tissue.  The patient has considerable pain with palpation of the toe.  Sensation intact with faint pulses felt in the foot.      Other extremities are atraumatic with painless ROM and NVI.  Assessment: L great toe necrotic ulcer  Plan: The patient has not indication for urgent surgical intervention at this time.  Will continue wound care dressing and IV abx.  Will have Dr. Sharol Given see on Monday for further treatment planning.  WBAT in the L foot.    Gae Dry, PA-C Cell [phone]   04/14/2015 6:20 PM

## 2015-04-14 NOTE — Care Management Note (Addendum)
Case Management Note  Patient Details  Name: Shannon Rice MRN: [id number] Date of Birth: 01/25/1931  Subjective/Objective:              Admitted with PAD s/p angiogram 10/4, history of scleroderma, peripheral vascular disease, dyslipidemia. Lives alone, has supportive daughter.   Action/Plan: Return to home when medically stable. CM to f/u with d/c needs.  Expected Discharge Date:                  Expected Discharge Plan:  Home/Self Care  In-House Referral:     Discharge planning Services  CM Consult  Post Acute Care Choice:    Choice offered to:     DME Arranged:    DME Agency:     HH Arranged:    HH Agency:     Status of Service:  In process, will continue to follow  Medicare Important Message Given:    Date Medicare IM Given:    Medicare IM give by:    Date Additional Medicare IM Given:    Additional Medicare Important Message give by:     If discussed at Long Length of Stay Meetings, dates discussed:    Additional Comments: Para March (Daughter)  [phone]  Gae Gallop [place], [place] [phone] 04/14/2015, 2:44 PM

## 2015-04-14 NOTE — Progress Notes (Signed)
Patient transferred by bed and with escort of nurse to 2W12.  Pt in no distress, vital signs stable as charted, all belonging secured with patient.Shannon Rice

## 2015-04-14 NOTE — Progress Notes (Signed)
PROGRESS NOTE  Shannon Rice ZOX:[id number] DOB: 11-24-1930 DOA: 04/12/2015 PCP: Ralene Ok, MD  Assessment/Plan: Postprocedural fever. No specific complaints other than right ankle pain and left great toe pain (necrotic lesion). -x ray shows osteo--- called ortho Lajoyce Corners to see Check blood cultures urinalysis CXR- shows suspected PNA: Right lung opacity with focal opacity superimposed over the right hilum and less well-defined airspace opacity extending into the central right upper lobe. Pneumonia suspected. Central neoplastic disease is possible with postobstructive change.  Hypoxia. Spoke with nursing staff. Patient's 02 sats fell from 100% on 2 Liters to the 80's early this am. Sats have improved to 92%. =pneumonia but also elevated d dimer so may need V/Q scan-- duplex negative for DVT  HTN, stable Continue home anti-hypertensives  Hypokalemia. Will replete, add K to IVF. Check am BMET   Herpes zoster keratoconjunctivitis. On valtrex   Critical lower limb ischemia, patient is s/p revascularization of / thrombectomy of the left peroneal artery yesterday. On Pletal at home, Brilinta just added. .   Weight loss. Nutritionist seeing now. She has lost approx 15 pounds over last several months.   Constipation. Start stool softeners. Will try daily miralax.  PT eval -lived home alone, walked independantly  Code Status: full Family Communication: multiple family members at bedside Disposition Plan:    Consultants:  Cards  ortho  Procedures:    HPI/Subjective: No SOB, no CP  Objective: Filed Vitals:   04/14/15 0809  BP: 126/43  Pulse: 81  Temp: 98 F (36.7 C)  Resp: 18    Intake/Output Summary (Last 24 hours) at 04/14/15 1217 Last data filed at 04/14/15 0700  Gross per 24 hour  Intake    120 ml  Output      0 ml  Net    120 ml   Filed Weights   04/12/15 0932 04/13/15 0333 04/14/15 0337  Weight: 43.092 kg (95 lb) 43 kg (94 lb 12.8 oz) 42.8 kg (94  lb 5.7 oz)    Exam:   General:  Awake, NAD  Cardiovascular: rrr  Respiratory: clear  Abdomen: +BS, soft  Musculoskeletal: no edema, toe wrapped   Data Reviewed: Basic Metabolic Panel:  Recent Labs Lab 04/12/15 0945 04/13/15 1515 04/14/15 0402  NA 138 136 135  K 3.3* 3.3* 4.2  CL 103 104 101  CO2 27 28 28   GLUCOSE 83 124* 106*  BUN 6 10 12   CREATININE 0.48 0.45 0.54  CALCIUM 8.7* 8.2* 7.9*   Liver Function Tests:  Recent Labs Lab 04/13/15 1515  AST 23  ALT 10*  ALKPHOS 52  BILITOT 0.6  PROT 5.6*  ALBUMIN 2.7*    Recent Labs Lab 04/13/15 1515  LIPASE 13*   No results for input(s): AMMONIA in the last 168 hours. CBC:  Recent Labs Lab 04/12/15 0945 04/13/15 1515 04/13/15 2331 04/14/15 0402  WBC 4.5 9.9 10.5 10.8*  HGB 10.3* 8.9* 8.1* 9.0*  HCT 33.2* 28.1* 25.9* 28.7*  MCV 93.8 93.7 93.5 94.4  PLT 172 157 148* 150   Cardiac Enzymes: No results for input(s): CKTOTAL, CKMB, CKMBINDEX, TROPONINI in the last 168 hours. BNP (last 3 results) No results for input(s): BNP in the last 8760 hours.  ProBNP (last 3 results) No results for input(s): PROBNP in the last 8760 hours.  CBG: No results for input(s): GLUCAP in the last 168 hours.  Recent Results (from the past 240 hour(s))  Culture, blood (routine x 2)     Status: None (Preliminary result)  Collection Time: 04/13/15  5:10 PM  Result Value Ref Range Status   Specimen Description BLOOD LEFT ANTECUBITAL  Final   Special Requests BOTTLES DRAWN AEROBIC ONLY 10CC  Final   Culture NO GROWTH < 24 HOURS  Final   Report Status PENDING  Incomplete  Culture, blood (routine x 2)     Status: None (Preliminary result)   Collection Time: 04/13/15  5:20 PM  Result Value Ref Range Status   Specimen Description BLOOD RIGHT HAND  Final   Special Requests BOTTLES DRAWN AEROBIC ONLY 5CC  Final   Culture NO GROWTH < 24 HOURS  Final   Report Status PENDING  Incomplete     Studies: Dg Chest 1  View  04/13/2015   CLINICAL DATA:  Hypoxia. History of hypertension and peripheral vascular disease.  EXAM: CHEST 1 VIEW  COMPARISON:  05/13/2014  FINDINGS: There is focal consolidation superimpose over the right hilum with more ill-defined consolidation extending into the central right upper lobe. Linear type opacity is noted in both lung bases.  Cardiac silhouette is mildly enlarged. No mediastinal or left hilar masses or adenopathy.  No pneumothorax.  Probable small pleural effusions.  Skeletal structures or demineralized but grossly intact.  IMPRESSION: 1. Right lung opacity with focal opacity superimposed over the right hilum and less well-defined airspace opacity extending into the central right upper lobe. Pneumonia suspected. Central neoplastic disease is possible with postobstructive change. 2. There is additional opacity in both lung bases that is most likely atelectasis. Probable small pleural effusions.   Electronically Signed   By: Amie Portland M.D.   On: 04/13/2015 17:04   Dg Abd 1 View  04/13/2015   CLINICAL DATA:  Periumbilical region pain  EXAM: ABDOMEN - 1 VIEW  COMPARISON:  CT abdomen and pelvis August 24, 2010  FINDINGS: There is diffuse stool in the colon. There is no bowel dilatation or air-fluid level suggesting obstruction. No free air. There is widespread arterial vascular calcification. There is consolidation in the left lung base. There is extensive arthropathy in the hip joints bilaterally.  IMPRESSION: Diffuse stool in colon. Bowel gas pattern unremarkable. Extensive arterial vascular calcification.   Electronically Signed   By: Bretta Bang III M.D.   On: 04/13/2015 17:03   Dg Foot 2 Views Left  04/13/2015   CLINICAL DATA:  Left great toe pain with necrosis. Evaluate for osteomyelitis. Initial encounter.  EXAM: LEFT FOOT - 2 VIEW  COMPARISON:  None.  FINDINGS: The bones are demineralized. There is soft tissue irregularity and swelling in the great toe. There is possible  cortical erosion of the distal first phalanx. The proximal phalanx and first metatarsal appear normal. There is no evidence of acute fracture or dislocation. There are mild degenerative changes within the midfoot and hindfoot.  IMPRESSION: Possible osteomyelitis of the distal phalanx of the great toe with associated soft tissue irregularity.   Electronically Signed   By: Carey Bullocks M.D.   On: 04/13/2015 17:09    Scheduled Meds: . amLODipine  5 mg Oral Daily  . antiseptic oral rinse  7 mL Mouth Rinse BID  . aspirin EC  81 mg Oral Daily  . benazepril  10 mg Oral Daily  . cilostazol  50 mg Oral Daily  . collagenase  1 application Topical Q1200  . docusate sodium  100 mg Oral BID  . erythromycin  1 application Both Eyes QHS  . feeding supplement (ENSURE ENLIVE)  237 mL Oral BID BM  . metoprolol  tartrate  25 mg Oral BID  . piperacillin-tazobactam (ZOSYN)  IV  3.375 g Intravenous Q8H  . polyethylene glycol  17 g Oral Daily  . pravastatin  80 mg Oral q1800  . ticagrelor  90 mg Oral BID  . valACYclovir  1,000 mg Oral TID  . vancomycin  750 mg Intravenous Q24H   Continuous Infusions: . sodium chloride 0.9 % 1,000 mL with potassium chloride 20 mEq infusion 100 mL/hr at 04/14/15 0959   Antibiotics Given (last 72 hours)    Date/Time Action Medication Dose Rate   04/12/15 2045 Given   valACYclovir (VALTREX) tablet 1,000 mg 1,000 mg    04/13/15 1611 Given   valACYclovir (VALTREX) tablet 1,000 mg 1,000 mg    04/13/15 1830 Given   vancomycin (VANCOCIN) IVPB 750 mg/150 ml premix 750 mg 150 mL/hr   04/13/15 2000 Given   piperacillin-tazobactam (ZOSYN) IVPB 3.375 g 3.375 g 12.5 mL/hr   04/14/15 0052 Given   piperacillin-tazobactam (ZOSYN) IVPB 3.375 g 3.375 g 12.5 mL/hr   04/14/15 0900 Given   piperacillin-tazobactam (ZOSYN) IVPB 3.375 g 3.375 g 12.5 mL/hr   04/14/15 4098 Given   valACYclovir (VALTREX) tablet 1,000 mg 1,000 mg       Principal Problem:   Critical lower limb  ischemia Active Problems:   Postprocedural fever   Herpes zoster keratoconjunctivitis   HTN (hypertension)   Constipation   Hypokalemia   Abdominal pain   Hypoxia    Time spent: 25 min    Lyniah Fujita  [facility] Pager [phone]. If 7PM-7AM, please contact night-coverage at [email or web address], password [facility] 04/14/2015, 12:17 PM

## 2015-04-15 ENCOUNTER — Inpatient Hospital Stay (HOSPITAL_COMMUNITY): Payer: Medicare Other

## 2015-04-15 DIAGNOSIS — R109 Unspecified abdominal pain: Secondary | ICD-10-CM

## 2015-04-15 LAB — BASIC METABOLIC PANEL
ANION GAP: 7 (ref 5–15)
BUN: 8 mg/dL (ref 6–20)
CO2: 28 mmol/L (ref 22–32)
Calcium: 8.4 mg/dL — ABNORMAL LOW (ref 8.9–10.3)
Chloride: 102 mmol/L (ref 101–111)
Creatinine, Ser: 0.4 mg/dL — ABNORMAL LOW (ref 0.44–1.00)
GLUCOSE: 116 mg/dL — AB (ref 65–99)
POTASSIUM: 3.8 mmol/L (ref 3.5–5.1)
Sodium: 137 mmol/L (ref 135–145)

## 2015-04-15 MED ORDER — FUROSEMIDE 10 MG/ML IJ SOLN
40.0000 mg | Freq: Once | INTRAMUSCULAR | Status: AC
  Administered 2015-04-15: 40 mg via INTRAVENOUS
  Filled 2015-04-15: qty 4

## 2015-04-15 MED ORDER — TECHNETIUM TC 99M DIETHYLENETRIAME-PENTAACETIC ACID: 41.5000 | Freq: Once | INTRAVENOUS | Status: DC | PRN

## 2015-04-15 MED ORDER — TECHNETIUM TO 99M ALBUMIN AGGREGATED
5.8000 | Freq: Once | INTRAVENOUS | Status: AC | PRN
  Administered 2015-04-15: 6 via INTRAVENOUS

## 2015-04-15 MED ORDER — ALBUTEROL SULFATE (2.5 MG/3ML) 0.083% IN NEBU
2.5000 mg | INHALATION_SOLUTION | RESPIRATORY_TRACT | Status: DC | PRN
  Administered 2015-04-16: 2.5 mg via RESPIRATORY_TRACT
  Filled 2015-04-15: qty 3

## 2015-04-15 NOTE — Care Management Note (Signed)
Case Management Note  Patient Details  Name: Shannon Rice MRN: [id number] Date of Birth: 07-17-1930  Subjective/Objective:              Admitted with PAD s/p angiogram 10/4, history of scleroderma, peripheral vascular disease, dyslipidemia. Lives alone, has supportive daughter.   Action/Plan: Return to home when medically stable. CM to f/u with d/c needs.  Expected Discharge Date:                  Expected Discharge Plan:  Home/Self Care  In-House Referral:     Discharge planning Services  CM Consult  Post Acute Care Choice:    Choice offered to:     DME Arranged:    DME Agency:     HH Arranged:    HH Agency:     Status of Service:  In process, will continue to follow  Medicare Important Message Given:  Yes-second notification given Date Medicare IM Given:    Medicare IM give by:    Date Additional Medicare IM Given:    Additional Medicare Important Message give by:     If discussed at Long Length of Stay Meetings, dates discussed:    Additional Comments: 04/15/2015 CM submitted beneifts check.  CM assessed pt, pt is independent without HH or DME  from home per daughter and pt.  Daughter is at bedside. Benefit check yielded: Pt copay will be $24- prior auth is not required. Pt already takes this med- this will be her last purchase from retail before she has to start mail order- her mail order amount will be $20. She is only allowed to purchase this med 2x from retail.  Pt stated her preferred pharmacy was [facility] on [address]  CM contacted pharmacy and was informed that a partial fill could be made immediately and next day delivery would be available for remainder.  CM asked pharmacist if pt has previously filled drug;  per pharmacy [facility] submitted script on 10/5 and is ready for pick up (this is the 1st purchase mentioned above) .  Prior to this pt has never filled drug.  CM explained to pt and daughter that she would have one additional fill available from [facility] after she picks up immediately post discharge, after that they would have to use mail order.  Pts daughter stated she understood.   Shannon Rice (Daughter)  [phone]  Shannon Rice, [place] [phone] 04/15/2015, 12:04 PM

## 2015-04-15 NOTE — Progress Notes (Signed)
PATIENT ARRIVED TO UNIT 2W FROM 6C VIA BED.

## 2015-04-15 NOTE — Clinical Documentation Improvement (Signed)
Hospitalist  Can the diagnosis of Malnutrition be further specified? Please update your documentation within the medical record to reflect your response to this query. Thank you   Document Severity - Severe(third degree), Moderate (second degree), Mild (first degree)  Other condition  Unable to clinically determine  Document any associated diagnoses/conditions  Supporting Information: :  04/13/15 Nutrition assessment with nutrition dx noted.Marland KitchenMarland Kitchen"Severe malnutrition in context of chronic illness... Please note full assessment Please exercise your independent, professional judgment when responding. A specific answer is not anticipated or expected.  Thank You, Toribio Harbour, RN, BSN, CCDS Certified Clinical Documentation Specialist [place]: Health Information Management [phone]

## 2015-04-15 NOTE — Progress Notes (Addendum)
PROGRESS NOTE  AEISHA MINARIK Rice:[id number] DOB: 06-16-1931 DOA: 04/12/2015 PCP: Ralene Ok, MD  81 year old female with a history of scleroderma, peripheral vascular disease, dyslipidemia, status post angiogram on 10/4 by Dr.GANJI, JAY. Anticipated to be discharged from [facility] but discharge canceled because of medical complications. Hospitalist consult requested to take over because of low-grade fever of 100.2, hypoxic respiratory failure with oxygen saturation in the 70s on room air.  Patient has ulcer on left toe that has possible osteo-- seen by ortho and will be seen by Lajoyce Corners on Monday.    Assessment/Plan: Postprocedural fever. No specific complaints other than right ankle pain and left great toe pain (necrotic lesion). -x ray shows possible osteo--- called ortho - Duda to see on Monday- continue abx for now Check blood cultures Urinalysis negative CXR- shows suspected PNA: Right lung opacity with focal opacity superimposed over the right hilum and less well-defined airspace opacity extending into the central right upper lobe. Pneumonia suspected. Central neoplastic disease is possible with postobstructive change-- will need to follow  Hypoxia. Spoke with nursing staff. Patient was requiring 5L but now down to 2L  X ray shows pneumonia but also elevated d dimer  V/Q scan negative-- duplex negative for DVT  HTN, stable Continue home anti-hypertensives  Hypokalemia. repleted   Herpes zoster keratoconjunctivitis. On valtrex   Critical lower limb ischemia, patient is s/p revascularization of / thrombectomy of the left peroneal artery yesterday. On Pletal at home, Brilinta just added. .   Weight loss. Nutritionist seeing now. She has lost approx 15 pounds over last several months- see above for possible post-obstructive changes  Constipation- seen on x ray. Start stool softeners. Will try daily miralax, lactulose added as well -had 2 BMs yesterday  Severe malnutrition in context  of chronic illness   PT eval -lived home alone, walked independantly  Code Status: full Family Communication: multiple family members at bedside 10/6 Disposition Plan:    Consultants:  Cards  ortho  Procedures:    HPI/Subjective: Has some pain in her foot  Objective: Filed Vitals:   04/15/15 0443  BP: 151/48  Pulse: 118  Temp: 98.5 F (36.9 C)  Resp: 18    Intake/Output Summary (Last 24 hours) at 04/15/15 0807 Last data filed at 04/14/15 2244  Gross per 24 hour  Intake   1519 ml  Output    330 ml  Net   1189 ml   Filed Weights   04/12/15 0932 04/13/15 0333 04/14/15 0337  Weight: 43.092 kg (95 lb) 43 kg (94 lb 12.8 oz) 42.8 kg (94 lb 5.7 oz)   tele: sinus 94   Exam:   General:  Awake, more confused this AM-- no family at bedside  Cardiovascular: rrr  Respiratory: expiratory wheezing  Abdomen: +BS, soft  Musculoskeletal: no edema, toe wrapped   Data Reviewed: Basic Metabolic Panel:  Recent Labs Lab 04/12/15 0945 04/13/15 1515 04/14/15 0402  NA 138 136 135  K 3.3* 3.3* 4.2  CL 103 104 101  CO2 GLUCOSE 83 124* 106*  BUN CREATININE 0.48 0.45 0.54  CALCIUM 8.7* 8.2* 7.9*   Liver Function Tests:  Recent Labs Lab 04/13/15 1515  AST 23  ALT 10*  ALKPHOS 52  BILITOT 0.6  PROT 5.6*  ALBUMIN 2.7*    Recent Labs Lab 04/13/15 1515  LIPASE 13*   No results for input(s): AMMONIA in the last 168 hours. CBC:  Recent Labs Lab 04/12/15 0945 04/13/15 1515 04/13/15  2331 04/14/15 0402  WBC 4.5 9.9 10.5 10.8*  HGB 10.3* 8.9* 8.1* 9.0*  HCT 33.2* 28.1* 25.9* 28.7*  MCV 93.8 93.7 93.5 94.4  PLT 172 157 148* 150   Cardiac Enzymes: No results for input(s): CKTOTAL, CKMB, CKMBINDEX, TROPONINI in the last 168 hours. BNP (last 3 results) No results for input(s): BNP in the last 8760 hours.  ProBNP (last 3 results) No results for input(s): PROBNP in the last 8760 hours.  CBG: No results for input(s): GLUCAP in  the last 168 hours.  Recent Results (from the past 240 hour(s))  Culture, blood (routine x 2)     Status: None (Preliminary result)   Collection Time: 04/13/15  5:10 PM  Result Value Ref Range Status   Specimen Description BLOOD LEFT ANTECUBITAL  Final   Special Requests BOTTLES DRAWN AEROBIC ONLY 10CC  Final   Culture NO GROWTH < 24 HOURS  Final   Report Status PENDING  Incomplete  Culture, blood (routine x 2)     Status: None (Preliminary result)   Collection Time: 04/13/15  5:20 PM  Result Value Ref Range Status   Specimen Description BLOOD RIGHT HAND  Final   Special Requests BOTTLES DRAWN AEROBIC ONLY 5CC  Final   Culture NO GROWTH < 24 HOURS  Final   Report Status PENDING  Incomplete     Studies: Dg Chest 1 View  04/13/2015   CLINICAL DATA:  Hypoxia. History of hypertension and peripheral vascular disease.  EXAM: CHEST 1 VIEW  COMPARISON:  05/13/2014  FINDINGS: There is focal consolidation superimpose over the right hilum with more ill-defined consolidation extending into the central right upper lobe. Linear type opacity is noted in both lung bases.  Cardiac silhouette is mildly enlarged. No mediastinal or left hilar masses or adenopathy.  No pneumothorax.  Probable small pleural effusions.  Skeletal structures or demineralized but grossly intact.  IMPRESSION: 1. Right lung opacity with focal opacity superimposed over the right hilum and less well-defined airspace opacity extending into the central right upper lobe. Pneumonia suspected. Central neoplastic disease is possible with postobstructive change. 2. There is additional opacity in both lung bases that is most likely atelectasis. Probable small pleural effusions.   Electronically Signed   By: Amie Portland M.D.   On: 04/13/2015 17:04   Dg Abd 1 View  04/13/2015   CLINICAL DATA:  Periumbilical region pain  EXAM: ABDOMEN - 1 VIEW  COMPARISON:  CT abdomen and pelvis August 24, 2010  FINDINGS: There is diffuse stool in the colon.  There is no bowel dilatation or air-fluid level suggesting obstruction. No free air. There is widespread arterial vascular calcification. There is consolidation in the left lung base. There is extensive arthropathy in the hip joints bilaterally.  IMPRESSION: Diffuse stool in colon. Bowel gas pattern unremarkable. Extensive arterial vascular calcification.   Electronically Signed   By: Bretta Bang III M.D.   On: 04/13/2015 17:03   Dg Foot 2 Views Left  04/13/2015   CLINICAL DATA:  Left great toe pain with necrosis. Evaluate for osteomyelitis. Initial encounter.  EXAM: LEFT FOOT - 2 VIEW  COMPARISON:  None.  FINDINGS: The bones are demineralized. There is soft tissue irregularity and swelling in the great toe. There is possible cortical erosion of the distal first phalanx. The proximal phalanx and first metatarsal appear normal. There is no evidence of acute fracture or dislocation. There are mild degenerative changes within the midfoot and hindfoot.  IMPRESSION: Possible osteomyelitis of the distal  phalanx of the great toe with associated soft tissue irregularity.   Electronically Signed   By: Carey Bullocks M.D.   On: 04/13/2015 17:09    Scheduled Meds: . amLODipine  5 mg Oral Daily  . antiseptic oral rinse  7 mL Mouth Rinse BID  . aspirin EC  81 mg Oral Daily  . benazepril  10 mg Oral Daily  . cilostazol  50 mg Oral Daily  . collagenase  1 application Topical Q1200  . docusate sodium  100 mg Oral BID  . erythromycin  1 application Both Eyes QHS  . feeding supplement (ENSURE ENLIVE)  237 mL Oral BID BM  . lactulose  20 g Oral BID  . metoprolol tartrate  25 mg Oral BID  . piperacillin-tazobactam (ZOSYN)  IV  3.375 g Intravenous Q8H  . polyethylene glycol  17 g Oral Daily  . pravastatin  80 mg Oral q1800  . ticagrelor  90 mg Oral BID  . valACYclovir  1,000 mg Oral TID  . vancomycin  750 mg Intravenous Q24H   Continuous Infusions:   Antibiotics Given (last 72 hours)    Date/Time  Action Medication Dose Rate   04/12/15 2045 Given   valACYclovir (VALTREX) tablet 1,000 mg 1,000 mg    04/13/15 1611 Given   valACYclovir (VALTREX) tablet 1,000 mg 1,000 mg    04/13/15 1830 Given   vancomycin (VANCOCIN) IVPB 750 mg/150 ml premix 750 mg 150 mL/hr   04/13/15 2000 Given   piperacillin-tazobactam (ZOSYN) IVPB 3.375 g 3.375 g 12.5 mL/hr   04/14/15 0052 Given   piperacillin-tazobactam (ZOSYN) IVPB 3.375 g 3.375 g 12.5 mL/hr   04/14/15 0900 Given   piperacillin-tazobactam (ZOSYN) IVPB 3.375 g 3.375 g 12.5 mL/hr   04/14/15 0956 Given   valACYclovir (VALTREX) tablet 1,000 mg 1,000 mg    04/14/15 1630 Given   vancomycin (VANCOCIN) IVPB 750 mg/150 ml premix 750 mg 150 mL/hr   04/14/15 1702 Given   valACYclovir (VALTREX) tablet 1,000 mg 1,000 mg    04/14/15 1742 Given   piperacillin-tazobactam (ZOSYN) IVPB 3.375 g 3.375 g 12.5 mL/hr   04/14/15 2203 Given   valACYclovir (VALTREX) tablet 1,000 mg 1,000 mg    04/15/15 0630 Given   piperacillin-tazobactam (ZOSYN) IVPB 3.375 g 3.375 g 12.5 mL/hr      Principal Problem:   Critical lower limb ischemia Active Problems:   Postprocedural fever   Herpes zoster keratoconjunctivitis   HTN (hypertension)   Constipation   Hypokalemia   Abdominal pain   Hypoxia   Osteomyelitis (HCC)    Time spent: 25 min    Samnang Shugars  [facility] Pager [phone]. If 7PM-7AM, please contact night-coverage at [email or web address], password [facility] 04/15/2015, 8:07 AM  LOS: 1 day

## 2015-04-15 NOTE — Care Management Important Message (Addendum)
Important Message  Patient Details  Name: Shannon Rice MRN: [id number] Date of Birth: 1931/06/12   Medicare Important Message Given:  Yes Second Notification given   Darrold Span, RN 04/15/2015, 10:05 AM

## 2015-04-15 NOTE — Progress Notes (Signed)
PT Cancellation Note  Patient Details Name: Shannon Rice MRN: [id number] DOB: 1931/03/06   Cancelled Treatment:    Reason Eval/Treat Not Completed: Patient not medically ready. Discussed pt case with RN who states that pt is pending a VQ scan. Will hold PT eval at this time and check back for results of scan and medical readiness to participate.    Conni Slipper 04/15/2015, 9:33 AM   Conni Slipper, PT, DPT Acute Rehabilitation Services Pager: [phone]

## 2015-04-16 ENCOUNTER — Inpatient Hospital Stay (HOSPITAL_COMMUNITY): Payer: Medicare Other

## 2015-04-16 DIAGNOSIS — I509 Heart failure, unspecified: Secondary | ICD-10-CM

## 2015-04-16 LAB — BASIC METABOLIC PANEL
ANION GAP: 8 (ref 5–15)
BUN: <5 mg/dL — ABNORMAL LOW (ref 6–20)
CALCIUM: 8.1 mg/dL — AB (ref 8.9–10.3)
CO2: 33 mmol/L — ABNORMAL HIGH (ref 22–32)
Chloride: 94 mmol/L — ABNORMAL LOW (ref 101–111)
Creatinine, Ser: 0.37 mg/dL — ABNORMAL LOW (ref 0.44–1.00)
GLUCOSE: 90 mg/dL (ref 65–99)
Potassium: 3.1 mmol/L — ABNORMAL LOW (ref 3.5–5.1)
SODIUM: 135 mmol/L (ref 135–145)

## 2015-04-16 LAB — CBC
HCT: 26.5 % — ABNORMAL LOW (ref 36.0–46.0)
Hemoglobin: 8.4 g/dL — ABNORMAL LOW (ref 12.0–15.0)
MCH: 29.9 pg (ref 26.0–34.0)
MCHC: 31.7 g/dL (ref 30.0–36.0)
MCV: 94.3 fL (ref 78.0–100.0)
PLATELETS: 180 10*3/uL (ref 150–400)
RBC: 2.81 MIL/uL — ABNORMAL LOW (ref 3.87–5.11)
RDW: 14.8 % (ref 11.5–15.5)
WBC: 9.4 10*3/uL (ref 4.0–10.5)

## 2015-04-16 MED ORDER — POTASSIUM CHLORIDE CRYS ER 20 MEQ PO TBCR
40.0000 meq | EXTENDED_RELEASE_TABLET | Freq: Once | ORAL | Status: AC
  Administered 2015-04-16: 40 meq via ORAL
  Filled 2015-04-16: qty 2

## 2015-04-16 NOTE — Evaluation (Signed)
Physical Therapy Evaluation Patient Details Name: Shannon Rice MRN: [id number] DOB: Dec 18, 1930 Today's Date: 04/16/2015   History of Present Illness  Pt is an 81 y/o female with a PMH that includes HTN, lupus, PVD. Pt initially presented 10/4 for angiogram and was scheduled for d/c. This was held due to low grade fevers and concern for osteomyelitis of the L great toe. Pt to see Dr. Lajoyce Corners on 10/10.  Clinical Impression  Pt admitted with above diagnosis. Pt currently with functional limitations due to the deficits listed below (see PT Problem List). At the time of PT eval mobility was limited due to pain and SOB. Pt on bedpan when PT arrived, and shortly after complained of difficulty breathing. Bedpan was removed and pt was repositioned in bed. On 2L/min supplemental O2 sats were at 94%. Pt declines OOB at this time and complains of increased pain. PT positioned LLE on 2 pillows for pain control. Per daughter, pt had just taken pain meds ~1 hour ago. Pt will benefit from skilled PT to increase their independence and safety with mobility to allow discharge to the venue listed below.       Follow Up Recommendations Home health PT;Supervision for mobility/OOB    Equipment Recommendations  Rolling walker with 5" wheels;3in1 (PT)    Recommendations for Other Services       Precautions / Restrictions Precautions Precautions: Fall Restrictions Weight Bearing Restrictions: Yes LLE Weight Bearing: Weight bearing as tolerated      Mobility  Bed Mobility Overal bed mobility: Needs Assistance Bed Mobility: Rolling Rolling: Min assist         General bed mobility comments: Assist to achieve full roll for bed pan management. NT present to assist with peri-care.   Transfers                    Ambulation/Gait                Stairs            Wheelchair Mobility    Modified Rankin (Stroke Patients Only)       Balance   Sitting-balance support:  Bilateral upper extremity supported Sitting balance-Leahy Scale: Fair Sitting balance - Comments: Pt leaning forward into long sitting while propped up in bed.                                     Pertinent Vitals/Pain Pain Assessment: No/denies pain    Home Living Family/patient expects to be discharged to:: Private residence Living Arrangements: Alone Available Help at Discharge: Family;Available 24 hours/day Type of Home: House Home Access: Stairs to enter   [employer] of Steps: 5 Home Layout: Multi-level (Split level) Home Equipment: None Additional Comments: Pt lives alone but is planning to return to daughter's home at d/c. Information above is for daughter's house.    Prior Function Level of Independence: Independent               Hand Dominance   Dominant Hand: Right    Extremity/Trunk Assessment   Upper Extremity Assessment: Defer to OT evaluation           Lower Extremity Assessment: Generalized weakness;LLE deficits/detail   LLE Deficits / Details: Decreased AROM and acute pain consistent with possible osteomyelitis in big toe     Communication   Communication: No difficulties  Cognition Arousal/Alertness: Awake/alert Behavior During Therapy: WFL for tasks  assessed/performed Overall Cognitive Status: Within Functional Limits for tasks assessed                      General Comments      Exercises        Assessment/Plan    PT Assessment Patient needs continued PT services  PT Diagnosis Difficulty walking;Acute pain   PT Problem List Decreased strength;Decreased range of motion;Decreased activity tolerance;Decreased balance;Decreased mobility;Decreased knowledge of use of DME;Decreased safety awareness;Decreased knowledge of precautions;Pain;Cardiopulmonary status limiting activity  PT Treatment Interventions DME instruction;Gait training;Stair training;Functional mobility training;Therapeutic  activities;Therapeutic exercise;Neuromuscular re-education;Patient/family education   PT Goals (Current goals can be found in the Care Plan section) Acute Rehab PT Goals Patient Stated Goal: Decrease pain PT Goal Formulation: With patient/family Time For Goal Achievement: 04/30/15 Potential to Achieve Goals: Good    Frequency Min 3X/week   Barriers to discharge        Co-evaluation               End of Session   Activity Tolerance: Patient limited by pain Patient left: in bed;with call bell/phone within reach;with bed alarm set;with family/visitor present Nurse Communication: Mobility status;Patient requests pain meds         Time: 1100-1118 PT Time Calculation (min) (ACUTE ONLY): 18 min   Charges:   PT Evaluation $Initial PT Evaluation Tier I: 1 Procedure     PT G Codes:        Conni Slipper 04-30-2015, 11:32 AM   Conni Slipper, PT, DPT Acute Rehabilitation Services Pager: [phone]

## 2015-04-16 NOTE — Progress Notes (Signed)
PROGRESS NOTE  Shannon Rice VWU:[id number] DOB: 1931-04-09 DOA: 04/12/2015 PCP: Ralene Ok, MD  81 year old female with a history of scleroderma, peripheral vascular disease, dyslipidemia, status post angiogram on 10/4 by Dr.GANJI, JAY. Anticipated to be discharged from [facility] but discharge canceled because of medical complications. Hospitalist consult requested to take over because of low-grade fever of 100.2, hypoxic respiratory failure with oxygen saturation in the 70s on room air.  Patient has ulcer on left toe that has possible osteo-- seen by ortho and will be seen by Lajoyce Corners on Monday.    Assessment/Plan: Postprocedural fever. No specific complaints other than right ankle pain and left great toe pain (necrotic lesion). -x ray shows possible osteo--- called ortho  - Duda to see on Monday- continue abx for now  - Blood cultures obtained on 04/13/2015 showed no growth to date - Urinalysis negative - CXR performed on 04/15/2015 showing multifocal airspace opacities in the right lung which may represent areas of alveolar edema versus infection. - Continue vancomycin and Zosyn and will repeat chest x-ray in a.m.  Hypoxia.  -Improved patient titrated off supplemental oxygen as her V/Q scan negative-- duplex negative for DVT  HTN, stable Continue home anti-hypertensives  Hypokalemia.  -Will give 40 mEq of Kdur -Follow-up on a.m. lab work   Herpes zoster keratoconjunctivitis.  -On valtrex   Critical lower limb ischemia, patient is s/p revascularization of / thrombectomy of the left peroneal artery. On Pletal at home, Brilinta just added. .   Weight loss. Nutritionist seeing now. She has lost approx 15 pounds over last several months- see above for possible post-obstructive changes  Constipation- seen on x ray. Start stool softeners. Will try daily miralax, lactulose added as well -had 2 BMs yesterday  Severe malnutrition in context of chronic illness   PT eval -lived home  alone, walked independantly  Code Status: full Family Communication: multiple family members at bedside 10/6 Disposition Plan:    Consultants:  Cards  ortho  Procedures:    HPI/Subjective: She complains of ongoing pain to her left foot, otherwise she is awake and alert, having perhaps mild confusion  Objective: Filed Vitals:   04/16/15 0419  BP: 181/75  Pulse: 72  Temp: 98.6 F (37 C)  Resp: 21    Intake/Output Summary (Last 24 hours) at 04/16/15 0928 Last data filed at 04/16/15 0838  Gross per 24 hour  Intake    596 ml  Output   4350 ml  Net  -3754 ml   Filed Weights   04/12/15 0932 04/13/15 0333 04/14/15 0337  Weight: 43.092 kg (95 lb) 43 kg (94 lb 12.8 oz) 42.8 kg (94 lb 5.7 oz)   tele: sinus 94   Exam:   General:  Awake and alert, seems mildly confused  Cardiovascular: rrr  Respiratory: expiratory wheezing  Abdomen: +BS, soft  Musculoskeletal: no edema, there is ulceration at tip of big toe of left foot, appears necrotic, no evidence of purulence, erythema or active infection  Data Reviewed: Basic Metabolic Panel:  Recent Labs Lab 04/12/15 0945 04/13/15 1515 04/14/15 0402 04/15/15 1220 04/16/15 0341  NA 138 136 135 137 135  K 3.3* 3.3* 4.2 3.8 3.1*  CL 103 104 101 102 94*  CO2 33*  GLUCOSE 83 124* 106* 116* 90  BUN <5*  CREATININE 0.48 0.45 0.54 0.40* 0.37*  CALCIUM 8.7* 8.2* 7.9* 8.4* 8.1*   Liver Function Tests:  Recent Labs Lab 04/13/15 1515  AST 23  ALT 10*  ALKPHOS 52  BILITOT 0.6  PROT 5.6*  ALBUMIN 2.7*    Recent Labs Lab 04/13/15 1515  LIPASE 13*   No results for input(s): AMMONIA in the last 168 hours. CBC:  Recent Labs Lab 04/12/15 0945 04/13/15 1515 04/13/15 2331 04/14/15 0402 04/16/15 0341  WBC 4.5 9.9 10.5 10.8* 9.4  HGB 10.3* 8.9* 8.1* 9.0* 8.4*  HCT 33.2* 28.1* 25.9* 28.7* 26.5*  MCV 93.8 93.7 93.5 94.4 94.3  PLT 172 157 148* 150 180   Cardiac Enzymes: No results for  input(s): CKTOTAL, CKMB, CKMBINDEX, TROPONINI in the last 168 hours. BNP (last 3 results) No results for input(s): BNP in the last 8760 hours.  ProBNP (last 3 results) No results for input(s): PROBNP in the last 8760 hours.  CBG: No results for input(s): GLUCAP in the last 168 hours.  Recent Results (from the past 240 hour(s))  Culture, blood (routine x 2)     Status: None (Preliminary result)   Collection Time: 04/13/15  5:10 PM  Result Value Ref Range Status   Specimen Description BLOOD LEFT ANTECUBITAL  Final   Special Requests BOTTLES DRAWN AEROBIC ONLY 10CC  Final   Culture NO GROWTH 3 DAYS  Final   Report Status PENDING  Incomplete  Culture, blood (routine x 2)     Status: None (Preliminary result)   Collection Time: 04/13/15  5:20 PM  Result Value Ref Range Status   Specimen Description BLOOD RIGHT HAND  Final   Special Requests BOTTLES DRAWN AEROBIC ONLY 5CC  Final   Culture NO GROWTH 3 DAYS  Final   Report Status PENDING  Incomplete     Studies: Dg Chest 2 View  04/15/2015   CLINICAL DATA:  Shortness of breath  EXAM: CHEST  2 VIEW  COMPARISON:  04/13/2015  FINDINGS: Moderate cardiac enlargement. There is aortic atherosclerosis noted. Bilateral pleural effusions are identified and appear increased from previous exam. Mild diffuse interstitial edema. Multifocal airspace opacities are noted in the right upper lobe, right midlung and right base.  IMPRESSION: 1. CHF. 2. Multifocal airspace opacities in the right lung which may represent areas of alveolar edema or infection.   Electronically Signed   By: Signa Kell M.D.   On: 04/15/2015 14:17   Nm Pulmonary Perf And Vent  04/15/2015   CLINICAL DATA:  Elevated D-dimer, shortness of Breath  EXAM: NUCLEAR MEDICINE VENTILATION - PERFUSION LUNG SCAN  TECHNIQUE: Ventilation images were obtained in multiple projections using inhaled aerosol Tc-75m DTPA. Perfusion images were obtained in multiple projections after intravenous injection  of Tc-63m MAA.  RADIOPHARMACEUTICALS:  41.5 mCi of Technetium-37m DTPA aerosol inhalation and 5.8 mCi of Technetium-23m MAA IV  COMPARISON:  Chest x-ray performed today.  FINDINGS: Ventilation: Patchy moderate bilateral ventilation defects, much of which corresponds to airspace disease seen on chest x-ray  Perfusion: No wedge shaped peripheral perfusion defects to suggest acute pulmonary embolism. Patchy nonsegmental perfusion defects bilaterally.  IMPRESSION: Patchy ventilation and perfusion defects, much worse on the ventilation portion of the study. Findings low probability for pulmonary embolus.   Electronically Signed   By: Charlett Nose M.D.   On: 04/15/2015 14:15    Scheduled Meds: . amLODipine  5 mg Oral Daily  . antiseptic oral rinse  7 mL Mouth Rinse BID  . aspirin EC  81 mg Oral Daily  . benazepril  10 mg Oral Daily  . cilostazol  50 mg Oral Daily  . collagenase  1 application Topical Q1200  .  docusate sodium  100 mg Oral BID  . erythromycin  1 application Both Eyes QHS  . feeding supplement (ENSURE ENLIVE)  237 mL Oral BID BM  . lactulose  20 g Oral BID  . metoprolol tartrate  25 mg Oral BID  . piperacillin-tazobactam (ZOSYN)  IV  3.375 g Intravenous Q8H  . polyethylene glycol  17 g Oral Daily  . pravastatin  80 mg Oral q1800  . ticagrelor  90 mg Oral BID  . valACYclovir  1,000 mg Oral TID  . vancomycin  750 mg Intravenous Q24H   Continuous Infusions:   Antibiotics Given (last 72 hours)    Date/Time Action Medication Dose Rate   04/13/15 1611 Given   valACYclovir (VALTREX) tablet 1,000 mg 1,000 mg    04/13/15 1830 Given   vancomycin (VANCOCIN) IVPB 750 mg/150 ml premix 750 mg 150 mL/hr   04/13/15 2000 Given   piperacillin-tazobactam (ZOSYN) IVPB 3.375 g 3.375 g 12.5 mL/hr   04/14/15 0052 Given   piperacillin-tazobactam (ZOSYN) IVPB 3.375 g 3.375 g 12.5 mL/hr   04/14/15 0900 Given   piperacillin-tazobactam (ZOSYN) IVPB 3.375 g 3.375 g 12.5 mL/hr   04/14/15 0956 Given    valACYclovir (VALTREX) tablet 1,000 mg 1,000 mg    04/14/15 1630 Given   vancomycin (VANCOCIN) IVPB 750 mg/150 ml premix 750 mg 150 mL/hr   04/14/15 1702 Given   valACYclovir (VALTREX) tablet 1,000 mg 1,000 mg    04/14/15 1742 Given   piperacillin-tazobactam (ZOSYN) IVPB 3.375 g 3.375 g 12.5 mL/hr   04/14/15 2203 Given   valACYclovir (VALTREX) tablet 1,000 mg 1,000 mg    04/15/15 0630 Given   piperacillin-tazobactam (ZOSYN) IVPB 3.375 g 3.375 g 12.5 mL/hr   04/15/15 0829 Given   valACYclovir (VALTREX) tablet 1,000 mg 1,000 mg    04/15/15 1620 Given   valACYclovir (VALTREX) tablet 1,000 mg 1,000 mg    04/15/15 1626 Given   vancomycin (VANCOCIN) IVPB 750 mg/150 ml premix 750 mg 150 mL/hr   04/15/15 1753 Given   piperacillin-tazobactam (ZOSYN) IVPB 3.375 g 3.375 g 12.5 mL/hr   04/15/15 2114 Given   valACYclovir (VALTREX) tablet 1,000 mg 1,000 mg    04/16/15 0030 Given   piperacillin-tazobactam (ZOSYN) IVPB 3.375 g 3.375 g 12.5 mL/hr   04/16/15 0809 Given   piperacillin-tazobactam (ZOSYN) IVPB 3.375 g 3.375 g 12.5 mL/hr      Principal Problem:   Critical lower limb ischemia Active Problems:   Postprocedural fever   Herpes zoster keratoconjunctivitis   HTN (hypertension)   Constipation   Hypokalemia   Abdominal pain   Hypoxia   Osteomyelitis (HCC)    Time spent: 25 min    Jeralyn Bennett  [facility] Pager [phone]. If 7PM-7AM, please contact night-coverage at [email or web address], password [facility] 04/16/2015, 9:28 AM  LOS: 2 days

## 2015-04-16 NOTE — Progress Notes (Signed)
ANTIBIOTIC CONSULT NOTE - FOLLOW UP  Pharmacy Consult for Vancomycin and Zosyn Indication: rule out sepsis and osteomyelitis  No Known Allergies  Patient Measurements: Height:  (157.5 cm) Weight: 94 lb 5.7 oz (42.8 kg) IBW/kg (Calculated) : 50.1 Adjusted Body Weight:   Vital Signs: Temp: 98.6 F (37 C) (10/08 0419) Temp Source: Oral (10/08 0419) BP: 169/59 mmHg (10/08 1024) Pulse Rate: 74 (10/08 1024) Intake/Output from previous day: 10/07 0701 - 10/08 0700 In: 240 [P.O.:240] Out: 4150 [Urine:4150] Intake/Output from this shift: Total I/O In: 356 [P.O.:356] Out: 900 [Urine:900]  Labs:  Recent Labs  04/13/15 2331 04/14/15 0402 04/15/15 1220 04/16/15 0341  WBC 10.5 10.8*  --  9.4  HGB 8.1* 9.0*  --  8.4*  PLT 148* 150  --  180  CREATININE  --  0.54 0.40* 0.37*   Estimated Creatinine Clearance: 36.6 mL/min (by C-G formula based on Cr of 0.37). No results for input(s): VANCOTROUGH, VANCOPEAK, VANCORANDOM, GENTTROUGH, GENTPEAK, GENTRANDOM, TOBRATROUGH, TOBRAPEAK, TOBRARND, AMIKACINPEAK, AMIKACINTROU, AMIKACIN in the last 72 hours.   Microbiology: Recent Results (from the past 720 hour(s))  Culture, blood (routine x 2)     Status: None (Preliminary result)   Collection Time: 04/13/15  5:10 PM  Result Value Ref Range Status   Specimen Description BLOOD LEFT ANTECUBITAL  Final   Special Requests BOTTLES DRAWN AEROBIC ONLY 10CC  Final   Culture NO GROWTH 3 DAYS  Final   Report Status PENDING  Incomplete  Culture, blood (routine x 2)     Status: None (Preliminary result)   Collection Time: 04/13/15  5:20 PM  Result Value Ref Range Status   Specimen Description BLOOD RIGHT HAND  Final   Special Requests BOTTLES DRAWN AEROBIC ONLY 5CC  Final   Culture NO GROWTH 3 DAYS  Final   Report Status PENDING  Incomplete    Medical History: Past Medical History  Diagnosis Date  . Hypertension   . GERD (gastroesophageal reflux disease)   . Lupus (HCC)     skin,  scleraderma  . Scleroderma (HCC)   . Arthritis     scleroderma  . Peripheral vascular disease (HCC)     Medications:  Prescriptions prior to admission  Medication Sig Dispense Refill Last Dose  . acetaminophen (TYLENOL) 500 MG tablet Take 1,000 mg by mouth every 6 (six) hours as needed for moderate pain or headache.   Past Week at Unknown time  . amLODipine-benazepril (LOTREL) 5-20 MG per capsule Take 1 capsule by mouth daily.   04/12/2015 at Unknown time  . aspirin EC 325 MG tablet Take 325 mg by mouth daily.   04/12/2015 at Unknown time  . cilostazol (PLETAL) 50 MG tablet Take 50 mg by mouth daily.  0 04/11/2015 at Unknown time  . diphenhydrAMINE-zinc acetate (BENADRYL) cream Apply 1 application topically 3 (three) times daily as needed for itching.   04/11/2015 at Unknown time  . erythromycin ophthalmic ointment Place 1 application into both eyes at bedtime.  0 Past Week at Unknown time  . metoprolol tartrate (LOPRESSOR) 25 MG tablet Take 25 mg by mouth 2 (two) times daily.   04/12/2015 at 0700  . oxyCODONE-acetaminophen (PERCOCET/ROXICET) 5-325 MG tablet Take 1 tablet by mouth every 4 (four) hours as needed.  0 04/12/2015 at 0200  . Pitavastatin Calcium (LIVALO) 2 MG TABS Take 2 mg by mouth 2 (two) times daily.   Past Week at Unknown time  . SANTYL ointment Apply 1 application topically daily at 12 noon.  0 Past Week at Unknown time  . valACYclovir (VALTREX) 1000 MG tablet Take 1,000 mg by mouth 3 (three) times daily.  0 04/11/2015 at Unknown time  . HYDROcodone-acetaminophen (NORCO) 5-325 MG per tablet Take 1 tablet by mouth every 6 (six) hours as needed for moderate pain. (Patient not taking: Reported on 04/11/2015) 30 tablet 0 02/15/2015 at 0330  . ibuprofen (ADVIL,MOTRIN) 200 MG tablet Take 400 mg by mouth every 6 (six) hours as needed for headache or moderate pain.   02/12/2015  . Pitavastatin Calcium 4 MG TABS Take 2 mg by mouth 2 (two) times daily.   02/12/2015   Scheduled:  . amLODipine  5 mg  Oral Daily  . antiseptic oral rinse  7 mL Mouth Rinse BID  . aspirin EC  81 mg Oral Daily  . benazepril  10 mg Oral Daily  . cilostazol  50 mg Oral Daily  . collagenase  1 application Topical Q1200  . docusate sodium  100 mg Oral BID  . erythromycin  1 application Both Eyes QHS  . feeding supplement (ENSURE ENLIVE)  237 mL Oral BID BM  . lactulose  20 g Oral BID  . metoprolol tartrate  25 mg Oral BID  . piperacillin-tazobactam (ZOSYN)  IV  3.375 g Intravenous Q8H  . polyethylene glycol  17 g Oral Daily  . pravastatin  80 mg Oral q1800  . ticagrelor  90 mg Oral BID  . valACYclovir  1,000 mg Oral TID  . vancomycin  750 mg Intravenous Q24H   Infusions:    Assessment: 81yo female with history of scleroderma, PVD and HLD was here for peripheral arteriogram. Currently on D#4 of vancomycin and zosyn for suspected sepsis and osteomyelitis. BCx remain negative to date. Pt is afebrile, WBC wnl, sCr stable.   Goal of Therapy:  Vancomycin trough level 15-20 mcg/ml  Plan:  Continue Vancomycin  IV q24h Zosyn 3.375g IV q8h Monitor CBC, renal fx, cultures and clinical progress VT tomorrow   Vinnie Level, PharmD., BCPS Clinical Pharmacist Pager [phone]

## 2015-04-16 NOTE — Progress Notes (Signed)
  Echocardiogram 2D Echocardiogram has been performed.  Shannon Rice 04/16/2015, 1:59 PM

## 2015-04-17 ENCOUNTER — Inpatient Hospital Stay (HOSPITAL_COMMUNITY): Payer: Medicare Other

## 2015-04-17 DIAGNOSIS — M8618 Other acute osteomyelitis, other site: Secondary | ICD-10-CM

## 2015-04-17 LAB — BASIC METABOLIC PANEL
Anion gap: 13 (ref 5–15)
BUN: <5 mg/dL — ABNORMAL LOW (ref 6–20)
CALCIUM: 8.8 mg/dL — AB (ref 8.9–10.3)
CO2: 29 mmol/L (ref 22–32)
CREATININE: 0.33 mg/dL — AB (ref 0.44–1.00)
Chloride: 92 mmol/L — ABNORMAL LOW (ref 101–111)
GFR calc Af Amer: >60 mL/min (ref >60)
GFR calc non Af Amer: >60 mL/min (ref >60)
GLUCOSE: 131 mg/dL — AB (ref 65–99)
Potassium: 3.3 mmol/L — ABNORMAL LOW (ref 3.5–5.1)
Sodium: 134 mmol/L — ABNORMAL LOW (ref 135–145)

## 2015-04-17 LAB — CBC
HCT: 32.1 % — ABNORMAL LOW (ref 36.0–46.0)
Hemoglobin: 10.2 g/dL — ABNORMAL LOW (ref 12.0–15.0)
MCH: 30 pg (ref 26.0–34.0)
MCHC: 31.8 g/dL (ref 30.0–36.0)
MCV: 94.4 fL (ref 78.0–100.0)
PLATELETS: 213 10*3/uL (ref 150–400)
RBC: 3.4 MIL/uL — ABNORMAL LOW (ref 3.87–5.11)
RDW: 14.7 % (ref 11.5–15.5)
WBC: 9.4 10*3/uL (ref 4.0–10.5)

## 2015-04-17 MED ORDER — POTASSIUM CHLORIDE CRYS ER 20 MEQ PO TBCR
40.0000 meq | EXTENDED_RELEASE_TABLET | Freq: Once | ORAL | Status: AC
  Administered 2015-04-17: 40 meq via ORAL
  Filled 2015-04-17: qty 2

## 2015-04-17 MED ORDER — MORPHINE SULFATE (PF) 2 MG/ML IV SOLN
1.0000 mg | INTRAVENOUS | Status: DC | PRN
  Administered 2015-04-17 – 2015-04-20 (×10): 1 mg via INTRAVENOUS
  Filled 2015-04-17 (×11): qty 1

## 2015-04-17 MED ORDER — POTASSIUM CHLORIDE CRYS ER 20 MEQ PO TBCR
40.0000 meq | EXTENDED_RELEASE_TABLET | Freq: Once | ORAL | Status: DC
Start: 2015-04-17 — End: 2015-04-17

## 2015-04-17 NOTE — Progress Notes (Signed)
PROGRESS NOTE  Shannon Rice:[id number] DOB: 1931/04/16 DOA: 04/12/2015 PCP: Ralene Ok, MD  81 year old female with a history of scleroderma, peripheral vascular disease, dyslipidemia, status post angiogram on 10/4 by Dr.GANJI, JAY. Anticipated to be discharged from [facility] but discharge canceled because of medical complications. Hospitalist consult requested to take over because of low-grade fever of 100.2, hypoxic respiratory failure with oxygen saturation in the 70s on room air.  Patient has ulcer on left toe that has possible osteo-- seen by ortho and will be seen by Lajoyce Corners on Monday.    Assessment/Plan: Postprocedural fever. No specific complaints other than right ankle pain and left great toe pain (necrotic lesion). -x ray shows possible osteo--- called ortho  - Duda to see on Monday- continue abx for now  - Blood cultures obtained on 04/13/2015 showed no growth to date - Urinalysis negative - CXR performed on 04/15/2015 showing multifocal airspace opacities in the right lung which may represent areas of alveolar edema versus infection. -diuresed about 6.5L - Continue vancomycin and Zosyn  Hypoxia.  -Improved patient titrated off supplemental oxygen as her V/Q scan negative-- duplex negative for DVT  HTN, stable Continue home anti-hypertensives  Hypokalemia.  -replete   Herpes zoster keratoconjunctivitis.  -On valtrex   Critical lower limb ischemia, patient is s/p revascularization of / thrombectomy of the left peroneal artery. On Pletal at home, Brilinta just added. .   Weight loss. Nutritionist seeing now. She has lost approx 15 pounds over last several months- see above for possible post-obstructive changes  Constipation- seen on x ray. D/c laculose as now having diarrhea  Severe malnutrition in context of chronic illness  PT eval -lived home alone, walked independantly  Code Status: full Family Communication: called daughter- Para March-- no  answer Disposition Plan:    Consultants:  Cards  ortho  Procedures:    HPI/Subjective: She complains of ongoing pain to her left toe  Objective: Filed Vitals:   04/17/15 0310  BP: 155/45  Pulse: 69  Temp: 97.6 F (36.4 C)  Resp: 18    Intake/Output Summary (Last 24 hours) at 04/17/15 1309 Last data filed at 04/17/15 0840  Gross per 24 hour  Intake    590 ml  Output   1693 ml  Net  -1103 ml   Filed Weights   04/12/15 0932 04/13/15 0333 04/14/15 0337  Weight: 43.092 kg (95 lb) 43 kg (94 lb 12.8 oz) 42.8 kg (94 lb 5.7 oz)   tele: sinus 94   Exam:   General:  Awake and alert, seems mildly confused  Cardiovascular: rrr  Respiratory: expiratory wheezing  Abdomen: +BS, soft  Musculoskeletal: no edema, there is ulceration at tip of big toe of left foot, appears necrotic, no evidence of purulence, erythema or active infection  Data Reviewed: Basic Metabolic Panel:  Recent Labs Lab 04/13/15 1515 04/14/15 0402 04/15/15 1220 04/16/15 0341 04/17/15 0327  NA 136 135 137 135 134*  K 3.3* 4.2 3.8 3.1* 3.3*  CL 104 101 102 94* 92*  CO2 28 28 28  33* 29  GLUCOSE 124* 106* 116* 90 131*  BUN 10 12 8  <5* <5*  CREATININE 0.45 0.54 0.40* 0.37* 0.33*  CALCIUM 8.2* 7.9* 8.4* 8.1* 8.8*   Liver Function Tests:  Recent Labs Lab 04/13/15 1515  AST 23  ALT 10*  ALKPHOS 52  BILITOT 0.6  PROT 5.6*  ALBUMIN 2.7*    Recent Labs Lab 04/13/15 1515  LIPASE 13*   No results for input(s): AMMONIA in the  last 168 hours. CBC:  Recent Labs Lab 04/13/15 1515 04/13/15 2331 04/14/15 0402 04/16/15 0341 04/17/15 0327  WBC 9.9 10.5 10.8* 9.4 9.4  HGB 8.9* 8.1* 9.0* 8.4* 10.2*  HCT 28.1* 25.9* 28.7* 26.5* 32.1*  MCV 93.7 93.5 94.4 94.3 94.4  PLT 157 148* 150 180 213   Cardiac Enzymes: No results for input(s): CKTOTAL, CKMB, CKMBINDEX, TROPONINI in the last 168 hours. BNP (last 3 results) No results for input(s): BNP in the last 8760 hours.  ProBNP (last 3  results) No results for input(s): PROBNP in the last 8760 hours.  CBG: No results for input(s): GLUCAP in the last 168 hours.  Recent Results (from the past 240 hour(s))  Culture, blood (routine x 2)     Status: None (Preliminary result)   Collection Time: 04/13/15  5:10 PM  Result Value Ref Range Status   Specimen Description BLOOD LEFT ANTECUBITAL  Final   Special Requests BOTTLES DRAWN AEROBIC ONLY 10CC  Final   Culture NO GROWTH 4 DAYS  Final   Report Status PENDING  Incomplete  Culture, blood (routine x 2)     Status: None (Preliminary result)   Collection Time: 04/13/15  5:20 PM  Result Value Ref Range Status   Specimen Description BLOOD RIGHT HAND  Final   Special Requests BOTTLES DRAWN AEROBIC ONLY 5CC  Final   Culture NO GROWTH 4 DAYS  Final   Report Status PENDING  Incomplete     Studies: Dg Chest 2 View  04/17/2015   CLINICAL DATA:  Hospital acquired pneumonia.  Follow-up.  EXAM: CHEST  2 VIEW  COMPARISON:  04/15/2015.  04/13/2015.  05/13/2014.  FINDINGS: Chronic cardiomegaly and aortic atherosclerosis. Chronic pulmonary hyperinflation. Patchy infiltrates in the perihilar regions right more than left are less dense. Effusions and basilar atelectasis are improved. No worsening or new finding.  IMPRESSION: Radiographic improvement with decreased density of perihilar infiltrates right more than left and reduction in size of effusions with basilar atelectasis.   Electronically Signed   By: Paulina Fusi M.D.   On: 04/17/2015 08:52   Dg Chest 2 View  04/15/2015   CLINICAL DATA:  Shortness of breath  EXAM: CHEST  2 VIEW  COMPARISON:  04/13/2015  FINDINGS: Moderate cardiac enlargement. There is aortic atherosclerosis noted. Bilateral pleural effusions are identified and appear increased from previous exam. Mild diffuse interstitial edema. Multifocal airspace opacities are noted in the right upper lobe, right midlung and right base.  IMPRESSION: 1. CHF. 2. Multifocal airspace opacities  in the right lung which may represent areas of alveolar edema or infection.   Electronically Signed   By: Signa Kell M.D.   On: 04/15/2015 14:17   Nm Pulmonary Perf And Vent  04/15/2015   CLINICAL DATA:  Elevated D-dimer, shortness of Breath  EXAM: NUCLEAR MEDICINE VENTILATION - PERFUSION LUNG SCAN  TECHNIQUE: Ventilation images were obtained in multiple projections using inhaled aerosol Tc-38m DTPA. Perfusion images were obtained in multiple projections after intravenous injection of Tc-65m MAA.  RADIOPHARMACEUTICALS:  41.5 mCi of Technetium-17m DTPA aerosol inhalation and 5.8 mCi of Technetium-24m MAA IV  COMPARISON:  Chest x-ray performed today.  FINDINGS: Ventilation: Patchy moderate bilateral ventilation defects, much of which corresponds to airspace disease seen on chest x-ray  Perfusion: No wedge shaped peripheral perfusion defects to suggest acute pulmonary embolism. Patchy nonsegmental perfusion defects bilaterally.  IMPRESSION: Patchy ventilation and perfusion defects, much worse on the ventilation portion of the study. Findings low probability for pulmonary embolus.  Electronically Signed   By: Charlett Nose M.D.   On: 04/15/2015 14:15    Scheduled Meds: . amLODipine  5 mg Oral Daily  . antiseptic oral rinse  7 mL Mouth Rinse BID  . aspirin EC  81 mg Oral Daily  . benazepril  10 mg Oral Daily  . cilostazol  50 mg Oral Daily  . collagenase  1 application Topical Q1200  . docusate sodium  100 mg Oral BID  . erythromycin  1 application Both Eyes QHS  . feeding supplement (ENSURE ENLIVE)  237 mL Oral BID BM  . metoprolol tartrate  25 mg Oral BID  . piperacillin-tazobactam (ZOSYN)  IV  3.375 g Intravenous Q8H  . polyethylene glycol  17 g Oral Daily  . pravastatin  80 mg Oral q1800  . ticagrelor  90 mg Oral BID  . valACYclovir  1,000 mg Oral TID  . vancomycin  750 mg Intravenous Q24H   Continuous Infusions:   Antibiotics Given (last 72 hours)    Date/Time Action Medication Dose  Rate   04/14/15 1630 Given   vancomycin (VANCOCIN) IVPB 750 mg/150 ml premix 750 mg 150 mL/hr   04/14/15 1702 Given   valACYclovir (VALTREX) tablet 1,000 mg 1,000 mg    04/14/15 1742 Given   piperacillin-tazobactam (ZOSYN) IVPB 3.375 g 3.375 g 12.5 mL/hr   04/14/15 2203 Given   valACYclovir (VALTREX) tablet 1,000 mg 1,000 mg    04/15/15 0630 Given   piperacillin-tazobactam (ZOSYN) IVPB 3.375 g 3.375 g 12.5 mL/hr   04/15/15 0829 Given   valACYclovir (VALTREX) tablet 1,000 mg 1,000 mg    04/15/15 1620 Given   valACYclovir (VALTREX) tablet 1,000 mg 1,000 mg    04/15/15 1626 Given   vancomycin (VANCOCIN) IVPB 750 mg/150 ml premix 750 mg 150 mL/hr   04/15/15 1753 Given   piperacillin-tazobactam (ZOSYN) IVPB 3.375 g 3.375 g 12.5 mL/hr   04/15/15 2114 Given   valACYclovir (VALTREX) tablet 1,000 mg 1,000 mg    04/16/15 0030 Given   piperacillin-tazobactam (ZOSYN) IVPB 3.375 g 3.375 g 12.5 mL/hr   04/16/15 0809 Given   piperacillin-tazobactam (ZOSYN) IVPB 3.375 g 3.375 g 12.5 mL/hr   04/16/15 1024 Given   valACYclovir (VALTREX) tablet 1,000 mg 1,000 mg    04/16/15 1638 Given   piperacillin-tazobactam (ZOSYN) IVPB 3.375 g 3.375 g 12.5 mL/hr   04/16/15 1638 Given   vancomycin (VANCOCIN) IVPB 750 mg/150 ml premix 750 mg 150 mL/hr   04/16/15 1829 Given   valACYclovir (VALTREX) tablet 1,000 mg 1,000 mg    04/16/15 2148 Given   valACYclovir (VALTREX) tablet 1,000 mg 1,000 mg    04/16/15 2354 Given   piperacillin-tazobactam (ZOSYN) IVPB 3.375 g 3.375 g 12.5 mL/hr   04/17/15 0827 Given   piperacillin-tazobactam (ZOSYN) IVPB 3.375 g 3.375 g 12.5 mL/hr   04/17/15 1134 Given   valACYclovir (VALTREX) tablet 1,000 mg 1,000 mg       Principal Problem:   Critical lower limb ischemia Active Problems:   Postprocedural fever   Herpes zoster keratoconjunctivitis   HTN (hypertension)   Constipation   Hypokalemia   Abdominal pain   Hypoxia   Osteomyelitis (HCC)    Time spent: 25  min    Nayzeth Altman  [facility] Pager [phone]. If 7PM-7AM, please contact night-coverage at [email or web address], password [facility] 04/17/2015, 1:09 PM  LOS: 3 days

## 2015-04-17 NOTE — Progress Notes (Signed)
Patient is having frequent bowel movements this shift. Patient on lactulose and colace. Will let the incoming nurse let the MD reassess.

## 2015-04-18 ENCOUNTER — Other Ambulatory Visit (HOSPITAL_COMMUNITY): Payer: Self-pay | Admitting: Orthopedic Surgery

## 2015-04-18 DIAGNOSIS — R5082 Postprocedural fever: Secondary | ICD-10-CM

## 2015-04-18 DIAGNOSIS — K5909 Other constipation: Secondary | ICD-10-CM

## 2015-04-18 LAB — CULTURE, BLOOD (ROUTINE X 2)
CULTURE: NO GROWTH
Culture: NO GROWTH

## 2015-04-18 LAB — BASIC METABOLIC PANEL
Anion gap: 9 (ref 5–15)
BUN: 9 mg/dL (ref 6–20)
CALCIUM: 8.7 mg/dL — AB (ref 8.9–10.3)
CHLORIDE: 95 mmol/L — AB (ref 101–111)
CO2: 30 mmol/L (ref 22–32)
CREATININE: 0.48 mg/dL (ref 0.44–1.00)
GFR calc Af Amer: >60 mL/min (ref >60)
GFR calc non Af Amer: >60 mL/min (ref >60)
Glucose, Bld: 106 mg/dL — ABNORMAL HIGH (ref 65–99)
Potassium: 3.9 mmol/L (ref 3.5–5.1)
SODIUM: 134 mmol/L — AB (ref 135–145)

## 2015-04-18 LAB — CBC
HCT: 30.4 % — ABNORMAL LOW (ref 36.0–46.0)
HEMOGLOBIN: 9.5 g/dL — AB (ref 12.0–15.0)
MCH: 29.1 pg (ref 26.0–34.0)
MCHC: 31.3 g/dL (ref 30.0–36.0)
MCV: 93 fL (ref 78.0–100.0)
Platelets: 244 10*3/uL (ref 150–400)
RBC: 3.27 MIL/uL — ABNORMAL LOW (ref 3.87–5.11)
RDW: 14.9 % (ref 11.5–15.5)
WBC: 8.3 10*3/uL (ref 4.0–10.5)

## 2015-04-18 LAB — VANCOMYCIN, TROUGH: VANCOMYCIN TR: 6 ug/mL — AB (ref 10.0–20.0)

## 2015-04-18 MED ORDER — TICAGRELOR 90 MG PO TABS
90.0000 mg | ORAL_TABLET | Freq: Two times a day (BID) | ORAL | Status: DC
  Administered 2015-04-20 – 2015-04-22 (×4): 90 mg via ORAL
  Filled 2015-04-18 (×4): qty 1

## 2015-04-18 MED ORDER — CILOSTAZOL 50 MG PO TABS
50.0000 mg | ORAL_TABLET | Freq: Every day | ORAL | Status: DC
  Administered 2015-04-21 – 2015-04-22 (×2): 50 mg via ORAL
  Filled 2015-04-18 (×2): qty 1

## 2015-04-18 MED ORDER — VALACYCLOVIR HCL 500 MG PO TABS
1000.0000 mg | ORAL_TABLET | Freq: Two times a day (BID) | ORAL | Status: DC
  Administered 2015-04-18 – 2015-04-21 (×6): 1000 mg via ORAL
  Filled 2015-04-18 (×8): qty 2

## 2015-04-18 MED ORDER — VANCOMYCIN HCL IN DEXTROSE 750-5 MG/150ML-% IV SOLN
750.0000 mg | Freq: Two times a day (BID) | INTRAVENOUS | Status: DC
Start: 2015-04-19 — End: 2015-04-21
  Administered 2015-04-19 – 2015-04-21 (×5): 750 mg via INTRAVENOUS
  Filled 2015-04-18 (×5): qty 150

## 2015-04-18 NOTE — Progress Notes (Signed)
Physical Therapy Treatment Patient Details Name: Shannon Rice MRN: [id number] DOB: [birth year] Today's Date: 04/18/2015    History of Present Illness Pt is an 81 y/o female with a PMH that includes HTN, lupus, PVD. Pt initially presented 10/4 for angiogram and was scheduled for d/c. This was held due to low grade fevers and concern for osteomyelitis of the L great toe. Pt to see Dr. Lajoyce Corners on 10/10.    PT Comments    Pt anticipating BKA on 04/20/15 so practiced transfers and short distance ambulation keeping LLE NWB. Pt with sufficient strength to stand on RLE but fatigues quickly once using RW and moving. After surgery will need w/c for distance mobility and some form of post acute rehab.    Follow Up Recommendations  Home health PT;Supervision for mobility/OOB, will leave for now since pt has not yet had surgery, though will likely need post acute rehab after surgery.      Equipment Recommendations  Rolling walker with 5" wheels;3in1 (PT)    Recommendations for Other Services       Precautions / Restrictions Precautions Precautions: Fall Restrictions Weight Bearing Restrictions: Yes LLE Weight Bearing: Weight bearing as tolerated    Mobility  Bed Mobility Overal bed mobility: Needs Assistance Bed Mobility: Supine to Sit Rolling: Supervision         General bed mobility comments: able to get to EOB with HOB elevated and use of bedrail, no physical assist needed  Transfers Overall transfer level: Needs assistance Equipment used: Rolling walker (2 wheeled) Transfers: Sit to/from Stand Sit to Stand: Min assist         General transfer comment: vc's for hand placement, discussed what mobility will be like after BKA and tried to simulate this for practice so stood with only R foot on floor and was able to stand safely with min A.   Ambulation/Gait Ambulation/Gait assistance: Min assist Ambulation Distance (Feet): 3 Feet Assistive device: Rolling walker (2  wheeled) Gait Pattern/deviations: Step-to pattern Gait velocity: decraesed   General Gait Details: therapist assisted pt in keeping LLE NWB as pt had difficulty holding it off floor. Pt was able to take 2-3 hops with RLE and RW with min A and then fatigued and was able to slide right foot fwd with wt through RW. Fatigued quickly and had difficulty moving bkwds   Information systems manager [place] (Stroke Patients Only)       Balance Overall balance assessment: Needs assistance Sitting-balance support: No upper extremity supported Sitting balance-Leahy Scale: Good     Standing balance support: Bilateral upper extremity supported Standing balance-Leahy Scale: Poor Standing balance comment: unsteady in standing due to lack of sensation and proprioception bilateral feet                    Cognition Arousal/Alertness: Awake/alert Behavior During Therapy: WFL for tasks assessed/performed Overall Cognitive Status: Within Functional Limits for tasks assessed                      Exercises      General Comments General comments (skin integrity, edema, etc.): discussed what to expect after surgery and equipment that will be needed including w/c and RW.  Pt verbalized understanding. Also discussed preparation for prosthesis.       Pertinent Vitals/Pain Pain Assessment: 0-10 Pain Score: 4  Pain Location: LLE Pain Intervention(s): Limited activity within patient's tolerance;Monitored  during session    Home Living                      Prior Function            PT Goals (current goals can now be found in the care plan section) Acute Rehab PT Goals Patient Stated Goal: Decrease pain PT Goal Formulation: With patient/family Time For Goal Achievement: 04/30/15 Potential to Achieve Goals: Good Progress towards PT goals: Progressing toward goals    Frequency  Min 3X/week    PT Plan Current plan remains appropriate     Co-evaluation             End of Session Equipment Utilized During Treatment: Gait belt Activity Tolerance: Patient tolerated treatment well Patient left: in chair;with call bell/phone within reach;with family/visitor present     Time: 1610-9604 PT Time Calculation (min) (ACUTE ONLY): 25 min  Charges:  $Therapeutic Activity: 23-37 mins                    G Codes:     Lyanne Co, PT  Acute Rehab Services  [phone]  Lyanne Co 04/18/2015, 11:25 AM

## 2015-04-18 NOTE — Progress Notes (Signed)
Utilization review completed.  

## 2015-04-18 NOTE — Care Management Note (Signed)
Case Management Note CM note started by Raynald Blend RNCM  Patient Details  Name: Shannon Rice MRN: [id number] Date of Birth: 02/16/1931  Subjective/Objective:              Admitted with PAD s/p angiogram 10/4, history of scleroderma, peripheral vascular disease, dyslipidemia. Lives alone, has supportive daughter.   Action/Plan: Return to home when medically stable. CM to f/u with d/c needs.  Expected Discharge Date:                  Expected Discharge Plan:  Skilled Nursing Facility  In-House Referral:     Discharge planning Services  CM Consult  Post Acute Care Choice:    Choice offered to:     DME Arranged:    DME Agency:     HH Arranged:    HH Agency:     Status of Service:  In process, will continue to follow  Medicare Important Message Given:  Yes-second notification given Date Medicare IM Given:    Medicare IM give by:    Date Additional Medicare IM Given:    Additional Medicare Important Message give by:     If discussed at Long Length of Stay Meetings, dates discussed:  04/19/15  Additional Comments: 04/18/2015 04/18/15- Donn Pierini RN, BSN - pt continues on IV abx, plan for OR on wed. 04/20/15 for transtibial amputation. NCM to continue to follow for d/c needs- pt may need STSNF vs home with HH post op    CM submitted beneifts check.  CM assessed pt, pt is independent without HH or DME  from home per daughter and pt.  Daughter is at bedside. Benefit check yielded: Pt copay will be $24- prior auth is not required. Pt already takes this med- this will be her last purchase from retail before she has to start mail order- her mail order amount will be $20. She is only allowed to purchase this med 2x from retail.  Pt stated her preferred pharmacy was [facility] on [address]  CM contacted pharmacy and was informed that a partial fill could be made immediately and next day delivery would be available for remainder.  CM asked pharmacist if pt has  previously filled drug;  per pharmacy [facility] submitted script on 10/5 and is ready for pick up (this is the 1st purchase mentioned above) .  Prior to this pt has never filled drug.  CM explained to pt and daughter that she would have one additional fill available from [facility] after she picks up immediately post discharge, after that they would have to use mail order.  Pts daughter stated she understood.   Para March (Daughter)  [phone]  Donn Pierini [place], [place] [phone] 04/18/2015, 4:12 PM

## 2015-04-18 NOTE — Consult Note (Signed)
Reason for Consult: Gangrene left foot Referring Physician: Dr. Verdell Face Shannon Rice is an 81 y.o. female.  HPI: Patient is an 81 year old woman with severe peripheral vascular disease who is status post revascularization with angioplasty and arthrectomy. Patient is seen for evaluation for chronic gangrene with worst involvement of the left great toe.  Past Medical History  Diagnosis Date  . Hypertension   . GERD (gastroesophageal reflux disease)   . Lupus (HCC)     skin, scleraderma  . Scleroderma ([place])   . Arthritis     scleroderma  . Peripheral vascular disease [facility])     Past Surgical History  Procedure Laterality Date  . Abdominal hysterectomy    . Coronary angioplasty      Dr Tami Ribas  . Sympathectomy Right 01/27/2015    Procedure: RIGHT RADICAL ARTERY SYMPATHECTOMY, RIGHT ULNAR ARTERY SYMPATHECTOMY, DIGITAL SYMPATHECTOMY RIGHT INDEX, MIDDLE, RING SMALL;  Surgeon: Daryll Brod, MD;  Location: [place];  Service: Orthopedics;  Laterality: Right;  . Amputation Right 01/27/2015    Procedure: AMPUTATION DISTAL TIP RIGHT INDEX FINGER;  Surgeon: Daryll Brod, MD;  Location: [place];  Service: Orthopedics;  Laterality: Right;  . Peripheral vascular catheterization N/A 02/15/2015    Procedure: Lower Extremity Angiography;  Surgeon: Adrian Prows, MD;  Location: [place] [facility];  Service: Cardiovascular;  Laterality: N/A;  . Peripheral vascular catheterization Left 04/12/2015    Procedure: Lower Extremity Angiography;  Surgeon: Adrian Prows, MD;  Location: [place] [facility];  Service: Cardiovascular;  Laterality: Left;  . Peripheral vascular catheterization Left 04/12/2015    Procedure: Peripheral Vascular Atherectomy;  Surgeon: Adrian Prows, MD;  Location: [place] [facility];  Service: Cardiovascular;  Laterality: Left;  sfa       Family History  Problem Relation Age of Onset  . Diabetes Sister   . Diabetes Brother     Social History:  reports that she  quit smoking about 4 months ago. Her smoking use included Cigarettes. She has a 15 pack-year smoking history. She has never used smokeless tobacco. She reports that she drinks alcohol. She reports that she does not use illicit drugs.  Allergies: No Known Allergies  Medications: I have reviewed the patient's current medications.  Results for orders placed or performed during the hospital encounter of 04/12/15 (from the past 48 hour(s))  Basic metabolic panel     Status: Abnormal   Collection Time: 04/17/15  3:27 AM  Result Value Ref Range   Sodium 134 (L) 135 - 145 mmol/L   Potassium 3.3 (L) 3.5 - 5.1 mmol/L   Chloride 92 (L) 101 - 111 mmol/L   CO2 29 22 - 32 mmol/L   Glucose, Bld 131 (H) 65 - 99 mg/dL   BUN <5 (L) 6 - 20 mg/dL   Creatinine, Ser 0.33 (L) 0.44 - 1.00 mg/dL   Calcium 8.8 (L) 8.9 - 10.3 mg/dL   GFR calc non Af Amer >60 >60 mL/min   GFR calc Af Amer >60 >60 mL/min    Comment: (NOTE) The eGFR has been calculated using the CKD EPI equation. This calculation has not been validated in all clinical situations. eGFR's persistently <60 mL/min signify possible Chronic Kidney Disease.    Anion gap 13 5 - 15  CBC     Status: Abnormal   Collection Time: 04/17/15  3:27 AM  Result Value Ref Range   WBC 9.4 4.0 - 10.5 K/uL   RBC 3.40 (L) 3.87 - 5.11 MIL/uL  Hemoglobin 10.2 (L) 12.0 - 15.0 g/dL   HCT 32.1 (L) 36.0 - 46.0 %   MCV 94.4 78.0 - 100.0 fL   MCH 30.0 26.0 - 34.0 pg   MCHC 31.8 30.0 - 36.0 g/dL   RDW 14.7 11.5 - 15.5 %   Platelets 213 150 - 400 K/uL  CBC     Status: Abnormal   Collection Time: 04/18/15  4:25 AM  Result Value Ref Range   WBC 8.3 4.0 - 10.5 K/uL   RBC 3.27 (L) 3.87 - 5.11 MIL/uL   Hemoglobin 9.5 (L) 12.0 - 15.0 g/dL   HCT 30.4 (L) 36.0 - 46.0 %   MCV 93.0 78.0 - 100.0 fL   MCH 29.1 26.0 - 34.0 pg   MCHC 31.3 30.0 - 36.0 g/dL   RDW 14.9 11.5 - 15.5 %   Platelets 244 150 - 400 K/uL  Basic metabolic panel     Status: Abnormal   Collection Time:  04/18/15  4:25 AM  Result Value Ref Range   Sodium 134 (L) 135 - 145 mmol/L   Potassium 3.9 3.5 - 5.1 mmol/L   Chloride 95 (L) 101 - 111 mmol/L   CO2 30 22 - 32 mmol/L   Glucose, Bld 106 (H) 65 - 99 mg/dL   BUN 9 6 - 20 mg/dL   Creatinine, Ser 0.48 0.44 - 1.00 mg/dL   Calcium 8.7 (L) 8.9 - 10.3 mg/dL   GFR calc non Af Amer >60 >60 mL/min   GFR calc Af Amer >60 >60 mL/min    Comment: (NOTE) The eGFR has been calculated using the CKD EPI equation. This calculation has not been validated in all clinical situations. eGFR's persistently <60 mL/min signify possible Chronic Kidney Disease.    Anion gap 9 5 - 15    Dg Chest 2 View  04/17/2015   CLINICAL DATA:  Hospital acquired pneumonia.  Follow-up.  EXAM: CHEST  2 VIEW  COMPARISON:  04/15/2015.  04/13/2015.  05/13/2014.  FINDINGS: Chronic cardiomegaly and aortic atherosclerosis. Chronic pulmonary hyperinflation. Patchy infiltrates in the perihilar regions right more than left are less dense. Effusions and basilar atelectasis are improved. No worsening or new finding.  IMPRESSION: Radiographic improvement with decreased density of perihilar infiltrates right more than left and reduction in size of effusions with basilar atelectasis.   Electronically Signed   By: Nelson Chimes M.D.   On: 04/17/2015 08:52    Review of Systems  All other systems reviewed and are negative.  Blood pressure 142/63, pulse 88, temperature 98.8 F (37.1 C), temperature source Oral, resp. rate 20, height _0  (1.575 m), weight 42.8 kg (94 lb 5.7 oz), SpO2 100 %. Physical Exam On examination patient has good warmth down to her ankle on the left her foot is painful, cold, she has dry gangrenous changes to the left great toe with black gangrenous changes to the midfoot with the entire foot being extremely cold. There are no palpable pulses. Assessment/Plan: Assessment: Severe peripheral vascular disease status post revascularization with good warmth in the left leg with  a cold gangrenous left foot.  Plan: Patient does not have any foot salvage options. Have recommended proceeding with a transtibial amputation versus observation. Patient states that due to the ischemic pain in her foot she would like to proceed with surgical intervention. We will plan for transtibial amputation on Wednesday.  Susana Duell V 04/18/2015, 7:24 AM

## 2015-04-18 NOTE — Progress Notes (Signed)
PROGRESS NOTE  Shannon Rice ZHY:[id number] DOB: 02-Nov-1930 DOA: 04/12/2015 PCP: Ralene Ok, MD  81 year old female with a history of scleroderma, peripheral vascular disease, dyslipidemia, status post angiogram on 10/4 by Dr.GANJI, JAY. Anticipated to be discharged from [facility] but discharge canceled because of medical complications. Hospitalist consult requested to take over because of low-grade fever of 100.2, hypoxic respiratory failure with oxygen saturation in the 70s on room air.  Patient has ulcer on left toe that has possible osteo-- seen by ortho   Assessment/Plan: Postprocedural fever. . -x ray shows possible osteo--- called ortho  - Blood cultures obtained on 04/13/2015 showed no growth to date - Urinalysis negative - CXR performed on 04/15/2015 showing multifocal airspace opacities in the right lung which may represent areas of alveolar edema versus infection. -diuresed about 6.5L - Continue vancomycin and Zosyn for HCAP and gangrenous left great toe/possible osteomyelitis. Repeat CXR in 4-6 weeks  Hypoxia.  -Improved patient titrated off supplemental oxygen as her V/Q scan negative-- duplex negative for DVT  HTN, stable Continue home anti-hypertensives  Hypokalemia.  -repleted   Herpes zoster keratoconjunctivitis.  -On valtrex   Critical lower limb ischemia, patient is s/p revascularization of / thrombectomy of the left peroneal artery. On Pletal at home, Brilinta just added. For transtibial amputation Wednesday  Weight loss/ severe protein calorie malnutrition. Nutritionist seeing now. She has lost approx 15 pounds over last several months- see a  Constipation- improved  Code Status: full Family Communication: daughter at bedside.  Disposition Plan: home with daughter? SNF? PT will have to reevaluate post amputation   Consultants:  Cards  ortho  Procedures:  See above  HPI/Subjective: Pain slightly improved. Some cough, some  dyspnea  Objective: Filed Vitals:   04/18/15 0929  BP: 121/37  Pulse: 85  Temp:   Resp:     Intake/Output Summary (Last 24 hours) at 04/18/15 1307 Last data filed at 04/18/15 0823  Gross per 24 hour  Intake    220 ml  Output      0 ml  Net    220 ml   Filed Weights   04/12/15 0932 04/13/15 0333 04/14/15 0337  Weight: 43.092 kg (95 lb) 43 kg (94 lb 12.8 oz) 42.8 kg (94 lb 5.7 oz)   tele: sinus 94   Exam:   General:  Awake and alert. In chair. Comfortable. forgetful  Cardiovascular: rrr  Respiratory: CTA without WRR  Abdomen: +BS, soft, NT, ND Musculoskeletal: gangrenous left great toe  Data Reviewed: Basic Metabolic Panel:  Recent Labs Lab 04/14/15 0402 04/15/15 1220 04/16/15 0341 04/17/15 0327 04/18/15 0425  NA 135 137 135 134* 134*  K 4.2 3.8 3.1* 3.3* 3.9  CL 101 102 94* 92* 95*  CO2 28 28 33* 29 30  GLUCOSE 106* 116* 90 131* 106*  BUN 12 8 <5* <5* 9  CREATININE 0.54 0.40* 0.37* 0.33* 0.48  CALCIUM 7.9* 8.4* 8.1* 8.8* 8.7*   Liver Function Tests:  Recent Labs Lab 04/13/15 1515  AST 23  ALT 10*  ALKPHOS 52  BILITOT 0.6  PROT 5.6*  ALBUMIN 2.7*    Recent Labs Lab 04/13/15 1515  LIPASE 13*   No results for input(s): AMMONIA in the last 168 hours. CBC:  Recent Labs Lab 04/13/15 2331 04/14/15 0402 04/16/15 0341 04/17/15 0327 04/18/15 0425  WBC 10.5 10.8* 9.4 9.4 8.3  HGB 8.1* 9.0* 8.4* 10.2* 9.5*  HCT 25.9* 28.7* 26.5* 32.1* 30.4*  MCV 93.5 94.4 94.3 94.4 93.0  PLT 148* 150 180  213 244   Cardiac Enzymes: No results for input(s): CKTOTAL, CKMB, CKMBINDEX, TROPONINI in the last 168 hours. BNP (last 3 results) No results for input(s): BNP in the last 8760 hours.  ProBNP (last 3 results) No results for input(s): PROBNP in the last 8760 hours.  CBG: No results for input(s): GLUCAP in the last 168 hours.  Recent Results (from the past 240 hour(s))  Culture, blood (routine x 2)     Status: None (Preliminary result)    Collection Time: 04/13/15  5:10 PM  Result Value Ref Range Status   Specimen Description BLOOD LEFT ANTECUBITAL  Final   Special Requests BOTTLES DRAWN AEROBIC ONLY 10CC  Final   Culture NO GROWTH 4 DAYS  Final   Report Status PENDING  Incomplete  Culture, blood (routine x 2)     Status: None (Preliminary result)   Collection Time: 04/13/15  5:20 PM  Result Value Ref Range Status   Specimen Description BLOOD RIGHT HAND  Final   Special Requests BOTTLES DRAWN AEROBIC ONLY 5CC  Final   Culture NO GROWTH 4 DAYS  Final   Report Status PENDING  Incomplete     Studies: Dg Chest 2 View  04/17/2015   CLINICAL DATA:  Hospital acquired pneumonia.  Follow-up.  EXAM: CHEST  2 VIEW  COMPARISON:  04/15/2015.  04/13/2015.  05/13/2014.  FINDINGS: Chronic cardiomegaly and aortic atherosclerosis. Chronic pulmonary hyperinflation. Patchy infiltrates in the perihilar regions right more than left are less dense. Effusions and basilar atelectasis are improved. No worsening or new finding.  IMPRESSION: Radiographic improvement with decreased density of perihilar infiltrates right more than left and reduction in size of effusions with basilar atelectasis.   Electronically Signed   By: Paulina Fusi M.D.   On: 04/17/2015 08:52    Scheduled Meds: . amLODipine  5 mg Oral Daily  . antiseptic oral rinse  7 mL Mouth Rinse BID  . aspirin EC  81 mg Oral Daily  . benazepril  10 mg Oral Daily  . cilostazol  50 mg Oral Daily  . collagenase  1 application Topical Q1200  . docusate sodium  100 mg Oral BID  . erythromycin  1 application Both Eyes QHS  . feeding supplement (ENSURE ENLIVE)  237 mL Oral BID BM  . metoprolol tartrate  25 mg Oral BID  . piperacillin-tazobactam (ZOSYN)  IV  3.375 g Intravenous Q8H  . polyethylene glycol  17 g Oral Daily  . pravastatin  80 mg Oral q1800  . ticagrelor  90 mg Oral BID  . valACYclovir  1,000 mg Oral Q12H  . vancomycin  750 mg Intravenous Q24H   Continuous Infusions:    Antibiotics Given (last 72 hours)    Date/Time Action Medication Dose Rate   04/15/15 1620 Given   valACYclovir (VALTREX) tablet 1,000 mg 1,000 mg    04/15/15 1626 Given   vancomycin (VANCOCIN) IVPB 750 mg/150 ml premix 750 mg 150 mL/hr   04/15/15 1753 Given   piperacillin-tazobactam (ZOSYN) IVPB 3.375 g 3.375 g 12.5 mL/hr   04/15/15 2114 Given   valACYclovir (VALTREX) tablet 1,000 mg 1,000 mg    04/16/15 0030 Given   piperacillin-tazobactam (ZOSYN) IVPB 3.375 g 3.375 g 12.5 mL/hr   04/16/15 0809 Given   piperacillin-tazobactam (ZOSYN) IVPB 3.375 g 3.375 g 12.5 mL/hr   04/16/15 1024 Given   valACYclovir (VALTREX) tablet 1,000 mg 1,000 mg    04/16/15 1638 Given   piperacillin-tazobactam (ZOSYN) IVPB 3.375 g 3.375 g 12.5 mL/hr  04/16/15 1638 Given   vancomycin (VANCOCIN) IVPB 750 mg/150 ml premix 750 mg 150 mL/hr   04/16/15 1829 Given   valACYclovir (VALTREX) tablet 1,000 mg 1,000 mg    04/16/15 2148 Given   valACYclovir (VALTREX) tablet 1,000 mg 1,000 mg    04/16/15 2354 Given   piperacillin-tazobactam (ZOSYN) IVPB 3.375 g 3.375 g 12.5 mL/hr   04/17/15 0827 Given   piperacillin-tazobactam (ZOSYN) IVPB 3.375 g 3.375 g 12.5 mL/hr   04/17/15 1134 Given   valACYclovir (VALTREX) tablet 1,000 mg 1,000 mg    04/17/15 1638 Given   valACYclovir (VALTREX) tablet 1,000 mg 1,000 mg    04/17/15 1657 Given   piperacillin-tazobactam (ZOSYN) IVPB 3.375 g 3.375 g 12.5 mL/hr   04/17/15 1657 Given   vancomycin (VANCOCIN) IVPB 750 mg/150 ml premix 750 mg 150 mL/hr   04/17/15 2122 Given   valACYclovir (VALTREX) tablet 1,000 mg 1,000 mg    04/18/15 0154 Given   piperacillin-tazobactam (ZOSYN) IVPB 3.375 g 3.375 g 12.5 mL/hr   04/18/15 1610 Given   piperacillin-tazobactam (ZOSYN) IVPB 3.375 g 3.375 g 12.5 mL/hr   04/18/15 0930 Given   valACYclovir (VALTREX) tablet 1,000 mg 1,000 mg       Principal Problem:   Critical lower limb ischemia Active Problems:   Postprocedural fever   Herpes  zoster keratoconjunctivitis   HTN (hypertension)   Constipation   Hypokalemia   Abdominal pain   Hypoxia   Osteomyelitis (HCC)  Time spent: 25 min  Caidynce Muzyka L  [facility] [email or web address], password [facility] 04/18/2015, 1:07 PM  LOS: 4 days

## 2015-04-18 NOTE — Progress Notes (Signed)
ANTIBIOTIC CONSULT NOTE - FOLLOW UP  Pharmacy Consult for Vancomycin and Zosyn Indication: rule out sepsis and osteomyelitis  No Known Allergies  Patient Measurements: Height:  (157.5 cm) Weight: 94 lb 5.7 oz (42.8 kg) IBW/kg (Calculated) : 50.1 Adjusted Body Weight:   Vital Signs: Temp: 98.4 F (36.9 C) (10/10 1342) Temp Source: Oral (10/10 1342) BP: 119/44 mmHg (10/10 1342) Pulse Rate: 82 (10/10 1342) Intake/Output from previous day: 10/09 0701 - 10/10 0700 In: 580 [P.O.:580] Out: 552 [Urine:550; Stool:2] Intake/Output from this shift: Total I/O In: 120 [P.O.:120] Out: 2 [Urine:1; Stool:1]  Labs:  Recent Labs  04/16/15 0341 04/17/15 0327 04/18/15 0425  WBC 9.4 9.4 8.3  HGB 8.4* 10.2* 9.5*  PLT 180 213 244  CREATININE 0.37* 0.33* 0.48   Estimated Creatinine Clearance: 36.6 mL/min (by C-G formula based on Cr of 0.48).  Recent Labs  04/18/15 1602  VANCOTROUGH 6*     Microbiology: Recent Results (from the past 720 hour(s))  Culture, blood (routine x 2)     Status: None   Collection Time: 04/13/15  5:10 PM  Result Value Ref Range Status   Specimen Description BLOOD LEFT ANTECUBITAL  Final   Special Requests BOTTLES DRAWN AEROBIC ONLY 10CC  Final   Culture NO GROWTH 5 DAYS  Final   Report Status 04/18/2015 FINAL  Final  Culture, blood (routine x 2)     Status: None   Collection Time: 04/13/15  5:20 PM  Result Value Ref Range Status   Specimen Description BLOOD RIGHT HAND  Final   Special Requests BOTTLES DRAWN AEROBIC ONLY 5CC  Final   Culture NO GROWTH 5 DAYS  Final   Report Status 04/18/2015 FINAL  Final    Medical History: Past Medical History  Diagnosis Date  . Hypertension   . GERD (gastroesophageal reflux disease)   . Lupus (HCC)     skin, scleraderma  . Scleroderma (HCC)   . Arthritis     scleroderma  . Peripheral vascular disease (HCC)     Medications:  Prescriptions prior to admission  Medication Sig Dispense Refill Last Dose   . acetaminophen (TYLENOL) 500 MG tablet Take 1,000 mg by mouth every 6 (six) hours as needed for moderate pain or headache.   Past Week at Unknown time  . amLODipine-benazepril (LOTREL) 5-20 MG per capsule Take 1 capsule by mouth daily.   04/12/2015 at Unknown time  . aspirin EC 325 MG tablet Take 325 mg by mouth daily.   04/12/2015 at Unknown time  . cilostazol (PLETAL) 50 MG tablet Take 50 mg by mouth daily.  0 04/11/2015 at Unknown time  . diphenhydrAMINE-zinc acetate (BENADRYL) cream Apply 1 application topically 3 (three) times daily as needed for itching.   04/11/2015 at Unknown time  . erythromycin ophthalmic ointment Place 1 application into both eyes at bedtime.  0 Past Week at Unknown time  . metoprolol tartrate (LOPRESSOR) 25 MG tablet Take 25 mg by mouth 2 (two) times daily.   04/12/2015 at 0700  . oxyCODONE-acetaminophen (PERCOCET/ROXICET) 5-325 MG tablet Take 1 tablet by mouth every 4 (four) hours as needed.  0 04/12/2015 at 0200  . Pitavastatin Calcium (LIVALO) 2 MG TABS Take 2 mg by mouth 2 (two) times daily.   Past Week at Unknown time  . SANTYL ointment Apply 1 application topically daily at 12 noon.  0 Past Week at Unknown time  . valACYclovir (VALTREX) 1000 MG tablet Take 1,000 mg by mouth 3 (three) times daily.  0 04/11/2015 at Unknown time  . HYDROcodone-acetaminophen (NORCO) 5-325 MG per tablet Take 1 tablet by mouth every 6 (six) hours as needed for moderate pain. (Patient not taking: Reported on 04/11/2015) 30 tablet 0 02/15/2015 at 0330  . ibuprofen (ADVIL,MOTRIN) 200 MG tablet Take 400 mg by mouth every 6 (six) hours as needed for headache or moderate pain.   02/12/2015  . Pitavastatin Calcium 4 MG TABS Take 2 mg by mouth 2 (two) times daily.   02/12/2015   Scheduled:  . amLODipine  5 mg Oral Daily  . antiseptic oral rinse  7 mL Mouth Rinse BID  . aspirin EC  81 mg Oral Daily  . benazepril  10 mg Oral Daily  . cilostazol  50 mg Oral Daily  . [START ON 04/21/2015] cilostazol  50 mg  Oral Daily  . collagenase  1 application Topical Q1200  . docusate sodium  100 mg Oral BID  . erythromycin  1 application Both Eyes QHS  . feeding supplement (ENSURE ENLIVE)  237 mL Oral BID BM  . metoprolol tartrate  25 mg Oral BID  . piperacillin-tazobactam (ZOSYN)  IV  3.375 g Intravenous Q8H  . polyethylene glycol  17 g Oral Daily  . pravastatin  80 mg Oral q1800  . ticagrelor  90 mg Oral BID  . [START ON 04/20/2015] ticagrelor  90 mg Oral BID  . valACYclovir  1,000 mg Oral Q12H  . vancomycin  750 mg Intravenous Q24H   Infusions:    Assessment: 81yo female with history of scleroderma, PVD and HLD was here for peripheral arteriogram. Currently on D#6 of vancomycin and zosyn for suspected sepsis and osteomyelitis.   VT is subtherapeutic at 6 mcg/mL on vancomycin  q24h with level drawn appropriately. Pt is afebrile, WBC 8.3, sCr 0.48 and ngtd on blood cultures.  Goal of Therapy:  Vancomycin trough level 15-20 mcg/ml  Plan:  Increase vancomycin to  IV q12h Continue zosyn 3.375g IV q8h Monitor CBC, renal fx, cultures and clinical progress VT prn  Arlean Hopping. Newman Pies, PharmD Clinical Pharmacist Pager [phone]

## 2015-04-19 DIAGNOSIS — E43 Unspecified severe protein-calorie malnutrition: Secondary | ICD-10-CM | POA: Insufficient documentation

## 2015-04-19 LAB — CBC WITH DIFFERENTIAL/PLATELET
BASOS ABS: 0 10*3/uL (ref 0.0–0.1)
BASOS PCT: 0 %
EOS PCT: 3 %
Eosinophils Absolute: 0.2 10*3/uL (ref 0.0–0.7)
HCT: 28.4 % — ABNORMAL LOW (ref 36.0–46.0)
Hemoglobin: 8.8 g/dL — ABNORMAL LOW (ref 12.0–15.0)
LYMPHS PCT: 14 %
Lymphs Abs: 0.9 10*3/uL (ref 0.7–4.0)
MCH: 29.2 pg (ref 26.0–34.0)
MCHC: 31 g/dL (ref 30.0–36.0)
MCV: 94.4 fL (ref 78.0–100.0)
MONO ABS: 0.6 10*3/uL (ref 0.1–1.0)
Monocytes Relative: 9 %
NEUTROS ABS: 4.7 10*3/uL (ref 1.7–7.7)
Neutrophils Relative %: 74 %
PLATELETS: 220 10*3/uL (ref 150–400)
RBC: 3.01 MIL/uL — AB (ref 3.87–5.11)
RDW: 15.3 % (ref 11.5–15.5)
WBC: 6.5 10*3/uL (ref 4.0–10.5)

## 2015-04-19 LAB — COMPREHENSIVE METABOLIC PANEL
ALBUMIN: 2.6 g/dL — AB (ref 3.5–5.0)
ALK PHOS: 53 U/L (ref 38–126)
ALT: 11 U/L — ABNORMAL LOW (ref 14–54)
ANION GAP: 7 (ref 5–15)
AST: 20 U/L (ref 15–41)
BILIRUBIN TOTAL: 0.6 mg/dL (ref 0.3–1.2)
BUN: 14 mg/dL (ref 6–20)
CALCIUM: 8.3 mg/dL — AB (ref 8.9–10.3)
CO2: 32 mmol/L (ref 22–32)
Chloride: 94 mmol/L — ABNORMAL LOW (ref 101–111)
Creatinine, Ser: 0.67 mg/dL (ref 0.44–1.00)
GFR calc Af Amer: >60 mL/min (ref >60)
GLUCOSE: 137 mg/dL — AB (ref 65–99)
Potassium: 4.2 mmol/L (ref 3.5–5.1)
Sodium: 133 mmol/L — ABNORMAL LOW (ref 135–145)
TOTAL PROTEIN: 6.1 g/dL — AB (ref 6.5–8.1)

## 2015-04-19 LAB — ABO/RH: ABO/RH(D): AB POS

## 2015-04-19 LAB — PROTIME-INR
INR: 1.19 (ref 0.00–1.49)
PROTHROMBIN TIME: 15.3 s — AB (ref 11.6–15.2)

## 2015-04-19 MED ORDER — SODIUM CHLORIDE 0.45 % IV SOLN
INTRAVENOUS | Status: DC
  Administered 2015-04-19: 75 mL/h via INTRAVENOUS

## 2015-04-19 NOTE — Progress Notes (Addendum)
Initial Nutrition Assessment  DOCUMENTATION CODES:   Underweight, Severe malnutrition in context of chronic illness  INTERVENTION:   Continue Ensure Enlive po BID, each supplement provides 350 kcal and 20 grams of protein  Continue Magic cup TID with meals, each supplement provides 290 kcal and 9 grams of protein  NUTRITION DIAGNOSIS:   Malnutrition related to chronic illness as evidenced by severe depletion of body fat, severe depletion of muscle mass, ongoing  GOAL:   Patient will meet greater than or equal to 90% of their needs, progressing  MONITOR:   PO intake, Supplement acceptance, Labs, Weight trends, Skin, I & O's  ASSESSMENT:   Patient is an 81 year old female with scleroderma, hyperlipidemia, HTN and severe peripheral artery disease. She underwent balloon angioplasty of left perineal artery 10/4.   RD spoke with patient's daughter outside of room.  Daughter reports pt is not eating.  PO intake variable at 10-100% per flowsheet records.  Drinking a little of her Ensure Enlive supplements.  Malnutrition ongoing.  RD encouraged oral nutrition supplement consumption.  Noted pt for left transtibial amputation tomorrow, 10/12.  Diet Order:  Diet Heart Room service appropriate?: Yes; Fluid consistency:: Thin  Skin:  Wound (see comment) (Pressure ulcer right toe)  Last BM:  10/10  Height:   Ht Readings from Last 1 Encounters:  04/12/15  (1.575 m)   Weight:   Wt Readings from Last 1 Encounters:  04/14/15 94 lb 5.7 oz (42.8 kg)   Ideal Body Weight:  50 kg  BMI:  Body mass index is 17.25 kg/(m^2).  Estimated Nutritional Needs:   Kcal:  1200-1400  Protein:  60-70 grams  Fluid:  > 1.5 L/day  EDUCATION NEEDS:   No education needs identified at this time  Maureen Chatters, RD, LDN Pager #: [phone] After-Hours Pager #: [phone]

## 2015-04-19 NOTE — Progress Notes (Signed)
Patient ID: Shannon Rice, female   DOB: 02/20/1931, 81 y.o.   MRN: [id number] Plan for left transtibial amputation tomorrow on Wednesday. 

## 2015-04-20 ENCOUNTER — Encounter (HOSPITAL_COMMUNITY)
Admission: AD | Disposition: A | Discharge status: Skilled Nursing Facility | Payer: Self-pay | Source: Ambulatory Visit | Attending: Internal Medicine

## 2015-04-20 ENCOUNTER — Encounter (HOSPITAL_COMMUNITY): Payer: Self-pay | Admitting: Certified Registered"

## 2015-04-20 ENCOUNTER — Inpatient Hospital Stay (HOSPITAL_COMMUNITY): Payer: Medicare Other | Admitting: Anesthesiology

## 2015-04-20 DIAGNOSIS — I998 Other disorder of circulatory system: Secondary | ICD-10-CM

## 2015-04-20 HISTORY — PX: AMPUTATION: SHX166

## 2015-04-20 LAB — MRSA PCR SCREENING: MRSA BY PCR: NEGATIVE

## 2015-04-20 SURGERY — AMPUTATION BELOW KNEE
Anesthesia: General | Site: Leg Lower | Laterality: Left

## 2015-04-20 MED ORDER — 0.9 % SODIUM CHLORIDE (POUR BTL) OPTIME
TOPICAL | Status: DC | PRN
  Administered 2015-04-20: 1000 mL

## 2015-04-20 MED ORDER — FENTANYL CITRATE (PF) 250 MCG/5ML IJ SOLN
INTRAMUSCULAR | Status: AC
  Filled 2015-04-20: qty 5

## 2015-04-20 MED ORDER — PROPOFOL 10 MG/ML IV BOLUS
INTRAVENOUS | Status: AC
  Filled 2015-04-20: qty 20

## 2015-04-20 MED ORDER — METOCLOPRAMIDE HCL 5 MG PO TABS: 5.0000 mg | ORAL_TABLET | Freq: Three times a day (TID) | ORAL | Status: DC | PRN

## 2015-04-20 MED ORDER — LIDOCAINE HCL (CARDIAC) 20 MG/ML IV SOLN
INTRAVENOUS | Status: DC | PRN
  Administered 2015-04-20: 40 mg via INTRAVENOUS

## 2015-04-20 MED ORDER — LIDOCAINE HCL (CARDIAC) 20 MG/ML IV SOLN
INTRAVENOUS | Status: AC
  Filled 2015-04-20: qty 5

## 2015-04-20 MED ORDER — PROMETHAZINE HCL 25 MG/ML IJ SOLN: 6.2500 mg | INTRAMUSCULAR | Status: DC | PRN

## 2015-04-20 MED ORDER — PROPOFOL 10 MG/ML IV BOLUS
INTRAVENOUS | Status: DC | PRN
  Administered 2015-04-20: 50 mg via INTRAVENOUS

## 2015-04-20 MED ORDER — MORPHINE SULFATE (PF) 2 MG/ML IV SOLN
2.0000 mg | INTRAVENOUS | Status: DC | PRN
  Administered 2015-04-20: 2 mg via INTRAVENOUS
  Filled 2015-04-20 (×3): qty 1

## 2015-04-20 MED ORDER — METOCLOPRAMIDE HCL 5 MG/ML IJ SOLN: 5.0000 mg | Freq: Three times a day (TID) | INTRAMUSCULAR | Status: DC | PRN

## 2015-04-20 MED ORDER — METHOCARBAMOL 500 MG PO TABS
500.0000 mg | ORAL_TABLET | Freq: Four times a day (QID) | ORAL | Status: DC | PRN
  Administered 2015-04-20 – 2015-04-22 (×7): 500 mg via ORAL
  Filled 2015-04-20 (×8): qty 1

## 2015-04-20 MED ORDER — HYDROMORPHONE HCL 1 MG/ML IJ SOLN
INTRAMUSCULAR | Status: AC
  Filled 2015-04-20: qty 1

## 2015-04-20 MED ORDER — ONDANSETRON HCL 4 MG/2ML IJ SOLN
INTRAMUSCULAR | Status: DC | PRN
  Administered 2015-04-20: 4 mg via INTRAVENOUS

## 2015-04-20 MED ORDER — ONDANSETRON HCL 4 MG/2ML IJ SOLN: 4.0000 mg | Freq: Four times a day (QID) | INTRAMUSCULAR | Status: DC | PRN

## 2015-04-20 MED ORDER — MORPHINE SULFATE (PF) 2 MG/ML IV SOLN
2.0000 mg | Freq: Once | INTRAVENOUS | Status: AC
  Administered 2015-04-20: 2 mg via INTRAVENOUS

## 2015-04-20 MED ORDER — ACETAMINOPHEN 325 MG PO TABS: 650.0000 mg | ORAL_TABLET | Freq: Four times a day (QID) | ORAL | Status: DC | PRN

## 2015-04-20 MED ORDER — ONDANSETRON HCL 4 MG/2ML IJ SOLN
INTRAMUSCULAR | Status: AC
  Filled 2015-04-20: qty 4

## 2015-04-20 MED ORDER — METHOCARBAMOL 1000 MG/10ML IJ SOLN
500.0000 mg | Freq: Four times a day (QID) | INTRAVENOUS | Status: DC | PRN
  Filled 2015-04-20: qty 5

## 2015-04-20 MED ORDER — FENTANYL CITRATE (PF) 100 MCG/2ML IJ SOLN
25.0000 ug | INTRAMUSCULAR | Status: AC | PRN
  Administered 2015-04-20 (×4): 25 ug via INTRAVENOUS

## 2015-04-20 MED ORDER — LACTATED RINGERS IV SOLN
INTRAVENOUS | Status: DC
  Administered 2015-04-20 (×2): via INTRAVENOUS

## 2015-04-20 MED ORDER — HYDROMORPHONE HCL 1 MG/ML IJ SOLN
0.2500 mg | INTRAMUSCULAR | Status: DC | PRN
  Administered 2015-04-20 (×3): 0.25 mg via INTRAVENOUS

## 2015-04-20 MED ORDER — PHENYLEPHRINE 40 MCG/ML (10ML) SYRINGE FOR IV PUSH (FOR BLOOD PRESSURE SUPPORT)
PREFILLED_SYRINGE | INTRAVENOUS | Status: AC
  Filled 2015-04-20: qty 10

## 2015-04-20 MED ORDER — MORPHINE SULFATE (PF) 2 MG/ML IV SOLN
2.0000 mg | INTRAVENOUS | Status: DC | PRN
  Administered 2015-04-20: 2 mg via INTRAVENOUS
  Administered 2015-04-21 (×2): 4 mg via INTRAVENOUS
  Administered 2015-04-21: 3 mg via INTRAVENOUS
  Administered 2015-04-21: 2 mg via INTRAVENOUS
  Administered 2015-04-21 – 2015-04-22 (×4): 4 mg via INTRAVENOUS
  Filled 2015-04-20 (×2): qty 2
  Filled 2015-04-20: qty 1
  Filled 2015-04-20 (×3): qty 2
  Filled 2015-04-20: qty 1
  Filled 2015-04-20 (×2): qty 2

## 2015-04-20 MED ORDER — SODIUM CHLORIDE 0.9 % IV SOLN: INTRAVENOUS | Status: DC

## 2015-04-20 MED ORDER — ACETAMINOPHEN 650 MG RE SUPP: 650.0000 mg | Freq: Four times a day (QID) | RECTAL | Status: DC | PRN

## 2015-04-20 MED ORDER — EPHEDRINE SULFATE 50 MG/ML IJ SOLN
INTRAMUSCULAR | Status: AC
  Filled 2015-04-20: qty 1

## 2015-04-20 MED ORDER — ONDANSETRON HCL 4 MG PO TABS: 4.0000 mg | ORAL_TABLET | Freq: Four times a day (QID) | ORAL | Status: DC | PRN

## 2015-04-20 MED ORDER — SODIUM CHLORIDE 0.9 % IJ SOLN
INTRAMUSCULAR | Status: AC
  Filled 2015-04-20: qty 10

## 2015-04-20 MED ORDER — ONDANSETRON HCL 4 MG/2ML IJ SOLN
INTRAMUSCULAR | Status: AC
  Filled 2015-04-20: qty 2

## 2015-04-20 MED ORDER — FENTANYL CITRATE (PF) 100 MCG/2ML IJ SOLN
INTRAMUSCULAR | Status: AC
  Administered 2015-04-20: 25 ug via INTRAVENOUS
  Filled 2015-04-20: qty 2

## 2015-04-20 MED ORDER — SUCCINYLCHOLINE CHLORIDE 20 MG/ML IJ SOLN
INTRAMUSCULAR | Status: DC | PRN
Start: 2015-04-20 — End: 2015-04-20
  Administered 2015-04-20: 40 mg via INTRAVENOUS

## 2015-04-20 SURGICAL SUPPLY — 46 items
BLADE SAW RECIP 87.9 MT (BLADE) ×3 IMPLANT
BLADE SURG 21 STRL SS (BLADE) ×3 IMPLANT
BNDG COHESIVE 6X5 TAN STRL LF (GAUZE/BANDAGES/DRESSINGS) ×3 IMPLANT
BNDG GAUZE ELAST 4 BULKY (GAUZE/BANDAGES/DRESSINGS) ×3 IMPLANT
COVER SURGICAL LIGHT HANDLE (MISCELLANEOUS) ×6 IMPLANT
CUFF TOURNIQUET SINGLE 24IN (TOURNIQUET CUFF) ×3 IMPLANT
CUFF TOURNIQUET SINGLE 34IN LL (TOURNIQUET CUFF) IMPLANT
CUFF TOURNIQUET SINGLE 44IN (TOURNIQUET CUFF) IMPLANT
DRAPE EXTREMITY T 121X128X90 (DRAPE) ×3 IMPLANT
DRAPE PROXIMA HALF (DRAPES) ×9 IMPLANT
DRAPE U-SHAPE 47X51 STRL (DRAPES) ×3 IMPLANT
DRSG ADAPTIC 3X8 NADH LF (GAUZE/BANDAGES/DRESSINGS) ×3 IMPLANT
DRSG PAD ABDOMINAL 8X10 ST (GAUZE/BANDAGES/DRESSINGS) ×3 IMPLANT
DURAPREP 26ML APPLICATOR (WOUND CARE) ×3 IMPLANT
ELECT REM PT RETURN 9FT ADLT (ELECTROSURGICAL) ×3
ELECTRODE REM PT RTRN 9FT ADLT (ELECTROSURGICAL) ×1 IMPLANT
GAUZE SPONGE 4X4 12PLY STRL (GAUZE/BANDAGES/DRESSINGS) ×3 IMPLANT
GLOVE BIOGEL PI IND STRL 6.5 (GLOVE) ×1 IMPLANT
GLOVE BIOGEL PI IND STRL 7.5 (GLOVE) ×1 IMPLANT
GLOVE BIOGEL PI IND STRL 9 (GLOVE) ×1 IMPLANT
GLOVE BIOGEL PI INDICATOR 6.5 (GLOVE) ×2
GLOVE BIOGEL PI INDICATOR 7.5 (GLOVE) ×2
GLOVE BIOGEL PI INDICATOR 9 (GLOVE) ×2
GLOVE ECLIPSE 6.5 STRL STRAW (GLOVE) ×3 IMPLANT
GLOVE SURG ORTHO 9.0 STRL STRW (GLOVE) ×3 IMPLANT
GLOVE SURG SS PI 6.5 STRL IVOR (GLOVE) ×3 IMPLANT
GLOVE SURG SS PI 7.0 STRL IVOR (GLOVE) ×3 IMPLANT
GOWN STRL REUS W/ TWL XL LVL3 (GOWN DISPOSABLE) ×2 IMPLANT
GOWN STRL REUS W/TWL XL LVL3 (GOWN DISPOSABLE) ×4
KIT BASIN OR (CUSTOM PROCEDURE TRAY) ×3 IMPLANT
KIT ROOM TURNOVER OR (KITS) ×3 IMPLANT
MANIFOLD NEPTUNE II (INSTRUMENTS) ×3 IMPLANT
NS IRRIG 1000ML POUR BTL (IV SOLUTION) ×3 IMPLANT
PACK GENERAL/GYN (CUSTOM PROCEDURE TRAY) ×3 IMPLANT
PAD ABD 8X10 STRL (GAUZE/BANDAGES/DRESSINGS) ×3 IMPLANT
PAD ARMBOARD 7.5X6 YLW CONV (MISCELLANEOUS) ×6 IMPLANT
SPONGE GAUZE 4X4 12PLY STER LF (GAUZE/BANDAGES/DRESSINGS) ×3 IMPLANT
SPONGE LAP 18X18 X RAY DECT (DISPOSABLE) IMPLANT
STAPLER VISISTAT 35W (STAPLE) IMPLANT
STOCKINETTE IMPERVIOUS LG (DRAPES) ×3 IMPLANT
SUT SILK 2 0 (SUTURE) ×2
SUT SILK 2-0 18XBRD TIE 12 (SUTURE) ×1 IMPLANT
SUT VIC AB 1 CTX 27 (SUTURE) IMPLANT
TOWEL OR 17X24 6PK STRL BLUE (TOWEL DISPOSABLE) ×3 IMPLANT
TOWEL OR 17X26 10 PK STRL BLUE (TOWEL DISPOSABLE) ×3 IMPLANT
WATER STERILE IRR 1000ML POUR (IV SOLUTION) ×3 IMPLANT

## 2015-04-20 NOTE — Progress Notes (Signed)
Report called off to short stay RN Bridgette; CHG bath completed; MRSA pcr sent and pending results; pt transported off unit via bed with daughter at side to OR for procedure. Dionne BucyP. Amo Aivah Putman RN

## 2015-04-20 NOTE — Anesthesia Preprocedure Evaluation (Addendum)
Anesthesia Evaluation  Patient identified by MRN, date of birth, ID band Patient awake    Reviewed: Allergy & Precautions, NPO status , Patient's Chart, lab work & pertinent test results  Airway Mallampati: II  TM Distance: >3 FB Neck ROM: Full    Dental no notable dental hx. (+) Edentulous Upper, Edentulous Lower   Pulmonary pneumonia, former smoker,    Pulmonary exam normal breath sounds clear to auscultation       Cardiovascular hypertension, Pt. on medications + CAD and + Peripheral Vascular Disease  Normal cardiovascular exam Rhythm:Regular Rate:Normal  Moderate pulm HTN. EF 55-60%   Neuro/Psych negative neurological ROS     GI/Hepatic Neg liver ROS, GERD  ,  Endo/Other  negative endocrine ROS  Renal/GU negative Renal ROS     Musculoskeletal  (+) Arthritis ,   Abdominal   Peds  Hematology  (+) anemia ,   Anesthesia Other Findings   Reproductive/Obstetrics                          Anesthesia Physical Anesthesia Plan  ASA: III  Anesthesia Plan: General   Post-op Pain Management:    Induction: Intravenous  Airway Management Planned: LMA and Oral ETT  Additional Equipment:   Intra-op Plan:   Post-operative Plan: Extubation in OR  Informed Consent: I have reviewed the patients History and Physical, chart, labs and discussed the procedure including the risks, benefits and alternatives for the proposed anesthesia with the patient or authorized representative who has indicated his/her understanding and acceptance.   Dental advisory given  Plan Discussed with: CRNA and Surgeon  Anesthesia Plan Comments:        Anesthesia Quick Evaluation

## 2015-04-20 NOTE — Progress Notes (Signed)
Clarified with Dr. Seth BakeElgergaway, Md said it's ok for pt to take her meds with applesauce this am. Dionne BucyP. Amo Paymon Rosensteel RN

## 2015-04-20 NOTE — Op Note (Signed)
   Date of Surgery: 04/20/2015  INDICATIONS: Shannon Rice is a 81 y.o.-year-old female who has severe peripheral vascular disease. Patient has failed revascularization efforts she has dry gangrene involving the entire foot and presents at this time for transtibial amputation.Marland Kitchen.  PREOPERATIVE DIAGNOSIS: Gangrene left foot  POSTOPERATIVE DIAGNOSIS: Same.  PROCEDURE: Transtibial amputation on the left  SURGEON: Lajoyce Cornersuda, M.D.  ANESTHESIA:  general  IV FLUIDS AND URINE: See anesthesia.  ESTIMATED BLOOD LOSS: Minimal mL.  COMPLICATIONS: None.  DESCRIPTION OF PROCEDURE: The patient was brought to the operating room and underwent a general anesthetic. After adequate levels of anesthesia were obtained patient's lower extremity was prepped using DuraPrep draped into a sterile field. A timeout was called.  A transverse incision was made 11 cm distal to the tibial tubercle. This curved proximally and a large posterior flap was created. The tibia was transected 1 cm proximal to the skin incision. The fibula was transected just proximal to the tibial incision. The tibia was beveled anteriorly. A large posterior flap was created. The sciatic nerve was pulled cut and allowed to retract. The vascular bundles were suture ligated with 2-0 silk. The deep and superficial fascial layers were closed using #1 Vicryl. The skin was closed using staples and 2-0 nylon. The wound was covered with Adaptic orthopedic sponges AB dressing Kerlix and Coban. Patient was extubated taken to the PACU in stable condition.  Shannon BakerMarcus Duda, MD [facility] 2:53 PM

## 2015-04-20 NOTE — Progress Notes (Signed)
Pt c/o pain which was not controlled with prn meds and MD notified; new orders received and pt placed on telemetry which was verified with CCMD; continuous pulse ox applied. Pt states pain much better; and laying comfortably in bed with family at beside and call light within reach. Reported off to oncoming RN. Dionne BucyP. Amo Rocko Fesperman RN

## 2015-04-20 NOTE — Anesthesia Postprocedure Evaluation (Signed)
  Anesthesia Post-op Note  Patient: Shannon SkeetersJohnetta M Bojarski  Procedure(s) Performed: Procedure(s) (LRB): Left Below Knee Amputation (Left)  Patient Location: PACU  Anesthesia Type: General  Level of Consciousness: awake and alert   Airway and Oxygen Therapy: Patient Spontanous Breathing  Post-op Pain: mild  Post-op Assessment: Post-op Vital signs reviewed, Patient's Cardiovascular Status Stable, Respiratory Function Stable, Patent Airway and No signs of Nausea or vomiting  Last Vitals:  Filed Vitals:   04/20/15 1500  BP:   Pulse:   Temp: 36.8 C  Resp:     Post-op Vital Signs: stable   Complications: No apparent anesthesia complications

## 2015-04-20 NOTE — Anesthesia Procedure Notes (Signed)
Procedure Name: Intubation Date/Time: 04/20/2015 2:21 PM Performed by: Edmonia CaprioAUSTON, Shannon Rice Pre-anesthesia Checklist: Patient identified, Emergency Drugs available, Suction available, Timeout performed and Patient being monitored Patient Re-evaluated:Patient Re-evaluated prior to inductionOxygen Delivery Method: Circle system utilized Preoxygenation: Pre-oxygenation with 100% oxygen Intubation Type: IV induction Ventilation: Mask ventilation without difficulty Laryngoscope Size: Miller and 2 Grade View: Grade I Tube type: Oral Laser Tube: Cuffed inflated with minimal occlusive pressure - saline Tube size: 7.0 mm Number of attempts: 1 Airway Equipment and Method: Stylet Placement Confirmation: ETT inserted through vocal cords under direct vision,  positive ETCO2 and breath sounds checked- equal and bilateral Secured at: 20 cm Tube secured with: Tape Dental Injury: Teeth and Oropharynx as per pre-operative assessment

## 2015-04-20 NOTE — Progress Notes (Signed)
Pt came back from PACU at 1620; VSS, clean dry compression wrap dsg to Left BKA. No active bleeding or stain noted in dsg. Pt c/o pain and prn morphine administered; pt in bed with family at bedside and will closely monitor. Dionne BucyP. Amo Jamisen Hawes RN

## 2015-04-20 NOTE — Progress Notes (Signed)
Pt c/o sever pain in left foot of "more than 10".  Dr Sampson GoonFitzgerald assessed and order for fentanyl given.  Started with 25 mcg with little relief.  25 more mcg given.  Will report of to later nurses to assess further.

## 2015-04-20 NOTE — Care Management Important Message (Signed)
Important Message  Patient Details  Name: Shannon Rice MRN: [id number] Date of Birth: August 14, 1930   Medicare Important Message Given:  Yes-third notification given    Orson AloeMegan P Roselie Cirigliano 04/20/2015, 1:41 PM

## 2015-04-20 NOTE — Interval H&P Note (Signed)
History and Physical Interval Note:  04/20/2015 6:31 AM  Shannon HooverJohnetta Murriel HopperM Rice  has presented today for surgery, with the diagnosis of Gangrene Left Foot  The various methods of treatment have been discussed with the patient and family. After consideration of risks, benefits and other options for treatment, the patient has consented to  Procedure(s): Left Below Knee Amputation (Left) as a surgical intervention .  The patient's history has been reviewed, patient examined, no change in status, stable for surgery.  I have reviewed the patient's chart and labs.  Questions were answered to the patient's satisfaction.     Hanh Kertesz V

## 2015-04-20 NOTE — H&P (View-Only) (Signed)
Patient ID: Shannon SkeetersJohnetta M Liptak, female   DOB: 1930/07/12, 81 y.o.   MRN: [id number] Plan for left transtibial amputation tomorrow on Wednesday.

## 2015-04-20 NOTE — Progress Notes (Addendum)
PROGRESS NOTE  Shannon SkeetersJohnetta M Rice RUE:[id number] DOB: 1930-08-13 DOA: 04/12/2015 PCP: Ralene OkMOREIRA,ROY, MD  81 year old female with a history of scleroderma, peripheral vascular disease, dyslipidemia, status post angiogram on 10/4 by Dr.GANJI, JAY. Anticipated to be discharged from [facility] but discharge canceled because of medical complications. Hospitalist consult requested to take over because of low-grade fever of 100.2, hypoxic respiratory failure with oxygen saturation in the 70s on room air.  Patient has ulcer on left toe that has possible osteo-- seen by ortho   Assessment/Plan: Postprocedural fever. . -x ray shows possible osteo--- called ortho  - Blood cultures obtained on 04/13/2015 showed no growth to date - Urinalysis negative - CXR performed on 04/15/2015 showing multifocal airspace opacities in the right lung which may represent areas of alveolar edema versus infection. -diuresed about 7.4L - Continue vancomycin and Zosyn for HCAP and gangrenous left great toe/possible osteomyelitis. Repeat CXR in 4-6 weeks  Hypoxia.  -Improved patient titrated off supplemental oxygen as her V/Q scan negative-- duplex negative for DVT - -diuresed about 7.4L  HCAP - Continue with IV vancomycin and Zosyn, will discontinue antibiotics postoperatively.  HTN, stable Continue home anti-hypertensives  Hypokalemia.  -repleted   Herpes zoster keratoconjunctivitis.  -On valtrex   Critical lower limb ischemia,  - patient is s/p revascularization of / thrombectomy of the left peroneal artery. On Pletal at home, Brilinta just added. For transtibial amputation today  Weight loss/ severe protein calorie malnutrition.  - Nutritionist seeing now. She has lost approx 15 pounds over last several months  Constipation-  - improved  Code Status: full Family Communication: none at bedside.  Disposition Plan: home with daughter? SNF? PT will have to reevaluate post  amputation   Consultants:  Cards  ortho  Procedures:  See above  HPI/Subjective: Pain slightly improved. Some cough, some dyspnea  Objective: Filed Vitals:   04/20/15 1006  BP: 138/41  Pulse: 70  Temp:   Resp: 18    Intake/Output Summary (Last 24 hours) at 04/20/15 1154 Last data filed at 04/20/15 0015  Gross per 24 hour  Intake    340 ml  Output    500 ml  Net   -160 ml   Filed Weights   04/12/15 0932 04/13/15 0333 04/14/15 0337  Weight: 43.092 kg (95 lb) 43 kg (94 lb 12.8 oz) 42.8 kg (94 lb 5.7 oz)   tele: sinus 94   Exam:   General:  Awake and alert. In chair. Comfortable. forgetful  Cardiovascular: rrr  Respiratory: CTA without WRR  Abdomen: +BS, soft, NT, ND Musculoskeletal: gangrenous left great toe  Data Reviewed: Basic Metabolic Panel:  Recent Labs Lab 04/15/15 1220 04/16/15 0341 04/17/15 0327 04/18/15 0425 04/19/15 1947  NA 137 135 134* 134* 133*  K 3.8 3.1* 3.3* 3.9 4.2  CL 102 94* 92* 95* 94*  CO2 28 33* 29 30 32  GLUCOSE 116* 90 131* 106* 137*  BUN 8 <5* <5* 9 14  CREATININE 0.40* 0.37* 0.33* 0.48 0.67  CALCIUM 8.4* 8.1* 8.8* 8.7* 8.3*   Liver Function Tests:  Recent Labs Lab 04/13/15 1515 04/19/15 1947  AST 23 20  ALT 10* 11*  ALKPHOS 52 53  BILITOT 0.6 0.6  PROT 5.6* 6.1*  ALBUMIN 2.7* 2.6*    Recent Labs Lab 04/13/15 1515  LIPASE 13*   No results for input(s): AMMONIA in the last 168 hours. CBC:  Recent Labs Lab 04/14/15 0402 04/16/15 0341 04/17/15 0327 04/18/15 0425 04/19/15 1947  WBC 10.8* 9.4 9.4 8.3 6.5  NEUTROABS  --   --   --   --  4.7  HGB 9.0* 8.4* 10.2* 9.5* 8.8*  HCT 28.7* 26.5* 32.1* 30.4* 28.4*  MCV 94.4 94.3 94.4 93.0 94.4  PLT 150 180 213 244 220   Cardiac Enzymes: No results for input(s): CKTOTAL, CKMB, CKMBINDEX, TROPONINI in the last 168 hours. BNP (last 3 results) No results for input(s): BNP in the last 8760 hours.  ProBNP (last 3 results) No results for input(s): PROBNP in  the last 8760 hours.  CBG: No results for input(s): GLUCAP in the last 168 hours.  Recent Results (from the past 240 hour(s))  Culture, blood (routine x 2)     Status: None   Collection Time: 04/13/15  5:10 PM  Result Value Ref Range Status   Specimen Description BLOOD LEFT ANTECUBITAL  Final   Special Requests BOTTLES DRAWN AEROBIC ONLY 10CC  Final   Culture NO GROWTH 5 DAYS  Final   Report Status 04/18/2015 FINAL  Final  Culture, blood (routine x 2)     Status: None   Collection Time: 04/13/15  5:20 PM  Result Value Ref Range Status   Specimen Description BLOOD RIGHT HAND  Final   Special Requests BOTTLES DRAWN AEROBIC ONLY 5CC  Final   Culture NO GROWTH 5 DAYS  Final   Report Status 04/18/2015 FINAL  Final     Studies: No results found. Anti-infectives    Start     Dose/Rate Route Frequency Ordered Stop   04/19/15 0500  vancomycin (VANCOCIN) IVPB 750 mg/150 ml premix     750 mg 150 mL/hr over 60 Minutes Intravenous Every 12 hours 04/18/15 1717     04/18/15 1000  valACYclovir (VALTREX) tablet 1,000 mg     1,000 mg Oral Every 12 hours 04/18/15 0811     04/13/15 1630  piperacillin-tazobactam (ZOSYN) IVPB 3.375 g     3.375 g 12.5 mL/hr over 240 Minutes Intravenous Every 8 hours 04/13/15 1605     04/13/15 1630  vancomycin (VANCOCIN) IVPB 750 mg/150 ml premix  Status:  Discontinued     750 mg 150 mL/hr over 60 Minutes Intravenous Every 24 hours 04/13/15 1608 04/18/15 1717   04/12/15 1600  valACYclovir (VALTREX) tablet 1,000 mg  Status:  Discontinued     1,000 mg Oral 3 times daily 04/12/15 1513 04/18/15 0811      Scheduled Meds: . amLODipine  5 mg Oral Daily  . antiseptic oral rinse  7 mL Mouth Rinse BID  . aspirin EC  81 mg Oral Daily  . benazepril  10 mg Oral Daily  . [START ON 04/21/2015] cilostazol  50 mg Oral Daily  . collagenase  1 application Topical Q1200  . erythromycin  1 application Both Eyes QHS  . feeding supplement (ENSURE ENLIVE)  237 mL Oral BID BM  .  metoprolol tartrate  25 mg Oral BID  . piperacillin-tazobactam (ZOSYN)  IV  3.375 g Intravenous Q8H  . polyethylene glycol  17 g Oral Daily  . pravastatin  80 mg Oral q1800  . ticagrelor  90 mg Oral BID  . valACYclovir  1,000 mg Oral Q12H  . vancomycin  750 mg Intravenous Q12H   Continuous Infusions: . sodium chloride 75 mL/hr (04/19/15 1920)     Principal Problem:   Critical lower limb ischemia Active Problems:   Scleroderma (HCC)   HCAP (healthcare-associated pneumonia)   Herpes zoster keratoconjunctivitis   HTN (hypertension)   Constipation   Hypokalemia   Hypoxia  Osteomyelitis (HCC)   Protein-calorie malnutrition, severe (HCC)  Time spent: 30 min  Dhanvi Boesen  [facility] [phone] [email or web address], password [email or web address] 04/20/2015, 11:54 AM  LOS: 6 days

## 2015-04-20 NOTE — Transfer of Care (Signed)
Immediate Anesthesia Transfer of Care Note  Patient: Shannon SkeetersJohnetta M Virtue  Procedure(s) Performed: Procedure(s): Left Below Knee Amputation (Left)  Patient Location: PACU  Anesthesia Type:General  Level of Consciousness: awake  Airway & Oxygen Therapy: Patient Spontanous Breathing and Patient connected to nasal cannula oxygen  Post-op Assessment: Report given to RN, Post -op Vital signs reviewed and stable and Patient moving all extremities  Post vital signs: Reviewed and stable  Last Vitals:  Filed Vitals:   04/20/15 1006  BP: 138/41  Pulse: 70  Temp:   Resp: 18    Complications: No apparent anesthesia complications

## 2015-04-21 ENCOUNTER — Encounter (HOSPITAL_COMMUNITY): Payer: Self-pay | Admitting: Orthopedic Surgery

## 2015-04-21 LAB — BASIC METABOLIC PANEL
Anion gap: 6 (ref 5–15)
BUN: 6 mg/dL (ref 6–20)
CALCIUM: 8.2 mg/dL — AB (ref 8.9–10.3)
CO2: 30 mmol/L (ref 22–32)
CREATININE: 0.42 mg/dL — AB (ref 0.44–1.00)
Chloride: 97 mmol/L — ABNORMAL LOW (ref 101–111)
GFR calc Af Amer: >60 mL/min (ref >60)
GFR calc non Af Amer: >60 mL/min (ref >60)
GLUCOSE: 101 mg/dL — AB (ref 65–99)
Potassium: 3.9 mmol/L (ref 3.5–5.1)
Sodium: 133 mmol/L — ABNORMAL LOW (ref 135–145)

## 2015-04-21 LAB — CBC
HEMATOCRIT: 23.2 % — AB (ref 36.0–46.0)
HEMATOCRIT: 30.6 % — AB (ref 36.0–46.0)
Hemoglobin: 7.3 g/dL — ABNORMAL LOW (ref 12.0–15.0)
Hemoglobin: 9.5 g/dL — ABNORMAL LOW (ref 12.0–15.0)
MCH: 30.2 pg (ref 26.0–34.0)
MCH: 29.1 pg (ref 26.0–34.0)
MCHC: 31.5 g/dL (ref 30.0–36.0)
MCHC: 31 g/dL (ref 30.0–36.0)
MCV: 95.9 fL (ref 78.0–100.0)
MCV: 93.9 fL (ref 78.0–100.0)
Platelets: 187 10*3/uL (ref 150–400)
Platelets: 204 10*3/uL (ref 150–400)
RBC: 2.42 MIL/uL — ABNORMAL LOW (ref 3.87–5.11)
RBC: 3.26 MIL/uL — AB (ref 3.87–5.11)
RDW: 15.3 % (ref 11.5–15.5)
RDW: 15.3 % (ref 11.5–15.5)
WBC: 4.9 10*3/uL (ref 4.0–10.5)
WBC: 6.1 10*3/uL (ref 4.0–10.5)

## 2015-04-21 LAB — PREPARE RBC (CROSSMATCH)

## 2015-04-21 MED ORDER — SODIUM CHLORIDE 0.9 % IV SOLN
Freq: Once | INTRAVENOUS | Status: AC
  Administered 2015-04-21: 10:00:00 via INTRAVENOUS

## 2015-04-21 NOTE — Progress Notes (Signed)
Physical Therapy Treatment Patient Details Name: EZRI FANGUY MRN: [id number] DOB: [birth year] Today's Date: 04/21/2015    History of Present Illness Pt is an 81 y/o female with a PMH that includes HTN, lupus, PVD. Pt initially presented 10/4 for angiogram and was scheduled for d/c. This was held due to low grade fevers and concern for osteomyelitis of the L great toe. Pt s/p L transtibial amputation 10/12.    PT Comments    Progressing as expected.  Pt a little more anxious about mobility now than she is standing on one leg and nothing more to stand on.  Reluctant to put full weight in her arms as she was able to do on eval.  Follow Up Recommendations  SNF     Equipment Recommendations  Rolling walker with 5" wheels;3in1 (PT)    Recommendations for Other Services       Precautions / Restrictions Precautions Precautions: Fall    Mobility  Bed Mobility Overal bed mobility: Needs Assistance Bed Mobility: Supine to Sit     Supine to sit: Min assist     General bed mobility comments: once assisted up, pt scoots forward and back without assist  Transfers Overall transfer level: Needs assistance Equipment used: Rolling walker (2 wheeled) Transfers: Sit to/from [employer] Sit to Stand: Mod assist Stand pivot transfers: Mod assist       General transfer comment: vc/demo for hand placement, transfer technique.  stability assist while pt coming to full stand.  Ambulation/Gait             General Gait Details: unable today to manage "hopping" in place or "swing to".   Stairs            Wheelchair Mobility    Modified Rankin (Stroke Patients Only)       Balance Overall balance assessment: Needs assistance   Sitting balance-Leahy Scale: Good     Standing balance support: Bilateral upper extremity supported Standing balance-Leahy Scale: Poor Standing balance comment: reliant on the RW                    Cognition  Arousal/Alertness: Awake/alert Behavior During Therapy: WFL for tasks assessed/performed Overall Cognitive Status: Within Functional Limits for tasks assessed                      Exercises Amputee Exercises Quad Sets: AROM;Both;10 reps;Supine Hip Flexion/Marching: AROM;Both;10 reps;Supine (graded resistance) Knee Flexion: AAROM;Left;10 reps;Supine    General Comments General comments (skin integrity, edema, etc.): Discussed proper positioning.      Pertinent Vitals/Pain Pain Assessment: Faces Faces Pain Scale: Hurts even more Pain Location: L stump Pain Descriptors / Indicators: Grimacing;Sore Pain Intervention(s): Limited activity within patient's tolerance;Monitored during session    Home Living                      Prior Function            PT Goals (current goals can now be found in the care plan section) Acute Rehab PT Goals Patient Stated Goal: I want to get my leg, be Independent PT Goal Formulation: With patient/family Time For Goal Achievement: 04/30/15 Potential to Achieve Goals: Good Progress towards PT goals: Progressing toward goals    Frequency  Min 3X/week    PT Plan Discharge plan needs to be updated    Co-evaluation             End of Session  Activity Tolerance: Patient tolerated treatment well Patient left: in chair;with call bell/phone within reach;with family/visitor present     Time: [id number] PT Time Calculation (min) (ACUTE ONLY): 35 min  Charges:  $Therapeutic Activity: 8-22 mins                    G Codes:      Beverly Suriano, Eliseo GumKenneth V 04/21/2015, 12:48 PM  04/21/2015  [place] BingKen Arihanna Estabrook, PT [phone]  (pager)

## 2015-04-21 NOTE — Clinical Social Work Note (Signed)
Clinical Social Work Assessment  Patient Details  Name: Shannon SkeetersJohnetta M Glaeser MRN: [id number] Date of Birth: 1930/12/26  Date of referral:  04/21/15               Reason for consult:  Facility Placement                Permission sought to share information with:  Family Supports, Oceanographeracility Contact Representative Permission granted to share information::  Yes, Verbal Permission Granted  Name::     Para MarchVicky Moore  [place]  [facility] [facility]  Relationship::  daughter  SolicitorContact Information:     Housing/Transportation Living arrangements for the past 2 months:  Skilled Building surveyorursing Facility Source of Information:  Patient, Adult Children Patient Interpreter Needed:  None Criminal Activity/Legal Involvement Pertinent to Current Situation/Hospitalization:  No - Comment as needed Significant Relationships:  None Lives with:  Self Do you feel safe going back to the place where you live?  Yes Need for family participation in patient care:  No (Coment)  Care giving concerns:  Pt lives at home alone- has physical impairment following amputation   Office managerocial Worker assessment / plan:  CSW spoke with pt and pt dtr about PT recommendation for SNF  Employment status:  Retired Database administratornsurance information:  Managed Medicare PT Recommendations:  Skilled Nursing Facility Information / Referral to community resources:  Skilled Nursing Facility  Patient/Family's Response to care:  Pt agreeable to SNF- has not been before but heard of [facility] and would like to go there if possible  Patient/Family's Understanding of and Emotional Response to Diagnosis, Current Treatment, and Prognosis:  No questions or concerns at this time  Emotional Assessment Appearance:  Appears stated age Attitude/Demeanor/Rapport:    Affect (typically observed):  Appropriate, Pleasant Orientation:  Oriented to Self, Oriented to Place, Oriented to  Time, Oriented to Situation Alcohol / Substance use:  Not Applicable Psych involvement (Current  and /or in the community):  No (Comment)  Discharge Needs  Concerns to be addressed:  Care Coordination Readmission within the last 30 days:  No Current discharge risk:  Physical Impairment Barriers to Discharge:  Continued Medical Work up   Peabody EnergyHoloman, Demetri Goshert M, LCSW 04/21/2015, 2:57 PM

## 2015-04-21 NOTE — Clinical Social Work Placement (Signed)
   CLINICAL SOCIAL WORK PLACEMENT  NOTE  Date:  04/21/2015  Patient Details  Name: Shannon Rice MRN: [id number] Date of Birth: 01-04-1931  Clinical Social Work is seeking post-discharge placement for this patient at the Skilled  Nursing Facility level of care (*CSW will initial, date and re-position this form in  chart as items are completed):  Yes   Patient/family provided with [facility] Clinical Social Work Department's list of facilities offering this level of care within the geographic area requested by the patient (or if unable, by the patient's family).  Yes   Patient/family informed of their freedom to choose among providers that offer the needed level of care, that participate in Medicare, Medicaid or managed care program needed by the patient, have an available bed and are willing to accept the patient.  Yes   Patient/family informed of [facility]'s ownership interest in [facility] and [facility], as well as of the fact that they are under no obligation to receive care at these facilities.  PASRR submitted to EDS on 04/21/15     PASRR number received on 04/21/15     Existing PASRR number confirmed on       FL2 transmitted to all facilities in geographic area requested by pt/family on 04/21/15     FL2 transmitted to all facilities within larger geographic area on       Patient informed that his/her managed care company has contracts with or will negotiate with certain facilities, including the following:        Yes   Patient/family informed of bed offers received.  Patient chooses bed at       Physician recommends and patient chooses bed at      Patient to be transferred to   on  .  Patient to be transferred to facility by       Patient family notified on   of transfer.  Name of family member notified:        PHYSICIAN Please sign FL2     Additional Comment:    _______________________________________________ Izora RibasHoloman, Shannon Elza M,  LCSW 04/21/2015, 2:58 PM

## 2015-04-21 NOTE — Progress Notes (Addendum)
Pt and pt daughter given bed choice- chooses [facility]- pt daughter will visit facilities this evening to confirm choice  CSW initiated [employer] Medicare- auth pending  CSW will continue to follow   Merlyn LotJenna Holoman, Sleepy Eye Medical CenterCSWA Clinical Social Worker [phone]

## 2015-04-21 NOTE — Clinical Documentation Improvement (Signed)
Hospitalist  Please update your documentation within the medical record to reflect your response to this query. Thank you  Can the noted abnormal lab values be further specified?  Possible Conditions:  Iron deficiency Anemia  Acute Blood Loss Anemia  Nutritional anemia, including the nutrition or mineral deficits  Chronic Anemia, including the suspected or known cause  Anemia of chronic disease, including the associated chronic disease state  Other  Clinically Undetermined  Document any associated diagnoses/conditions.  Supporting Information: Results for Shannon SkeetersHENDERSON, [name] (MRN [id number]) as of 04/21/2015 09:33   04/18/2015 04:25 04/19/2015 19:47 04/21/2015 05:30  Hemoglobin  9.5 (L) 8.8 (L) 7.3 (L)  HCT  30.4 (L) 28.4 (L) 23.2 (L)   Please exercise your independent, professional judgment when responding. A specific answer is not anticipated or expected.  Thank You,  Toribio Harbourphelia R Jayveon Convey, RN, BSN, CCDS Certified Clinical Documentation Specialist [place]: Health Information Management [phone]

## 2015-04-21 NOTE — Care Management Note (Signed)
Case Management Note CM note started by Raynald BlendSamantha Angelica Frandsen RNCM  Patient Details  Name: Shannon Rice MRN: [id number] Date of Birth: 09-02-1930  Subjective/Objective:              Admitted with PAD Rice/p angiogram 10/4, history of scleroderma, peripheral vascular disease, dyslipidemia. Lives alone, has supportive daughter.   Action/Plan: Return to home when medically stable. CM to f/u with d/c needs.  Expected Discharge Date:                  Expected Discharge Plan:  Skilled Nursing Facility  In-House Referral:     Discharge planning Services  CM Consult  Post Acute Care Choice:    Choice offered to:     DME Arranged:    DME Agency:     HH Arranged:    HH Agency:     Status of Service:  In process, will continue to follow  Medicare Important Message Given:  Yes-third notification given Date Medicare IM Given:    Medicare IM give by:    Date Additional Medicare IM Given:    Additional Medicare Important Message give by:     If discussed at Long Length of Stay Meetings, dates discussed:  04/19/15  Additional Comments: 04/21/2015  Pt is Postoperative day 1 left transtibial amputation. PT will re-evaluate for disposition plan today  04/18/15- Donn PieriniKristi Webster RN, BSN - pt continues on IV abx, plan for OR on wed. 04/20/15 for transtibial amputation. NCM to continue to follow for d/c needs- pt may need STSNF vs home with HH post op    CM submitted beneifts check.  CM assessed pt, pt is independent without HH or DME  from home per daughter and pt.  Daughter is at bedside. Benefit check yielded: Pt copay will be $24- prior auth is not required. Pt already takes this med- this will be her last purchase from retail before she has to start mail order- her mail order amount will be $20. She is only allowed to purchase this med 2x from retail.  Pt stated her preferred pharmacy was [facility] on [address]  CM contacted pharmacy and was informed that a partial fill could be made  immediately and next day delivery would be available for remainder.  CM asked pharmacist if pt has previously filled drug;  per pharmacy [facility] submitted script on 10/5 and is ready for pick up (this is the 1st purchase mentioned above) .  Prior to this pt has never filled drug.  CM explained to pt and daughter that [facility] immediately post discharge, after that they would have to use mail order.  Pts daughter stated she understood.   Para MarchVicky Rice (Daughter)  [phone]  Shannon Rice, Shannon Broady S, RN,BSN,CM [phone] 04/21/2015, 11:42 AM

## 2015-04-21 NOTE — Progress Notes (Signed)
PROGRESS NOTE  Shannon Rice:[id number] DOB: 10-Aug-1930 DOA: 04/12/2015 PCP: Ralene Ok, MD  81 year old female with a history of scleroderma, peripheral vascular disease, dyslipidemia, status post angiogram on 10/4 by Dr.GANJI, JAY. Anticipated to be discharged from [facility] but discharge canceled because of medical complications. Hospitalist consult requested to take over because of low-grade fever of 100.2, hypoxic respiratory failure with oxygen saturation in the 70s on room air.  Patient has ulcer on left toe that has possible osteo-- seen by ortho, underwent left BKA on 10/12 by Dr. Lajoyce Corners.   Assessment/Plan:   Critical lower limb ischemia,  - patient is s/p revascularization of / thrombectomy of the left peroneal artery. On Pletal at home, Brilinta was added.  - Possible myelitis - Had left lower extremity transtibial amputation by Dr. Lajoyce Corners on 10/12.  Postprocedural fever. . -x ray shows possible osteo--- called ortho  - Blood cultures obtained on 04/13/2015 showed no growth to date - Urinalysis negative - CXR performed on 04/15/2015 showing multifocal airspace opacities in the right lung which may represent areas of alveolar edema versus infection. - Treated with vancomycin and Zosyn for HCAP and gangrenous left great toe/possible osteomyelitis. Repeat CXR in 4-6 weeks, antibiotics stopped 10/13.  Hypoxia.  -Improved patient titrated off supplemental oxygen as her V/Q scan negative-- duplex negative for DVT  Anemia of chronic illness - Hemoglobin drop after surgery, most likely secondary to blood loss, well transfuse 1 unit PRBC today.  HCAP - Treated with IV vancomycin and Zosyn.  HTN, -  stable Continue home anti-hypertensives  Hypokalemia.  -repleted   Herpes zoster keratoconjunctivitis.  -On valtrex  Weight loss/ severe protein calorie malnutrition.  - Nutritionist seeing now. She has lost approx 15 pounds over last several months  Constipation-  -  improved  Code Status: full Family Communication: none at bedside.  Disposition Plan: SNF in a.m.   Consultants:  Cards  ortho  Procedures:  Left transtibial amputation 10/12  1 unit PRBC 10/13  HPI/Subjective: Complaints of pain, but reports it's well-controlled when necessary pain medications, reports he feels much better today.  Objective: Filed Vitals:   04/21/15 1305  BP: 149/45  Pulse: 63  Temp: 99.3 F (37.4 C)  Resp: 18    Intake/Output Summary (Last 24 hours) at 04/21/15 1333 Last data filed at 04/21/15 1305  Gross per 24 hour  Intake   1372 ml  Output    477 ml  Net    895 ml   Filed Weights   04/13/15 0333 04/14/15 0337 04/21/15 0436  Weight: 43 kg (94 lb 12.8 oz) 42.8 kg (94 lb 5.7 oz) 43.5 kg (95 lb 14.4 oz)   tele: sinus 94   Exam:   General:  Awake and alert. In chair. Comfortable. forgetful  Cardiovascular: rrr  Respiratory: CTA without WRR  Abdomen: +BS, soft, NT, ND Musculoskeletal: Left BKA, no blood noticed through bandage.  Data Reviewed: Basic Metabolic Panel:  Recent Labs Lab 04/16/15 0341 04/17/15 0327 04/18/15 0425 04/19/15 1947 04/21/15 0530  NA 135 134* 134* 133* 133*  K 3.1* 3.3* 3.9 4.2 3.9  CL 94* 92* 95* 94* 97*  CO2 33* 29 30 32 30  GLUCOSE 90 131* 106* 137* 101*  BUN <5* <5* CREATININE 0.37* 0.33* 0.48 0.67 0.42*  CALCIUM 8.1* 8.8* 8.7* 8.3* 8.2*   Liver Function Tests:  Recent Labs Lab 04/19/15 1947  AST 20  ALT 11*  ALKPHOS 53  BILITOT 0.6  PROT 6.1*  ALBUMIN 2.6*   No results for input(s): LIPASE, AMYLASE in the last 168 hours. No results for input(s): AMMONIA in the last 168 hours. CBC:  Recent Labs Lab 04/16/15 0341 04/17/15 0327 04/18/15 0425 04/19/15 1947 04/21/15 0530  WBC 9.4 9.4 8.3 6.5 4.9  NEUTROABS  --   --   --  4.7  --   HGB 8.4* 10.2* 9.5* 8.8* 7.3*  HCT 26.5* 32.1* 30.4* 28.4* 23.2*  MCV 94.3 94.4 93.0 94.4 95.9  PLT 180 213 244 220 187   Cardiac  Enzymes: No results for input(s): CKTOTAL, CKMB, CKMBINDEX, TROPONINI in the last 168 hours. BNP (last 3 results) No results for input(s): BNP in the last 8760 hours.  ProBNP (last 3 results) No results for input(s): PROBNP in the last 8760 hours.  CBG: No results for input(s): GLUCAP in the last 168 hours.  Recent Results (from the past 240 hour(s))  Culture, blood (routine x 2)     Status: None   Collection Time: 04/13/15  5:10 PM  Result Value Ref Range Status   Specimen Description BLOOD LEFT ANTECUBITAL  Final   Special Requests BOTTLES DRAWN AEROBIC ONLY 10CC  Final   Culture NO GROWTH 5 DAYS  Final   Report Status 04/18/2015 FINAL  Final  Culture, blood (routine x 2)     Status: None   Collection Time: 04/13/15  5:20 PM  Result Value Ref Range Status   Specimen Description BLOOD RIGHT HAND  Final   Special Requests BOTTLES DRAWN AEROBIC ONLY 5CC  Final   Culture NO GROWTH 5 DAYS  Final   Report Status 04/18/2015 FINAL  Final  MRSA PCR Screening     Status: None   Collection Time: 04/20/15  1:06 PM  Result Value Ref Range Status   MRSA by PCR NEGATIVE NEGATIVE Final    Comment:        The GeneXpert MRSA Assay (FDA approved for NASAL specimens only), is one component of a comprehensive MRSA colonization surveillance program. It is not intended to diagnose MRSA infection nor to guide or monitor treatment for MRSA infections.      Studies: No results found. Anti-infectives    Start     Dose/Rate Route Frequency Ordered Stop   04/19/15 0500  vancomycin (VANCOCIN) IVPB 750 mg/150 ml premix  Status:  Discontinued     750 mg 150 mL/hr over 60 Minutes Intravenous Every 12 hours 04/18/15 1717 04/21/15 0722   04/18/15 1000  valACYclovir (VALTREX) tablet 1,000 mg     1,000 mg Oral Every 12 hours 04/18/15 0811     04/13/15 1630  piperacillin-tazobactam (ZOSYN) IVPB 3.375 g  Status:  Discontinued     3.375 g 12.5 mL/hr over 240 Minutes Intravenous Every 8 hours 04/13/15  1605 04/21/15 0722   04/13/15 1630  vancomycin (VANCOCIN) IVPB 750 mg/150 ml premix  Status:  Discontinued     750 mg 150 mL/hr over 60 Minutes Intravenous Every 24 hours 04/13/15 1608 04/18/15 1717   04/12/15 1600  valACYclovir (VALTREX) tablet 1,000 mg  Status:  Discontinued     1,000 mg Oral 3 times daily 04/12/15 1513 04/18/15 0811      Scheduled Meds: . amLODipine  5 mg Oral Daily  . antiseptic oral rinse  7 mL Mouth Rinse BID  . aspirin EC  81 mg Oral Daily  . benazepril  10 mg Oral Daily  . cilostazol  50 mg Oral Daily  . erythromycin  1 application Both Eyes  QHS  . feeding supplement (ENSURE ENLIVE)  237 mL Oral BID BM  . metoprolol tartrate  25 mg Oral BID  . polyethylene glycol  17 g Oral Daily  . pravastatin  80 mg Oral q1800  . ticagrelor  90 mg Oral BID  . valACYclovir  1,000 mg Oral Q12H   Continuous Infusions: . sodium chloride 10 mL/hr at 04/21/15 1310  . lactated ringers 10 mL/hr at 04/20/15 1400     Principal Problem:   Critical lower limb ischemia Active Problems:   Scleroderma (HCC)   HCAP (healthcare-associated pneumonia)   Herpes zoster keratoconjunctivitis   HTN (hypertension)   Constipation   Hypokalemia   Hypoxia   Osteomyelitis (HCC)   Protein-calorie malnutrition, severe (HCC)  Time spent: 30 min  Sakari Alkhatib  [facility] [phone] [email or web address], password [facility] 04/21/2015, 1:33 PM  LOS: 7 days

## 2015-04-21 NOTE — Progress Notes (Signed)
UR Completed. Axell Trigueros, RN, BSN.  [phone] 

## 2015-04-21 NOTE — Progress Notes (Signed)
Patient ID: Shannon Rice, female   DOB: 03-20-1931, 81 y.o.   MRN: [id number] Postoperative day 1 left transtibial amputation. Patient states she feels better today than she did prior to surgery. Plan for discharge to skilled nursing facility.

## 2015-04-22 LAB — TYPE AND SCREEN
ABO/RH(D): AB POS
Antibody Screen: NEGATIVE
Unit division: 0

## 2015-04-22 LAB — HEMOGLOBIN AND HEMATOCRIT, BLOOD
HEMATOCRIT: 28.6 % — AB (ref 36.0–46.0)
HEMOGLOBIN: 9.2 g/dL — AB (ref 12.0–15.0)

## 2015-04-22 MED ORDER — CLOPIDOGREL BISULFATE 75 MG PO TABS: 75.0000 mg | ORAL_TABLET | Freq: Every day | ORAL | Status: DC

## 2015-04-22 MED ORDER — OXYCODONE HCL 5 MG PO TABS
5.0000 mg | ORAL_TABLET | ORAL | Status: DC | PRN
  Administered 2015-04-22: 5 mg via ORAL
  Filled 2015-04-22: qty 1

## 2015-04-22 MED ORDER — OXYCODONE-ACETAMINOPHEN 5-325 MG PO TABS
1.0000 | ORAL_TABLET | ORAL | Status: DC | PRN
  Administered 2015-04-22: 1 via ORAL
  Filled 2015-04-22: qty 1

## 2015-04-22 MED ORDER — POLYETHYLENE GLYCOL 3350 17 G PO PACK: 17.0000 g | PACK | Freq: Every day | ORAL | Status: DC | PRN

## 2015-04-22 MED ORDER — ENSURE ENLIVE PO LIQD: 237.0000 mL | Freq: Two times a day (BID) | ORAL | Status: DC

## 2015-04-22 MED ORDER — OXYCODONE HCL 5 MG PO TABS: 5.0000 mg | ORAL_TABLET | ORAL | Status: DC | PRN

## 2015-04-22 MED ORDER — METHOCARBAMOL 500 MG PO TABS: 500.0000 mg | ORAL_TABLET | Freq: Four times a day (QID) | ORAL | Status: DC | PRN

## 2015-04-22 MED ORDER — ACETAMINOPHEN 325 MG PO TABS: 650.0000 mg | ORAL_TABLET | Freq: Four times a day (QID) | ORAL | Status: AC | PRN

## 2015-04-22 MED ORDER — MORPHINE SULFATE ER 15 MG PO TBCR: 15.0000 mg | EXTENDED_RELEASE_TABLET | Freq: Two times a day (BID) | ORAL | Status: DC

## 2015-04-22 MED ORDER — MORPHINE SULFATE ER 15 MG PO TBCR
15.0000 mg | EXTENDED_RELEASE_TABLET | Freq: Two times a day (BID) | ORAL | Status: DC
  Administered 2015-04-22: 15 mg via ORAL
  Filled 2015-04-22: qty 1

## 2015-04-22 NOTE — Progress Notes (Signed)
NURSING PROGRESS NOTE  Shannon SkeetersJohnetta M Rice [id number] Discharge Data: 04/22/2015 4:58 PM Attending Provider: No att. providers found HQI:ONGEXBM,WUXPCP:MOREIRA,ROY, MD     Shannon SkeetersJohnetta M Rice to be D/C'd Skilled nursing facility per MD order. All IV's discontinued with no bleeding noted. All belongings sent with patient.  Last Vital Signs:  Blood pressure 142/45, pulse 83, temperature 98.6 F (37 C), temperature source Oral, resp. rate 17, height 5\' 2"  (1.575 m), weight 43.5 kg (95 lb 14.4 oz), SpO2 94 %.  Discharge Medication List   Medication List    STOP taking these medications        HYDROcodone-acetaminophen 5-325 MG tablet  Commonly known as:  NORCO     ibuprofen 200 MG tablet  Commonly known as:  ADVIL,MOTRIN     oxyCODONE-acetaminophen 5-325 MG tablet  Commonly known as:  PERCOCET/ROXICET     SANTYL ointment  Generic drug:  collagenase      TAKE these medications        acetaminophen 325 MG tablet  Commonly known as:  TYLENOL  Take 2 tablets (650 mg total) by mouth every 6 (six) hours as needed for mild pain (or Fever >/= 101).     amLODipine-benazepril 5-20 MG capsule  Commonly known as:  LOTREL  Take 1 capsule by mouth daily.     aspirin 81 MG EC tablet  Take 1 tablet (81 mg total) by mouth daily.     cilostazol 50 MG tablet  Commonly known as:  PLETAL  Take 50 mg by mouth daily.     clopidogrel 75 MG tablet  Commonly known as:  PLAVIX  Take 1 tablet (75 mg total) by mouth daily.     diphenhydrAMINE-zinc acetate cream  Commonly known as:  BENADRYL  Apply 1 application topically 3 (three) times daily as needed for itching.     erythromycin ophthalmic ointment  Place 1 application into both eyes at bedtime.     feeding supplement (ENSURE ENLIVE) Liqd  Take 237 mLs by mouth 2 (two) times daily between meals.     LIVALO 2 MG Tabs  Generic drug:  Pitavastatin Calcium  Take 2 mg by mouth 2 (two) times daily.     methocarbamol 500 MG tablet  Commonly known  as:  ROBAXIN  Take 1 tablet (500 mg total) by mouth every 6 (six) hours as needed for muscle spasms.     metoprolol tartrate 25 MG tablet  Commonly known as:  LOPRESSOR  Take 25 mg by mouth 2 (two) times daily.     morphine 15 MG 12 hr tablet  Commonly known as:  MS CONTIN  Take 1 tablet (15 mg total) by mouth every 12 (twelve) hours.     ondansetron 4 MG disintegrating tablet  Commonly known as:  ZOFRAN ODT  Take 1 tablet (4 mg total) by mouth every 8 (eight) hours as needed for nausea or vomiting.     oxyCODONE 5 MG immediate release tablet  Commonly known as:  ROXICODONE  Take 1-2 tablets (5-10 mg total) by mouth every 4 (four) hours as needed for severe pain.     polyethylene glycol packet  Commonly known as:  MIRALAX / GLYCOLAX  Take 17 g by mouth daily as needed for moderate constipation.     valACYclovir 1000 MG tablet  Commonly known as:  VALTREX  Take 1,000 mg by mouth 3 (three) times daily.         Roma KayserStephanie Loribeth Katich, RN

## 2015-04-22 NOTE — Progress Notes (Signed)
Physical Therapy Treatment Patient Details Name: ALYVIA DERK MRN: [id number] DOB: 05/21/1931 Today's Date: 04/22/2015    History of Present Illness Pt is an 81 y/o female with a PMH that includes HTN, lupus, PVD. Pt initially presented 10/4 for angiogram and was scheduled for d/c. This was held due to low grade fevers and concern for osteomyelitis of the L great toe. Pt s/p L transtibial amputation 10/12.    PT Comments    Progressing steadily.  Good participation with exercises, Emphasis on standing, accepting weight through her arms, advancing forward and back and transfers.   Follow Up Recommendations  SNF     Equipment Recommendations  Rolling walker with 5" wheels;3in1 (PT)    Recommendations for Other Services       Precautions / Restrictions Precautions Precautions: Fall Restrictions LLE Weight Bearing: Weight bearing as tolerated    Mobility  Bed Mobility Overal bed mobility: Needs Assistance Bed Mobility: Supine to Sit Rolling: Min guard   Supine to sit: Min assist     General bed mobility comments: up via R UE, then scoots to EOB  Transfers Overall transfer level: Needs assistance Equipment used: Rolling walker (2 wheeled) Transfers: Sit to/from [employer] Sit to Stand: Mod assist Stand pivot transfers: +2 safety/equipment;Mod assist       General transfer comment: cues for hand placement, assist to come forward and power up.  instruction in safest method to be able to lift weight off the floor.  Ambulation/Gait Ambulation/Gait assistance: Max assist;+2 physical assistance Ambulation Distance (Feet): 2 Feet (forward and back x2 with very little steps ) Assistive device: Rolling walker (2 wheeled) Gait Pattern/deviations: Step-to pattern Gait velocity: decraesed   General Gait Details: with significant assist managed to "hop" in place and 2 feet forward and back.   Stairs            Wheelchair Mobility     Modified Rankin (Stroke Patients Only)       Balance Overall balance assessment: Needs assistance   Sitting balance-Leahy Scale: Good     Standing balance support: Bilateral upper extremity supported Standing balance-Leahy Scale: Poor Standing balance comment: reliance on UE's                    Cognition Arousal/Alertness: Awake/alert Behavior During Therapy: WFL for tasks assessed/performed Overall Cognitive Status: Within Functional Limits for tasks assessed                      Exercises Amputee Exercises Hip Extension: AROM;Strengthening;Both;10 reps (graded resistance) Hip ABduction/ADduction: AROM;Strengthening;Both;10 reps;Supine Hip Flexion/Marching: AROM;Strengthening;10 reps;Supine (graded resistance)    General Comments        Pertinent Vitals/Pain Pain Assessment: 0-10 Pain Score: 10-Worst pain ever (at times) Pain Descriptors / Indicators: Aching;Grimacing;Guarding Pain Intervention(s): Monitored during session;Premedicated before session    Home Living                      Prior Function            PT Goals (current goals can now be found in the care plan section) Acute Rehab PT Goals Patient Stated Goal: I want to get my leg, be Independent PT Goal Formulation: With patient/family Time For Goal Achievement: 04/30/15 Potential to Achieve Goals: Good Progress towards PT goals: Progressing toward goals    Frequency  Min 3X/week    PT Plan Discharge plan needs to be updated    Co-evaluation  End of Session   Activity Tolerance: Patient tolerated treatment well Patient left: in bed;with call bell/phone within reach     Time: 1122-1151 PT Time Calculation (min) (ACUTE ONLY): 29 min  Charges:  $Therapeutic Exercise: 8-22 mins $Therapeutic Activity: 8-22 mins                    G Codes:      Martrice Apt, Eliseo GumKenneth V 04/22/2015, 1:39 PM  04/22/2015  Union BingKen Rechelle Niebla,  PT [phone]  (pager)

## 2015-04-22 NOTE — Clinical Social Work Placement (Signed)
   CLINICAL SOCIAL WORK PLACEMENT  NOTE  Date:  04/22/2015  Patient Details  Name: Shannon Rice MRN: [id number] Date of Birth: 04/22/1931  Clinical Social Work is seeking post-discharge placement for this patient at the Skilled  Nursing Facility level of care (*CSW will initial, date and re-position this form in  chart as items are completed):  Yes   Patient/family provided with [facility] Clinical Social Work Department's list of facilities offering this level of care within the geographic area requested by the patient (or if unable, by the patient's family).  Yes   Patient/family informed of their freedom to choose among providers that offer the needed level of care, that participate in Medicare, Medicaid or managed care program needed by the patient, have an available bed and are willing to accept the patient.  Yes   Patient/family informed of [facility]'s ownership interest in [facility] and [facility], as well as of the fact that they are under no obligation to receive care at these facilities.  PASRR submitted to EDS on 04/21/15     PASRR number received on 04/21/15     Existing PASRR number confirmed on       FL2 transmitted to all facilities in geographic area requested by pt/family on 04/21/15     FL2 transmitted to all facilities within larger geographic area on       Patient informed that his/her managed care company has contracts with or will negotiate with certain facilities, including the following:   [facility](Blue Medicare  -pre-auth required)     Yes   Patient/family informed of bed offers received.  Patient [facility] bed at [facility]     Physician recommends and patient chooses bed at      Patient to be transferred [facility] on 04/22/15.  Patient to be transferred to facility by Ambulance  Sharin Mons(PTAR)     Patient family notified on 04/22/15 of transfer.  Name of family member notified:  Daughter- Para MarchVicky Rice     PHYSICIAN Please sign FL2,  Please prepare priority discharge summary, including medications, Please prepare prescriptions     Additional Comment:  Ok per MD for d/c today to SNF.  Blue Medicare auth received  [id number] with rug category RVB.  Facility notified of authorization by [employer] staff.  DC packet completed and d/c summary sent to facility for review.  Nursing notified to call report. Patient and daughter are aware of d/c and very pleased with d/c arrangements.  Private room is available for patient per her request.  No further CSW needs identified.  CSW signing off.     _______________________________________________ Darylene Pricerowder, Vertis Scheib T, LCSW 04/22/2015,4:56 PM

## 2015-04-22 NOTE — Progress Notes (Signed)
Report given to Danelle BerryJasmine Jones, LPN at [facility].

## 2015-04-22 NOTE — Discharge Summary (Addendum)
Shannon Rice, is a 81 y.o. female  DOB May 21, 1931  MRN [id number].  Admission date:  04/12/2015  Admitting Physician  Richarda OverlieNayana Abrol, MD  Discharge Date:  04/22/2015   Primary MD  Ralene OkMOREIRA,ROY, MD  Recommendations for primary care physician for things to follow:  - Basic labs including CBC, BMP in 3 days. - Patient to follow with Dr. Lajoyce Cornersuda in 2 weeks, SNF to arrange appointment . - Follow with Dr. Kennith CenterGann G in 1-2 weeks SNF to arrange appointment. - Patient to be seen by SNF physician in 24-48 hours, for further evaluation and management of her right lower extremity chronic ischemic pain.   Admission Diagnosis  claudication Gangrene Left Foot   Discharge Diagnosis  claudication Gangrene Left Foot    Principal Problem:   Critical lower limb ischemia Active Problems:   Scleroderma (HCC)   HCAP (healthcare-associated pneumonia)   Herpes zoster keratoconjunctivitis   HTN (hypertension)   Constipation   Hypokalemia   Hypoxia   Osteomyelitis (HCC)   Protein-calorie malnutrition, severe (HCC)      Past Medical History  Diagnosis Date  . Hypertension   . GERD (gastroesophageal reflux disease)   . Lupus (HCC)     skin, scleraderma  . Scleroderma (HCC)   . Arthritis     scleroderma  . Peripheral vascular disease [facility](HCC)     Past Surgical History  Procedure Laterality Date  . Abdominal hysterectomy    . Coronary angioplasty      Dr Jenne CampusMcQueen  . Sympathectomy Right 01/27/2015    Procedure: RIGHT RADICAL ARTERY SYMPATHECTOMY, RIGHT ULNAR ARTERY SYMPATHECTOMY, DIGITAL SYMPATHECTOMY RIGHT INDEX, MIDDLE, RING SMALL;  Surgeon: Cindee SaltGary Kuzma, MD;  Location: [facility];  Service: Orthopedics;  Laterality: Right;  . Amputation Right 01/27/2015    Procedure: AMPUTATION DISTAL TIP RIGHT INDEX FINGER;  Surgeon: Cindee SaltGary Kuzma, MD;  Location: [facility];  Service: Orthopedics;   Laterality: Right;  . Peripheral vascular catheterization N/A 02/15/2015    Procedure: Lower Extremity Angiography;  Surgeon: Yates DecampJay Ganji, MD;  [facility];  Service: Cardiovascular;  Laterality: N/A;  . Peripheral vascular catheterization Left 04/12/2015    Procedure: Lower Extremity Angiography;  Surgeon: Yates DecampJay Ganji, MD;  [facility];  Service: Cardiovascular;  Laterality: Left;  . Peripheral vascular catheterization Left 04/12/2015    Procedure: Peripheral Vascular Atherectomy;  Surgeon: Yates DecampJay Ganji, MD;  [facility];  Service: Cardiovascular;  Laterality: Left;  sfa     . Amputation Left 04/20/2015    Procedure: Left Below Knee Amputation;  Surgeon: Nadara MustardMarcus Duda V, MD;  [facility];  Service: Orthopedics;  Laterality: Left;       History of present illness and  Hospital Course:     Kindly see H&P for history of present illness and admission details, please review complete Labs, Consult reports and Test reports for all details in brief  HPI  from the history and physical done on the day of admission on 04/10/2015.  History of Present Illness Shannon Rice; 04/08/2015 10:37 AM) The patient is a 81 year old female who presents for a follow-up for Peripheral vascular disease. Patient underwent peripheral arteriogram on 02/15/2015 revealing severely calcified severely diffusely diseased vessels, however there was still brisk blood flow to her foot, would recommend medical therapy. Extremely high risk for complications for percutaneous revascularization Her symptoms had initially begun to improve, however, we received a phone call from her podiatrist recently stating that wounds had become resistant to therapy and her pain was becoming severe. They requested she be reevaluated.   She has not had any chest pain, has chronic shortness of breath. She is tolerating all her medications well. She has remained abstinent from tobacco use. She  reports worsening symptoms of claudication and also has throbbing pain at rest.  Hospital Course    81 year old female with a history of scleroderma, peripheral vascular disease, dyslipidemia, status post angiogram on 10/4 by Dr.GANJI, JAY. Anticipated to be discharged from [facility] but discharge canceled because of medical complications. Hospitalist consult requested to take over because of low-grade fever of 100.2, hypoxic respiratory failure with oxygen saturation in the 70s on room air. Patient has ulcer on left toe that has possible osteo-- seen by ortho, underwent left BKA on 10/12 by Dr. Lajoyce Corners.  Critical lower limb ischemia,  - patient is s/p revascularization of / thrombectomy of the left peroneal artery, on 04/12/2015, patient febrile, evidence of osteomyelitis, required  left lower extremity transtibial amputation by Dr. Lajoyce Corners on 10/12. - On aspirin, Pletal, discussed with Dr Jacinto Halim, Brilinta changed to Plavix on discharge. -  right lower extremity with evidence of ischemia during lower extremity jugular free with right common femoral artery calcified 80% stenosis , scars with Dr. Jacinto Halim, plan is for medical management at this point, will need intervention when more stable , - FOR  right lower extremity ischemic pain, be started on oral MS Contin 15 mg twice a day , and to continue on when necessary oxycodone 5-10 mg every 4 hours , TO be further assessed by SNF physician and adjusted as needed . Postprocedural fever. . -x ray shows possible osteo--- called ortho  - Blood cultures obtained on 04/13/2015 showed no growth to date - Urinalysis negative - CXR performed on 04/15/2015 showing multifocal airspace opacities in the right lung which may represent areas of alveolar edema versus infection. - Treated with vancomycin and Zosyn for HCAP and gangrenous left great toe/possible osteomyelitis.  Hypoxia.  -Improved patient titrated off supplemental oxygen as her V/Q scan negative-- duplex  negative for DVT  Anemia of chronic illness - Hemoglobin dropped after surgery, most likely secondary to blood loss, well transfuse 1 unit PRBC today.  HCAP - Treated with IV vancomycin and Zosyn. no further antibiotics on discharge   HTN, - stable Continue home anti-hypertensives  Hypokalemia.  -repleted  Herpes zoster keratoconjunctivitis.  -On valtrex  Weight loss/ severe protein calorie malnutrition.  - Nutritionist seeing now. She has lost approx 15 pounds over last several months  Constipation-  - improved  Discharge Condition  stable  Follow UP  Follow-up Information    Follow up with Nadara Mustard, MD On 05/06/2015.   Specialty:  Orthopedic Surgery   Why:  pt app is on 05/04/15 @2 :45   Contact information:   [address] [place] [address] [phone]       Follow up with [name], MD. Call on 04/29/2015.   Specialty:  Cardiology   Why:  post hospitalization follow up/ pt app is on 04/28/2015 :30   Contact information:   [address] [place] [address] [phone]         Discharge Instructions  and  Discharge Medications         Discharge Instructions    Diet - low sodium heart healthy    Complete by:  As directed      Discharge instructions    Complete by:  As directed   Follow with Primary MD Ralene Ok, MD in 7 days   Get CBC, CMP, 2 view Chest X ray checked  by Primary MD next visit.    Activity: As tolerated with Full fall precautions use walker/cane & assistance as needed   Disposition Home **   Diet: Heart Healthy  , with feeding assistance and aspiration precautions.  For Heart failure patients - Check your Weight same time everyday, if you gain over 2 pounds, or you develop in leg swelling, experience more shortness of breath or chest pain, call your Primary MD immediately. Follow Cardiac Low Salt Diet and 1.5 lit/day fluid restriction.   On your next visit with your primary care  physician please Get Medicines reviewed and adjusted.   Please request your Prim.MD to go over all Hospital Tests and Procedure/Radiological results at the follow up, please get all Hospital records sent to your Prim MD by signing hospital release before you go home.   If you experience worsening of your admission symptoms, develop shortness of breath, life threatening emergency, suicidal or homicidal thoughts you must seek medical attention immediately by calling 911 or calling your MD immediately  if symptoms less severe.  You Must read complete instructions/literature along with all the possible adverse reactions/side effects for all the Medicines you take and that have been prescribed to you. Take any new Medicines after you have completely understood and accpet all the possible adverse reactions/side effects.   Do not drive, operating heavy machinery, perform activities at heights, swimming or participation in water activities or provide baby sitting services if your were admitted for syncope or siezures until you have seen by Primary MD or a Neurologist and advised to do so again.  Do not drive when taking Pain medications.    Do not take more than prescribed Pain, Sleep and Anxiety Medications  Special Instructions: If you have smoked or chewed Tobacco  in the last 2 yrs please stop smoking, stop any regular Alcohol  and or any Recreational drug use.  Wear Seat belts while driving.   Please note  You were cared for by a hospitalist during your hospital stay. If you have any questions about your discharge medications or the care you received while you were in the hospital after you are discharged, you can call the unit and asked to speak with the hospitalist on call if the hospitalist that took care of you is not available. Once you are discharged, your primary care physician will handle any further medical issues. Please note that NO REFILLS for any discharge medications will be  authorized once you are discharged, as it is imperative that you return to your primary care physician (or establish a relationship with a primary care physician if you do not have one) for your aftercare needs so that they can reassess your need for medications and monitor your lab values.     Discharge wound care:    Complete by:  As directed   dry dressing wound changes in 3  days, thereafter as needed.            Medication List    STOP taking these medications        HYDROcodone-acetaminophen 5-325 MG tablet  Commonly known as:  NORCO     ibuprofen 200 MG tablet  Commonly known as:  ADVIL,MOTRIN     oxyCODONE-acetaminophen 5-325 MG tablet  Commonly known as:  PERCOCET/ROXICET     SANTYL ointment  Generic drug:  collagenase      TAKE these medications        acetaminophen 325 MG tablet  Commonly known as:  TYLENOL  Take 2 tablets (650 mg total) by mouth every 6 (six) hours as needed for mild pain (or Fever >/= 101).     amLODipine-benazepril 5-20 MG capsule  Commonly known as:  LOTREL  Take 1 capsule by mouth daily.     aspirin 81 MG EC tablet  Take 1 tablet (81 mg total) by mouth daily.     cilostazol 50 MG tablet  Commonly known as:  PLETAL  Take 50 mg by mouth daily.     clopidogrel 75 MG tablet  Commonly known as:  PLAVIX  Take 1 tablet (75 mg total) by mouth daily.     diphenhydrAMINE-zinc acetate cream  Commonly known as:  BENADRYL  Apply 1 application topically 3 (three) times daily as needed for itching.     erythromycin ophthalmic ointment  Place 1 application into both eyes at bedtime.     feeding supplement (ENSURE ENLIVE) Liqd  Take 237 mLs by mouth 2 (two) times daily between meals.     LIVALO 2 MG Tabs  Generic drug:  Pitavastatin Calcium  Take 2 mg by mouth 2 (two) times daily.     methocarbamol 500 MG tablet  Commonly known as:  ROBAXIN  Take 1 tablet (500 mg total) by mouth every 6 (six) hours as needed for muscle spasms.      metoprolol tartrate 25 MG tablet  Commonly known as:  LOPRESSOR  Take 25 mg by mouth 2 (two) times daily.     morphine 15 MG 12 hr tablet  Commonly known as:  MS CONTIN  Take 1 tablet (15 mg total) by mouth every 12 (twelve) hours.     ondansetron 4 MG disintegrating tablet  Commonly known as:  ZOFRAN ODT  Take 1 tablet (4 mg total) by mouth every 8 (eight) hours as needed for nausea or vomiting.     oxyCODONE 5 MG immediate release tablet  Commonly known as:  ROXICODONE  Take 1-2 tablets (5-10 mg total) by mouth every 4 (four) hours as needed for severe pain.     polyethylene glycol packet  Commonly known as:  MIRALAX / GLYCOLAX  Take 17 g by mouth daily as needed for moderate constipation.     valACYclovir 1000 MG tablet  Commonly known as:  VALTREX  Take 1,000 mg by mouth 3 (three) times daily.          Diet and Activity recommendation: See Discharge Instructions above   Consults obtained -   cardiology   orthopedic  Major procedures and Radiology Reports - PLEASE review detailed and final reports for all details, in brief -   - 10/4 lower extremity angiography with left peripheral vascular atherectomy. - 10/12  left vestibular amputation  - 10/13  1 unit PRBC transfusion            Dg Chest 1 View  04/13/2015  CLINICAL DATA:  Hypoxia. History of hypertension and peripheral vascular disease. EXAM: CHEST 1 VIEW COMPARISON:  05/13/2014 FINDINGS: There is focal consolidation superimpose over the right hilum with more ill-defined consolidation extending into the central right upper lobe. Linear type opacity is noted in both lung bases. Cardiac silhouette is mildly enlarged. No mediastinal or left hilar masses or adenopathy. No pneumothorax.  Probable small pleural effusions. Skeletal structures or demineralized but grossly intact. IMPRESSION: 1. Right lung opacity with focal opacity superimposed over the right hilum and less well-defined airspace opacity extending into  the central right upper lobe. Pneumonia suspected. Central neoplastic disease is possible with postobstructive change. 2. There is additional opacity in both lung bases that is most likely atelectasis. Probable small pleural effusions. Electronically Signed   By: Amie Portland M.D.   On: 04/13/2015 17:04   Dg Chest 2 View  04/17/2015  CLINICAL DATA:  Hospital acquired pneumonia.  Follow-up. EXAM: CHEST  2 VIEW COMPARISON:  04/15/2015.  04/13/2015.  05/13/2014. FINDINGS: Chronic cardiomegaly and aortic atherosclerosis. Chronic pulmonary hyperinflation. Patchy infiltrates in the perihilar regions right more than left are less dense. Effusions and basilar atelectasis are improved. No worsening or new finding. IMPRESSION: Radiographic improvement with decreased density of perihilar infiltrates right more than left and reduction in size of effusions with basilar atelectasis. Electronically Signed   By: Paulina Fusi M.D.   On: 04/17/2015 08:52   Dg Chest 2 View  04/15/2015  CLINICAL DATA:  Shortness of breath EXAM: CHEST  2 VIEW COMPARISON:  04/13/2015 FINDINGS: Moderate cardiac enlargement. There is aortic atherosclerosis noted. Bilateral pleural effusions are identified and appear increased from previous exam. Mild diffuse interstitial edema. Multifocal airspace opacities are noted in the right upper lobe, right midlung and right base. IMPRESSION: 1. CHF. 2. Multifocal airspace opacities in the right lung which may represent areas of alveolar edema or infection. Electronically Signed   By: Signa Kell M.D.   On: 04/15/2015 14:17   Dg Abd 1 View  04/13/2015  CLINICAL DATA:  Periumbilical region pain EXAM: ABDOMEN - 1 VIEW COMPARISON:  CT abdomen and pelvis August 24, 2010 FINDINGS: There is diffuse stool in the colon. There is no bowel dilatation or air-fluid level suggesting obstruction. No free air. There is widespread arterial vascular calcification. There is consolidation in the left lung base. There is  extensive arthropathy in the hip joints bilaterally. IMPRESSION: Diffuse stool in colon. Bowel gas pattern unremarkable. Extensive arterial vascular calcification. Electronically Signed   By: Bretta Bang III M.D.   On: 04/13/2015 17:03   Nm Pulmonary Perf And Vent  04/15/2015  CLINICAL DATA:  Elevated D-dimer, shortness of Breath EXAM: NUCLEAR MEDICINE VENTILATION - PERFUSION LUNG SCAN TECHNIQUE: Ventilation images were obtained in multiple projections using inhaled aerosol Tc-67m DTPA. Perfusion images were obtained in multiple projections after intravenous injection of Tc-34m MAA. RADIOPHARMACEUTICALS:  41.5 mCi of Technetium-51m DTPA aerosol inhalation and 5.8 mCi of Technetium-84m MAA IV COMPARISON:  Chest x-ray performed today. FINDINGS: Ventilation: Patchy moderate bilateral ventilation defects, much of which corresponds to airspace disease seen on chest x-ray Perfusion: No wedge shaped peripheral perfusion defects to suggest acute pulmonary embolism. Patchy nonsegmental perfusion defects bilaterally. IMPRESSION: Patchy ventilation and perfusion defects, much worse on the ventilation portion of the study. Findings low probability for pulmonary embolus. Electronically Signed   By: Charlett Nose M.D.   On: 04/15/2015 14:15   Dg Foot 2 Views Left  04/13/2015  CLINICAL DATA:  Left great toe pain with necrosis. Evaluate  for osteomyelitis. Initial encounter. EXAM: LEFT FOOT - 2 VIEW COMPARISON:  None. FINDINGS: The bones are demineralized. There is soft tissue irregularity and swelling in the great toe. There is possible cortical erosion of the distal first phalanx. The proximal phalanx and first metatarsal appear normal. There is no evidence of acute fracture or dislocation. There are mild degenerative changes within the midfoot and hindfoot. IMPRESSION: Possible osteomyelitis of the distal phalanx of the great toe with associated soft tissue irregularity. Electronically Signed   By: Carey Bullocks M.D.    On: 04/13/2015 17:09    Micro Results     Recent Results (from the past 240 hour(s))  Culture, blood (routine x 2)     Status: None   Collection Time: 04/13/15  5:10 PM  Result Value Ref Range Status   Specimen Description BLOOD LEFT ANTECUBITAL  Final   Special Requests BOTTLES DRAWN AEROBIC ONLY 10CC  Final   Culture NO GROWTH 5 DAYS  Final   Report Status 04/18/2015 FINAL  Final  Culture, blood (routine x 2)     Status: None   Collection Time: 04/13/15  5:20 PM  Result Value Ref Range Status   Specimen Description BLOOD RIGHT HAND  Final   Special Requests BOTTLES DRAWN AEROBIC ONLY 5CC  Final   Culture NO GROWTH 5 DAYS  Final   Report Status 04/18/2015 FINAL  Final  MRSA PCR Screening     Status: None   Collection Time: 04/20/15  1:06 PM  Result Value Ref Range Status   MRSA by PCR NEGATIVE NEGATIVE Final    Comment:        The GeneXpert MRSA Assay (FDA approved for NASAL specimens only), is one component of a comprehensive MRSA colonization surveillance program. It is not intended to diagnose MRSA infection nor to guide or monitor treatment for MRSA infections.        Today   Subjective:   Shannon Rice today has no headache,no chest abdominal pain,no new weakness tingling or numbness,complaining of pain in right lower extremity .  Objective:   Blood pressure 142/45, pulse 83, temperature 98.6 F (37 C), temperature source Oral, resp. rate 17, height 5\' 2"  (1.575 m), weight 43.5 kg (95 lb 14.4 oz), SpO2 94 %.   Intake/Output Summary (Last 24 hours) at 04/22/15 1528 Last data filed at 04/22/15 1300  Gross per 24 hour  Intake 938.16 ml  Output    601 ml  Net 337.16 ml    Exam  General: Awake and alert. In chair. Comfortable. forgetful  Cardiovascular: rrr  Respiratory: CTA without WRR  Abdomen: +BS, soft, NT, ND         Musculoskeletal: Left BKA, no blood noticed through dressing  Data Review   CBC w Diff:  Lab Results  Component  Value Date   WBC 6.1 04/21/2015   HGB 9.2* 04/22/2015   HCT 28.6* 04/22/2015   PLT 204 04/21/2015   LYMPHOPCT 14 04/19/2015   MONOPCT 9 04/19/2015   EOSPCT 3 04/19/2015   BASOPCT 0 04/19/2015    CMP:  Lab Results  Component Value Date   NA 133* 04/21/2015   K 3.9 04/21/2015   CL 97* 04/21/2015   CO2 30 04/21/2015   BUN 6 04/21/2015   CREATININE 0.42* 04/21/2015   PROT 6.1* 04/19/2015   ALBUMIN 2.6* 04/19/2015   BILITOT 0.6 04/19/2015   ALKPHOS 53 04/19/2015   AST 20 04/19/2015   ALT 11* 04/19/2015  .   Total Time  in preparing paper work, data evaluation and todays exam - 35 minutes  ELGERGAWY, DAWOOD M.D on 04/22/2015 at 3:28 PM  [facility]   Office  [phone]

## 2015-04-22 NOTE — Care Management Note (Signed)
Case Management Note CM note started by Raynald BlendSamantha Karista Aispuro RNCM  Patient Details  Name: Shannon Rice MRN: [id number] Date of Birth: 08/27/1930  Subjective/Objective:              Admitted with PAD s/p angiogram 10/4, history of scleroderma, peripheral vascular disease, dyslipidemia. Lives alone, has supportive daughter.   Action/Plan: Return to home when medically stable. CM to f/u with d/c needs.  Expected Discharge Date:                  Expected Discharge Plan:  Skilled Nursing Facility  In-House Referral:     Discharge planning Services  CM Consult  Post Acute Care Choice:    Choice offered to:     DME Arranged:    DME Agency:     HH Arranged:    HH Agency:     Status of Service:  In process, will continue to follow  Medicare Important Message Given:  Yes-third notification given Date Medicare IM Given:    Medicare IM give by:    Date Additional Medicare IM Given:    Additional Medicare Important Message give by:     If discussed at Long Length of Stay Meetings, dates discussed:  04/19/15  Additional Comments: 04/22/2015  Pt will discharge to SNF with aide of CSW  04/21/15 Pt is Postoperative day 1 left transtibial amputation. PT will re-evaluate for disposition plan today  04/18/15- Donn PieriniKristi Webster RN, BSN - pt continues on IV abx, plan for OR on wed. 04/20/15 for transtibial amputation. NCM to continue to follow for d/c needs- pt may need STSNF vs home with HH post op    CM submitted beneifts check.  CM assessed pt, pt is independent without HH or DME  from home per daughter and pt.  Daughter is at bedside. Benefit check yielded: Pt copay will be $24- prior auth is not required. Pt already takes this med- this will be her last purchase from retail before she has to start mail order- her mail order amount will be $20. She is only allowed to purchase this med 2x from retail.  Pt stated her preferred pharmacy was [facility] on [address]  CM contacted  pharmacy and was informed that a partial fill could be made immediately and next day delivery would be available for remainder.  CM asked pharmacist if pt has previously filled drug;  per pharmacy [facility] submitted script on 10/5 and is ready for pick up (this is the 1st purchase mentioned above) .  Prior to this pt has never filled drug.  CM explained to pt and daughter that [facility] immediately post discharge, after that they would have to use mail order.  Pts daughter stated she understood.   Para MarchVicky Moore (Daughter)  [phone]  Launa GrillClaxton, [employer] S, RN,BSN,CM [phone] 04/22/2015, 4:47 PM

## 2015-04-22 NOTE — Progress Notes (Signed)
Patient ID: Shannon Rice, female   DOB: [birth year], 81 y.o.   MRN: [id number] Patient resting comfortably this morning. [facility]. I will follow-up in the office in 2 weeks.

## 2015-04-22 NOTE — Progress Notes (Signed)
Pt still experiencing constant surgical and phantom pain ranging between 8-10/10. No relief from current PRN meds. Will pass omn to oncoming shift nurse.  Sandrea HammondJunris Lakenzie Mcclafferty RN

## 2015-04-26 ENCOUNTER — Non-Acute Institutional Stay (SKILLED_NURSING_FACILITY): Payer: Medicare Other | Admitting: Internal Medicine

## 2015-04-26 ENCOUNTER — Encounter: Payer: Self-pay | Admitting: Internal Medicine

## 2015-04-26 DIAGNOSIS — I70229 Atherosclerosis of native arteries of extremities with rest pain, unspecified extremity: Secondary | ICD-10-CM

## 2015-04-26 DIAGNOSIS — B0233 Zoster keratitis: Secondary | ICD-10-CM | POA: Diagnosis not present

## 2015-04-26 DIAGNOSIS — J189 Pneumonia, unspecified organism: Secondary | ICD-10-CM

## 2015-04-26 DIAGNOSIS — K5909 Other constipation: Secondary | ICD-10-CM | POA: Diagnosis not present

## 2015-04-26 DIAGNOSIS — R2681 Unsteadiness on feet: Secondary | ICD-10-CM | POA: Diagnosis not present

## 2015-04-26 DIAGNOSIS — R531 Weakness: Secondary | ICD-10-CM

## 2015-04-26 DIAGNOSIS — I739 Peripheral vascular disease, unspecified: Secondary | ICD-10-CM | POA: Diagnosis not present

## 2015-04-26 DIAGNOSIS — I998 Other disorder of circulatory system: Secondary | ICD-10-CM

## 2015-04-26 DIAGNOSIS — D62 Acute posthemorrhagic anemia: Secondary | ICD-10-CM

## 2015-04-26 DIAGNOSIS — E43 Unspecified severe protein-calorie malnutrition: Secondary | ICD-10-CM

## 2015-04-26 DIAGNOSIS — I10 Essential (primary) hypertension: Secondary | ICD-10-CM

## 2015-04-26 NOTE — Progress Notes (Signed)
Patient ID: Shannon Rice, female   DOB: 11-Feb-1931, 81 y.o.   MRN: [id number]     [facility]  PCP: Ralene Ok, MD  Code Status: DNR  No Known Allergies  Chief Complaint  Patient presents with  . New Admit To SNF    New admission      HPI:  81 y.o. patient is here for short term rehabilitation post hospital admission from 04/12/15-04/22/15 with left foot gangrene with claudication and osteomyelitis. She had critical left lower limb ischemia. She underwent lower extremity angiography with revascularization and thrombectomy of the left peroneal artery on 04/12/2015. With her osteomyelitis she underwent left lower extremity transtibial amputation by Dr. Lajoyce Corners on 04/20/15. She also has right lower limb ischemia with calcification and stenosis and is currently on medical management. She is seen in her room with her daughter present. She had orthopedic appointment this am and as per daughter incision has been healing well per ortho team. No visit note available for review. She has PMH of scleroderma, HTN, PVD, HLD among others. Her pain is under control with current regimen. She has vascular appointment this Thursday. Denies any concerns.   Review of Systems:  Constitutional: Negative for fever, chills, diaphoresis.  HENT: Negative for headache, congestion, nasal discharge Eyes: Negative for eye pain, blurred vision, double vision and discharge.  Respiratory: Negative for cough, shortness of breath and wheezing.   Cardiovascular: Negative for chest pain, palpitations  Gastrointestinal: Negative for heartburn, nausea, vomiting, abdominal pain. miralax has been helpful Genitourinary: Negative for dysuria, urgency, frequency, hematuria, incontinence and flank pain.  Musculoskeletal: Negative for back pain, falls Skin: Negative for itching, rash.  Neurological: Negative for dizziness, tingling, focal weakness Psychiatric/Behavioral: Negative for depression    Past  Medical History  Diagnosis Date  . Hypertension   . GERD (gastroesophageal reflux disease)   . Lupus (HCC)     skin, scleraderma  . Scleroderma (HCC)   . Arthritis     scleroderma  . Peripheral vascular disease [facility])    Past Surgical History  Procedure Laterality Date  . Abdominal hysterectomy    . Coronary angioplasty      Dr [name]  . Sympathectomy Right 01/27/2015    Procedure: RIGHT RADICAL ARTERY SYMPATHECTOMY, RIGHT ULNAR ARTERY SYMPATHECTOMY, DIGITAL SYMPATHECTOMY RIGHT INDEX, MIDDLE, RING SMALL;  Surgeon: [name], MD;  Location: [facility];  Service: Orthopedics;  Laterality: Right;  . Amputation Right 01/27/2015    Procedure: AMPUTATION DISTAL TIP RIGHT INDEX FINGER;  Surgeon: [name], MD;  Location: [facility];  Service: Orthopedics;  Laterality: Right;  . Peripheral vascular catheterization N/A 02/15/2015    Procedure: Lower Extremity Angiography;  Surgeon: [name], MD;  Location: [facility] INVASIVE CV LAB;  Service: Cardiovascular;  Laterality: N/A;  . Peripheral vascular catheterization Left 04/12/2015    Procedure: Lower Extremity Angiography;  Surgeon: [name], MD;  Location: [facility] INVASIVE CV LAB;  Service: Cardiovascular;  Laterality: Left;  . Peripheral vascular catheterization Left 04/12/2015    Procedure: Peripheral Vascular Atherectomy;  Surgeon: [name], MD;  Location: [facility] INVASIVE CV LAB;  Service: Cardiovascular;  Laterality: Left;  sfa     . Amputation Left 04/20/2015    Procedure: Left Below Knee Amputation;  Surgeon: Nadara Mustard, MD;  Location: [facility] OR;  Service: Orthopedics;  Laterality: Left;   Social History:   reports that she quit smoking about 4 months ago. Her smoking use included Cigarettes. She has a 15 pack-year smoking  history. She has never used smokeless tobacco. She reports that she drinks alcohol. She reports that she does not use illicit drugs.  Family History  Problem Relation Age of Onset  . Diabetes Sister   .  Diabetes Brother     Medications:   Medication List       This list is accurate as of: 04/26/15 12:00 PM.  Always use your most recent med list.               acetaminophen 325 MG tablet  Commonly known as:  TYLENOL  Take 2 tablets (650 mg total) by mouth every 6 (six) hours as needed for mild pain (or Fever >/= 101).     amLODipine-benazepril 5-20 MG capsule  Commonly known as:  LOTREL  Take 1 capsule by mouth daily.     aspirin 81 MG EC tablet  Take 1 tablet (81 mg total) by mouth daily.     cilostazol 50 MG tablet  Commonly known as:  PLETAL  Take 50 mg by mouth daily.     clopidogrel 75 MG tablet  Commonly known as:  PLAVIX  Take 1 tablet (75 mg total) by mouth daily.     diphenhydrAMINE-zinc acetate cream  Commonly known as:  BENADRYL  Apply 1 application topically 3 (three) times daily as needed for itching.     erythromycin ophthalmic ointment  Place 1 application into both eyes at bedtime.     LIVALO 2 MG Tabs  Generic drug:  Pitavastatin Calcium  Take 2 mg by mouth 2 (two) times daily.     methocarbamol 500 MG tablet  Commonly known as:  ROBAXIN  Take 1 tablet (500 mg total) by mouth every 6 (six) hours as needed for muscle spasms.     metoprolol tartrate 25 MG tablet  Commonly known as:  LOPRESSOR  Take 25 mg by mouth 2 (two) times daily.     morphine 15 MG 12 hr tablet  Commonly known as:  MS CONTIN  Take 1 tablet (15 mg total) by mouth every 12 (twelve) hours.     ondansetron 4 MG disintegrating tablet  Commonly known as:  ZOFRAN ODT  Take 1 tablet (4 mg total) by mouth every 8 (eight) hours as needed for nausea or vomiting.     oxyCODONE 5 MG immediate release tablet  Commonly known as:  ROXICODONE  Take 1-2 tablets (5-10 mg total) by mouth every 4 (four) hours as needed for severe pain.     polyethylene glycol packet  Commonly known as:  MIRALAX / GLYCOLAX  Take 17 g by mouth daily as needed for moderate constipation.     UNABLE TO  FIND  Med Name: Med Pass 120 cc by mouth twice daily between meals for nutrition supplement     valACYclovir 1000 MG tablet  Commonly known as:  VALTREX  Take 1,000 mg by mouth 3 (three) times daily.         Physical Exam: Filed Vitals:   04/26/15 1146  BP: 152/63  Pulse: 80  Temp: 98.7 F (37.1 C)  TempSrc: Oral  Resp: 20  SpO2: 96%    General- elderly female, thin built, in no acute distress Head- normocephalic, atraumatic Nose- normal nasal mucosa, no maxillary or frontal sinus tenderness, no nasal discharge Throat- moist mucus membrane Eyes- PERRLA, EOMI, no pallor, no icterus, no discharge, normal conjunctiva, normal sclera Neck- no cervical lymphadenopathy Cardiovascular- normal s1,s2, no murmurs, weak dorsalis pedis and good radial pulses, trace  right foot and leg edema Respiratory- bilateral clear to auscultation, no wheeze, no rhonchi, no crackles, no use of accessory muscles Abdomen- bowel sounds present, soft, non tender Musculoskeletal- left BKA, able to move all other extremities, right foot has erythema with mild swelling, right fourth toe has open wound with black discoloration, left BKA site has staples in place and incision healing well as per report from orthopedic office Neurological- no focal deficit, alert and oriented to person, place and time Skin- warm and dry, some erythema in right foot and some black discoloration in midfoot area Psychiatry- normal mood and affect    Labs reviewed: Basic Metabolic Panel:  Recent Labs  56/21/3009/04/23 0425 04/19/15 1947 04/21/15 0530  NA 134* 133* 133*  K 3.9 4.2 3.9  CL 95* 94* 97*  CO2 30 32 30  GLUCOSE 106* 137* 101*  BUN 9 14 6   CREATININE 0.48 0.67 0.42*  CALCIUM 8.7* 8.3* 8.2*   Liver Function Tests:  Recent Labs  04/13/15 1515 04/19/15 1947  AST 23 20  ALT 10* 11*  ALKPHOS 52 53  BILITOT 0.6 0.6  PROT 5.6* 6.1*  ALBUMIN 2.7* 2.6*    Recent Labs  04/13/15 1515  LIPASE 13*   No results  for input(s): AMMONIA in the last 8760 hours. CBC:  Recent Labs  12/08/14 0755  04/19/15 1947 04/21/15 0530 04/21/15 1452 04/22/15 0412  WBC 5.6  < > 6.5 4.9 6.1  --   NEUTROABS 3.5  --  4.7  --   --   --   HGB 12.7  < > 8.8* 7.3* 9.5* 9.2*  HCT 39.6  < > 28.4* 23.2* 30.6* 28.6*  MCV 94.3  < > 94.4 95.9 93.9  --   PLT 135*  < > 220 187 204  --   < > = values in this interval not displayed.  Radiological Exams: Dg Chest 1 View  04/13/2015  CLINICAL DATA:  Hypoxia. History of hypertension and peripheral vascular disease. EXAM: CHEST 1 VIEW COMPARISON:  05/13/2014 FINDINGS: There is focal consolidation superimpose over the right hilum with more ill-defined consolidation extending into the central right upper lobe. Linear type opacity is noted in both lung bases. Cardiac silhouette is mildly enlarged. No mediastinal or left hilar masses or adenopathy. No pneumothorax.  Probable small pleural effusions. Skeletal structures or demineralized but grossly intact. IMPRESSION: 1. Right lung opacity with focal opacity superimposed over the right hilum and less well-defined airspace opacity extending into the central right upper lobe. Pneumonia suspected. Central neoplastic disease is possible with postobstructive change. 2. There is additional opacity in both lung bases that is most likely atelectasis. Probable small pleural effusions. Electronically Signed   By: Amie Portlandavid  Ormond M.D.   On: 04/13/2015 17:04   Dg Abd 1 View  04/13/2015  CLINICAL DATA:  Periumbilical region pain EXAM: ABDOMEN - 1 VIEW COMPARISON:  CT abdomen and pelvis August 24, 2010 FINDINGS: There is diffuse stool in the colon. There is no bowel dilatation or air-fluid level suggesting obstruction. No free air. There is widespread arterial vascular calcification. There is consolidation in the left lung base. There is extensive arthropathy in the hip joints bilaterally. IMPRESSION: Diffuse stool in colon. Bowel gas pattern unremarkable.  Extensive arterial vascular calcification. Electronically Signed   By: Bretta BangWilliam  Woodruff III M.D.   On: 04/13/2015 17:03   Dg Foot 2 Views Left  04/13/2015  CLINICAL DATA:  Left great toe pain with necrosis. Evaluate for osteomyelitis. Initial encounter. EXAM: LEFT  FOOT - 2 VIEW COMPARISON:  None. FINDINGS: The bones are demineralized. There is soft tissue irregularity and swelling in the great toe. There is possible cortical erosion of the distal first phalanx. The proximal phalanx and first metatarsal appear normal. There is no evidence of acute fracture or dislocation. There are mild degenerative changes within the midfoot and hindfoot. IMPRESSION: Possible osteomyelitis of the distal phalanx of the great toe with associated soft tissue irregularity. Electronically Signed   By: Carey Bullocks M.D.   On: 04/13/2015 17:09     Assessment/Plan  Generalized weakness Will have patient work with PT/OT as tolerated to regain strength and restore function.  Fall precautions are in place.  Unsteady gait Post left BKA. Will need to work with therapy team for gait training and strengthening exercise. High fall risk. Fall precautions  Left foot gangrene S/p left bka for osteomyelitis. Will have patient work with PT/OT as tolerated to regain strength and restore function.  Fall precautions are in place. Seen by dr duda today, has f/u. Continue plavix for now with aspirin and pletal. Continue prn robaxin for muscle spasm sand lyrica for neuropathic pain  Right leg PAD Pending vascular appointment on 04/28/15. Has 80% calcified stenosis to right femoral. Medical management for now. Continue mscontin 15 mg bid with prn oxycodone 5 mg 1-2 tab q4h prn pain. Continue aspirin, pletal and plavix for now. Monitor cbc  Blood loss anemia S/p 1 u prbc transfusion in hospital. Monitor cbc given her being on aspirin and plavix  HCAP Completed antibiotic treatment, breathing stable, continue pulmonary  toileting  HTN Continue lotrel 5-20 mg daily and lopressor 25 mg bid  Protein calorie malnutrition Monitor po intake and weight for now  Constipation Continue miralax daily for now and hydration encouraged  Herpes zoster keratoconjunctivitis Continue valtrex tid for now   Goals of care: short term rehabilitation   Labs/tests ordered: cbc, bmp  Family/ staff Communication: reviewed care plan with patient, her daughter and nursing supervisor    Oneal Grout, MD  [facility] [phone] (Monday-Friday 8 am - 5 pm) [phone] (afterhours)

## 2015-04-27 ENCOUNTER — Non-Acute Institutional Stay (SKILLED_NURSING_FACILITY): Payer: Medicare Other | Admitting: Nurse Practitioner

## 2015-04-27 DIAGNOSIS — I998 Other disorder of circulatory system: Secondary | ICD-10-CM | POA: Diagnosis not present

## 2015-04-27 DIAGNOSIS — I70229 Atherosclerosis of native arteries of extremities with rest pain, unspecified extremity: Secondary | ICD-10-CM

## 2015-04-27 DIAGNOSIS — R52 Pain, unspecified: Secondary | ICD-10-CM | POA: Diagnosis not present

## 2015-04-27 DIAGNOSIS — G546 Phantom limb syndrome with pain: Secondary | ICD-10-CM

## 2015-04-27 MED ORDER — PREGABALIN 50 MG PO CAPS: 50.0000 mg | ORAL_CAPSULE | Freq: Three times a day (TID) | ORAL | Status: DC

## 2015-04-27 NOTE — Progress Notes (Signed)
Patient ID: Shannon Rice, female   DOB: 27-Aug-1930, 81 y.o.   MRN: [id number]    Nursing Home Location:  [facility]   Place of Service: SNF (31)  PCP: Ralene Ok, MD  No Known Allergies  Chief Complaint  Patient presents with  . Acute Visit    pain    HPI:  Patient is a 81 y.o. female seen today at [facility] for pain. Pt with recent revascularization of / thrombectomy of the left peroneal artery, on 04/12/2015 with  evidence of osteomyelitis, required left lower extremity transtibial amputation by Dr. Lajoyce Corners on 10/12. Pt also with  right lower extremity with evidence of ischemia during lower extremity jugular free with right common femoral artery calcified 80% stenosis and plan is for medical management at this point but need intervention when more stable. Pt current taking MsContin 15 mg twice a day  and oxycodone 5-10 mg every 4 hours right lower extremity ischemic pain. Pt reports pain in right lower extremity but also notes pain to left lower extremity that has been amputated. Reports numbness and tingling.  Reports current PRN does help alleviate right foot pain. No side effects noted from medication.  Review of Systems:  Review of Systems  Constitutional: Negative for fever, activity change, appetite change, fatigue and unexpected weight change.  HENT: Negative for congestion and hearing loss.   Eyes: Negative.   Respiratory: Negative for cough and shortness of breath.   Cardiovascular: Negative for chest pain, palpitations and leg swelling.  Gastrointestinal: Negative for abdominal pain, diarrhea and constipation.  Genitourinary: Negative for dysuria and difficulty urinating.  Musculoskeletal: Negative for myalgias and arthralgias.  Skin: Negative for color change and wound.  Neurological: Negative for dizziness and weakness.  Psychiatric/Behavioral: Positive for confusion (memory loss noted). Negative for behavioral problems and  agitation.    Past Medical History  Diagnosis Date  . Hypertension   . GERD (gastroesophageal reflux disease)   . Lupus (HCC)     skin, scleraderma  . Scleroderma (HCC)   . Arthritis     scleroderma  . Peripheral vascular disease [facility])    Past Surgical History  Procedure Laterality Date  . Abdominal hysterectomy    . Coronary angioplasty      Dr [name]  . Sympathectomy Right 01/27/2015    Procedure: RIGHT RADICAL ARTERY SYMPATHECTOMY, RIGHT ULNAR ARTERY SYMPATHECTOMY, DIGITAL SYMPATHECTOMY RIGHT INDEX, MIDDLE, RING SMALL;  Surgeon: [name], MD;  Location: [facility];  Service: Orthopedics;  Laterality: Right;  . Amputation Right 01/27/2015    Procedure: AMPUTATION DISTAL TIP RIGHT INDEX FINGER;  Surgeon: [name], MD;  Location:  SURGERY CENTER;  Service: Orthopedics;  Laterality: Right;  . Peripheral vascular catheterization N/A 02/15/2015    Procedure: Lower Extremity Angiography;  Surgeon: [name], MD;  Location: [facility] INVASIVE CV LAB;  Service: Cardiovascular;  Laterality: N/A;  . Peripheral vascular catheterization Left 04/12/2015    Procedure: Lower Extremity Angiography;  Surgeon: [name], MD;  Location: [facility] INVASIVE CV LAB;  Service: Cardiovascular;  Laterality: Left;  . Peripheral vascular catheterization Left 04/12/2015    Procedure: Peripheral Vascular Atherectomy;  Surgeon: [name], MD;  Location: [facility] INVASIVE CV LAB;  Service: Cardiovascular;  Laterality: Left;  sfa     . Amputation Left 04/20/2015    Procedure: Left Below Knee Amputation;  Surgeon: Nadara Mustard, MD;  Location: [facility] OR;  Service: Orthopedics;  Laterality: Left;   Social History:  reports that she quit smoking about 4 months ago. Her smoking use included Cigarettes. She has a 15 pack-year smoking history. She has never used smokeless tobacco. She reports that she drinks alcohol. She reports that she does not use illicit drugs.  Family History  Problem Relation Age of Onset  .  Diabetes Sister   . Diabetes Brother     Medications: Patient's Medications  New Prescriptions   No medications on file  Previous Medications   ACETAMINOPHEN (TYLENOL) 325 MG TABLET    Take 2 tablets (650 mg total) by mouth every 6 (six) hours as needed for mild pain (or Fever >/= 101).   AMLODIPINE-BENAZEPRIL (LOTREL) 5-20 MG PER CAPSULE    Take 1 capsule by mouth daily.   ASPIRIN EC 81 MG EC TABLET    Take 1 tablet (81 mg total) by mouth daily.   CILOSTAZOL (PLETAL) 50 MG TABLET    Take 50 mg by mouth daily.   CLOPIDOGREL (PLAVIX) 75 MG TABLET    Take 1 tablet (75 mg total) by mouth daily.   DIPHENHYDRAMINE-ZINC ACETATE (BENADRYL) CREAM    Apply 1 application topically 3 (three) times daily as needed for itching.   ERYTHROMYCIN OPHTHALMIC OINTMENT    Place 1 application into both eyes at bedtime.   METHOCARBAMOL (ROBAXIN) 500 MG TABLET    Take 1 tablet (500 mg total) by mouth every 6 (six) hours as needed for muscle spasms.   METOPROLOL TARTRATE (LOPRESSOR) 25 MG TABLET    Take 25 mg by mouth 2 (two) times daily.   MORPHINE (MS CONTIN) 15 MG 12 HR TABLET    Take 1 tablet (15 mg total) by mouth every 12 (twelve) hours.   ONDANSETRON (ZOFRAN ODT) 4 MG DISINTEGRATING TABLET    Take 1 tablet (4 mg total) by mouth every 8 (eight) hours as needed for nausea or vomiting.   OXYCODONE (ROXICODONE) 5 MG IMMEDIATE RELEASE TABLET    Take 1-2 tablets (5-10 mg total) by mouth every 4 (four) hours as needed for severe pain.   PITAVASTATIN CALCIUM (LIVALO) 2 MG TABS    Take 2 mg by mouth 2 (two) times daily.   POLYETHYLENE GLYCOL (MIRALAX / GLYCOLAX) PACKET    Take 17 g by mouth daily as needed for moderate constipation.   UNABLE TO FIND    Med Name: Med Pass 120 cc by mouth twice daily between meals for nutrition supplement   VALACYCLOVIR (VALTREX) 1000 MG TABLET    Take 1,000 mg by mouth 3 (three) times daily.  Modified Medications   No medications on file  Discontinued Medications   No medications  on file     Physical Exam: Filed Vitals:   04/27/15 1135  BP: 134/65  Pulse: 77  Temp: 99 F (37.2 C)  Resp: 20    Physical Exam  Constitutional: No distress.  Thin AA female NAD  HENT:  Head: Normocephalic and atraumatic.  Cardiovascular: Normal rate, regular rhythm and normal heart sounds.   Pulmonary/Chest: Effort normal and breath sounds normal.  Abdominal: Soft. Bowel sounds are normal. She exhibits no distension. There is no tenderness.  Musculoskeletal:  Left BKA Right foot in boot and wrapped, dsg CDI  Neurological: She is alert.  Skin: Skin is warm and dry. She is not diaphoretic.  Psychiatric: She has a normal mood and affect.    Labs reviewed: Basic Metabolic Panel:  Recent Labs  16/10/96 0425 04/19/15 1947 04/21/15 0530  NA 134* 133* 133*  K 3.9  4.2 3.9  CL 95* 94* 97*  CO2 30 32 30  GLUCOSE 106* 137* 101*  BUN 9 14 6   CREATININE 0.48 0.67 0.42*  CALCIUM 8.7* 8.3* 8.2*   Liver Function Tests:  Recent Labs  04/13/15 1515 04/19/15 1947  AST 23 20  ALT 10* 11*  ALKPHOS 52 53  BILITOT 0.6 0.6  PROT 5.6* 6.1*  ALBUMIN 2.7* 2.6*    Recent Labs  04/13/15 1515  LIPASE 13*   No results for input(s): AMMONIA in the last 8760 hours. CBC:  Recent Labs  12/08/14 0755  04/19/15 1947 04/21/15 0530 04/21/15 1452 04/22/15 0412  WBC 5.6  < > 6.5 4.9 6.1  --   NEUTROABS 3.5  --  4.7  --   --   --   HGB 12.7  < > 8.8* 7.3* 9.5* 9.2*  HCT 39.6  < > 28.4* 23.2* 30.6* 28.6*  MCV 94.3  < > 94.4 95.9 93.9  --   PLT 135*  < > 220 187 204  --   < > = values in this interval not displayed. TSH: No results for input(s): TSH in the last 8760 hours. A1C: No results found for: HGBA1C Lipid Panel: No results for input(s): CHOL, HDL, LDLCALC, TRIG, CHOLHDL, LDLDIRECT in the last 8760 hours.  Radiological Exams: Dg Chest 1 View  04/13/2015  CLINICAL DATA:  Hypoxia. History of hypertension and peripheral vascular disease. EXAM: CHEST 1 VIEW  COMPARISON:  05/13/2014 FINDINGS: There is focal consolidation superimpose over the right hilum with more ill-defined consolidation extending into the central right upper lobe. Linear type opacity is noted in both lung bases. Cardiac silhouette is mildly enlarged. No mediastinal or left hilar masses or adenopathy. No pneumothorax.  Probable small pleural effusions. Skeletal structures or demineralized but grossly intact. IMPRESSION: 1. Right lung opacity with focal opacity superimposed over the right hilum and less well-defined airspace opacity extending into the central right upper lobe. Pneumonia suspected. Central neoplastic disease is possible with postobstructive change. 2. There is additional opacity in both lung bases that is most likely atelectasis. Probable small pleural effusions. Electronically Signed   By: Amie Portlandavid  Ormond M.D.   On: 04/13/2015 17:04   Dg Abd 1 View  04/13/2015  CLINICAL DATA:  Periumbilical region pain EXAM: ABDOMEN - 1 VIEW COMPARISON:  CT abdomen and pelvis August 24, 2010 FINDINGS: There is diffuse stool in the colon. There is no bowel dilatation or air-fluid level suggesting obstruction. No free air. There is widespread arterial vascular calcification. There is consolidation in the left lung base. There is extensive arthropathy in the hip joints bilaterally. IMPRESSION: Diffuse stool in colon. Bowel gas pattern unremarkable. Extensive arterial vascular calcification. Electronically Signed   By: Bretta BangWilliam  Woodruff III M.D.   On: 04/13/2015 17:03   Dg Foot 2 Views Left  04/13/2015  CLINICAL DATA:  Left great toe pain with necrosis. Evaluate for osteomyelitis. Initial encounter. EXAM: LEFT FOOT - 2 VIEW COMPARISON:  None. FINDINGS: The bones are demineralized. There is soft tissue irregularity and swelling in the great toe. There is possible cortical erosion of the distal first phalanx. The proximal phalanx and first metatarsal appear normal. There is no evidence of acute fracture  or dislocation. There are mild degenerative changes within the midfoot and hindfoot. IMPRESSION: Possible osteomyelitis of the distal phalanx of the great toe with associated soft tissue irregularity. Electronically Signed   By: Carey BullocksWilliam  Veazey M.D.   On: 04/13/2015 17:09    Assessment/Plan 1.  Critical lower limb ischemia -conts on ASA, pletal and plavix, ongoing medical management for right lower leg ischemia. Pain controlled on current regimen.  2. Phantom pain (HCC) -will add lyricia 50 mg TID to help with  phantom limb pain.    Janene Harvey. Biagio Borg  [facility] [phone] 8 am - 5 pm) [phone] (after hours)

## 2015-04-29 ENCOUNTER — Other Ambulatory Visit: Payer: Self-pay | Admitting: *Deleted

## 2015-04-29 MED ORDER — MORPHINE SULFATE ER 15 MG PO TBCR: 15.0000 mg | EXTENDED_RELEASE_TABLET | Freq: Two times a day (BID) | ORAL | Status: DC

## 2015-04-29 NOTE — Telephone Encounter (Signed)
[facility] 

## 2015-05-05 ENCOUNTER — Other Ambulatory Visit: Payer: Self-pay

## 2015-05-05 MED ORDER — OXYCODONE HCL 5 MG PO TABS: 5.0000 mg | ORAL_TABLET | ORAL | Status: DC | PRN

## 2015-05-05 NOTE — Telephone Encounter (Signed)
Rx faxed to [facility] @ [phone], phone number [phone]  

## 2015-05-15 NOTE — H&P (Signed)
OFFICE VISIT NOTES COPIED TO EPIC FOR DOCUMENTATION  Shannon Rice 15-May-2015 8:27 AM Location: [facility] Patient #: [id number] DOB: February 17, 1931 Widowed / Language: [name] / Race: Black or African American Female   History of Present Illness Shannon Rice [name]; 15-May-2015 4:33 PM) The patient is a 81 year old female who presents for a follow-up for Peripheral vascular disease. Patient underwent peripheral arteriogram on 02/15/2015 revealing severely calcified severely diffusely diseased vessels, however there was still brisk blood flow to her foot, therefore medical therapy was recommended as she was extremely high risk for complications for percutaneous revascularization Her symptoms had initially begun to improve, however, wounds had become resistant to therapy and her pain was becoming severe. She underwent repeat PV angiogram with successful angioplasty to the left peroneal artery. Unfortunately, she developed nausea and fever the next day and was found to have already developed osteomyelitis of the left lower extremity. She subsequently underwent left BKA and was discharged to a rehab facility on 04/22/2015. She presents today for follow up.   She has not had any chest pain, has chronic shortness of breath. She is tolerating all her medications well. She has remained abstinent from tobacco use. She reports redness to top of right foot and ulceration of 4th digit of right foot.    Problem List/Past Medical Shannon Rice Rice; 2015/05/15 1:44 PM) Benign essential hypertension (I10) Labs 02/10/2015: Creatinine 0.49, potassium 4.0, hemoglobin 10.5/HCT 30.6 with normocytic indices, PLT 135, PT 11.4, INR 1.1 Abnormal EKG (R94.31) Lexiscan myoview stress test 01/07/2015: 1. The resting electrocardiogram demonstrated normal sinus rhythm, IVCD and no resting arrhythmias. Stress EKG is non-diagnostic for ischemia as it a pharmacologic stress using Lexiscan. Stress symptoms  included dyspnea, 2. Myocardial perfusion imaging is normal. Overall left ventricular systolic function was normal without regional wall motion abnormalities. The left ventricular ejection fraction was 64%. Bilateral carotid bruits (R09.89) Carotid artery duplex 09/16/2014: Mild stenosis of bilateral distal internal carotid artery, mid internal carotid artery and proximal internal carotid artery (<50% stenosis in the range of 16-49%). Bilateral vessels geometry is tortuous. Follow up in one year is appropriate if clinically indicated. Compared to the study done on 09/08/2013, no significant change in velocity. 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 Consider f/u in 5 years if clinically indicated as ectasia can progress. H/O scleroderma (Z87.39) Peripheral vascular disease, secondary (I73.9) Scleroderma associated small vessel disease and occlusion of the digit vessels by upper extremity angiogram 11/2014 Peripheral vascular disease of extremity (I73.9) Peripheral arteriogram 04/12/2015: Left SFA 1.55mm solid crown CSI atherectomy and 5 mm balloon PTA. 80% to 0%, Single vessel r/u (peroneal) s/p thrombectomy and balloon PTA with 2 mm Foxcross balloon. Has right SFA mid stenosis by angiogram 02/15/2015. Podiatry office visit 02/07/2015: Non-pressure ulceration left great toe, debrided. Lower extremity arterial duplex 01/19/2015: Moderate velocity increase at the right proximal superficial femoral and posterior tibial arteries of > 50%. Significant stenoses in the left external iliac artery of > 50%. This exam reveals moderately decreased perfusion of the right lower extremity with ABI 0.53 and critically decreased perfusion of the left lower extremity with ABI of 0.25, noted at the post tibial artery level. Consider further work-up. Hypercholesteremia  (E78.00)  Allergies (Shannon Rice; 05/15/2015 1:44 PM) No Known Drug Allergies06/15/2016  Family History Shannon Rice Rice; 2015-05-15 1:44 PM) Mother Deceased. at age 77, from Natural Causes. No heart conditions known  Father Deceased. at age 79, from apparant MI. No heart history known Sister 2 1 older, 1 younger Brother 1 younger, deceased  Social History (Shannon Rice; 04/28/2015 1:56 PM) Current tobacco use Former smoker. Quit December 08, 2014 Alcohol Use Occasional alcohol use. Marital status Widowed. Number of Children 2. Living Situation Lives in group home. [facility]  Past Surgical History Shannon Rice(Shannon Rice; 04/28/2015 1:57 PM) Hysterectomy; [phone] Surgery to relieve muscle and nerves to her index finger 01/27/2015 Right. Amputation10/06/2015 Left. left leg below the knee amputation  Medication History (Shannon Rice; 04/28/2015 2:10 PM) Cilostazol (50MG  Tablet, 1 (one) Tablet Tablet Oral two times daily, Taken starting 02/24/2015) Active. Aspirin (325MG  Tablet, 1 Oral daily) Active. Metoprolol Tartrate (25MG  Tablet, 1 Oral two times daily) Active. Amlodipine Besy-Benazepril HCl (5-20MG  Capsule, 1 Oral daily) Active. Livalo (2MG  Tablet, 1 Oral daily) Active. ValACYclovir HCl (1GM Tablet, 1 Oral three times daily) Active. (for ten days) Erythromycin (5MG /GM Ointment, apply to eye Ophthalmic two times daily) Active. Oxycodone-Acetaminophen (5-325MG /5ML Solution, 1 Oral every four hours as needed for pain) Active. MS Contin (15MG  Tablet ER, 1 Oral two times daily) Active. Tylenol (325MG  Tablet, 2 Oral every 6 hours as needed for pain) Active. Ondansetron (4MG  Tablet Disperse, 1 Oral as needed for nausea) Active. Clopidogrel Bisulfate (75MG  Tablet, 1 Oral daily) Active. MiraLax (17g Oral daily) Active. Medications Reconciled  Diagnostic Studies History Shannon Rice(Shannon Rice, [name]; 04/28/2015 4:35 PM) PV  Angiogram10/10/2014 Left SFA 1.305mm solid crown CSI atherectomy and 5 mm balloon PTA. 80% to 0%, Single vessel r/u (peroneal) s/p thrombectomy and balloon PTA with 2 mm Foxcross balloon. Has right SFA mid stenosis by angiogram 02/15/2015. Echocardiogram10/02/2015 Left ventricle: The cavity size was normal. There was moderate concentric hypertrophy. Systolic function was normal. The estimated ejection fraction was in the range of 60% to 65%. Features are consistent with a pseudonormal left ventricular filling pattern, with concomitant abnormal relaxation and increased filling pressure (grade 2 diastolic dysfunction). Doppler parameters are consistent with both elevated ventricular end-diastolic filling pressure and elevated left atrial fillingpressure. Aortic valve: There was trivial regurgitation. Mitral valve: Calcified annulus. There was mild regurgitation. Left atrium: The atrium was moderately dilated. Right atrium: The atrium was mildly dilated. Tricuspid valve: There was moderate regurgitation. Pulmonary arteries: PA peak pressure: 59 mm Hg (S). Pericardium, extracardiac: There was a left pleural effusion.    Review of Systems (Shannon Revonda Standardllison [name]; 04/28/2015 4:29 PM) General Not Present- Anorexia, Fatigue and Fever. Skin Present- Skin Color Changes and Ulcer (right 4th toe). Respiratory Not Present- Cough, Decreased Exercise Tolerance and Dyspnea. Cardiovascular Present- Claudications. Not Present- Chest Pain, Edema, Orthopnea, Paroxysmal Nocturnal Dyspnea and Shortness of Breath. Gastrointestinal Not Present- Change in Bowel Habits, Constipation and Nausea. Musculoskeletal Present- Physical Disability (left BKA). Neurological Not Present- Focal Neurological Symptoms. Endocrine Not Present- Appetite Changes, Cold Intolerance and Heat Intolerance. Hematology Not Present- Anemia, Petechiae and Prolonged Bleeding.  Vitals Shannon Rice(Shannon Rice; 04/28/2015 1:55 PM) 04/28/2015 1:52 PM Weight:  85 lb Height: 62in Weight was reported by patient. Body Surface Area: 1.33 m Body Mass Index: 15.55 kg/m  Pulse: 68 (Regular)  P.OX: 99% (Room air) BP: 134/50 (Sitting, Left Arm, Standard)       Physical Exam (Shannon Rice [name]; 04/28/2015 4:24 PM) General Mental Status-Alert. General Appearance-Cooperative, Appears stated age, Not in acute distress. Orientation-Oriented X3. Build & Nutrition-Lean and Petite.  Head and Neck Thyroid Gland Characteristics - no palpable nodules, no palpable enlargement.  Chest and Lung Exam Palpation Tender -  No chest wall tenderness. Auscultation Breath sounds - Clear.  Cardiovascular Inspection Jugular vein - Right - No Distention. Auscultation Heart Sounds - S1 WNL, S2 WNL and No gallop present. Murmurs & Other Heart Sounds - Murmur - No murmur.  Abdomen Palpation/Percussion Normal exam - Non Tender and No hepatosplenomegaly. Auscultation Normal exam - Bowel sounds normal.  Peripheral Vascular Lower Extremity Inspection - Left - Note: BKA. Inspection - Right - Delayed capillary refill, Digital ulcers(right 4th toe), Erythematous and Shiny atrophic skin. Palpation - Edema - Right - Trace edema. Femoral pulse - Left - 2+ and Bruit. Right - 1+ and Bruit. Popliteal pulse - Left - 0+. Right - 0+. Dorsalis pedis pulse - Right - 0+. Posterior tibial pulse - Right - 0+. Carotid arteries - Left-Soft bilateral carotid bruit.. Carotid arteries - Right-Soft bilateral carotid bruit.. Abdomen-Prominent abdominal aortic pulsation and Epigastric bruit present.  Neurologic Motor-Grossly intact without any focal deficits.  Musculoskeletal Global Assessment Left Lower Extremity - normal range of motion without pain. Right Lower Extremity - normal range of motion without pain.    Assessment & Plan (Shannon [name] [name]; 04/28/2015 4:29 PM) H/O scleroderma (Z87.39) Peripheral vascular disease, secondary  (I73.9) Story: Scleroderma associated small vessel disease and occlusion of the digit vessels by upper extremity angiogram 11/2014 Peripheral vascular disease of extremity (I73.9) Story: Peripheral arteriogram 04/12/2015: Left SFA 1.15mm solid crown CSI atherectomy and 5 mm balloon PTA. 80% to 0%, Single vessel r/u (peroneal) s/p thrombectomy and balloon PTA with 2 mm Foxcross balloon. Has right SFA mid stenosis by angiogram 02/15/2015.  Podiatry office visit 02/07/2015: Non-pressure ulceration left great toe, debrided.  Lower extremity arterial duplex 01/19/2015: Moderate velocity increase at the right proximal superficial femoral and posterior tibial arteries of > 50%. Significant stenoses in the left external iliac artery of > 50%. This exam reveals moderately decreased perfusion of the right lower extremity with ABI 0.53 and critically decreased perfusion of the left lower extremity with ABI of 0.25, noted at the post tibial artery level. Consider further work-up. Future Plans 05/10/2015: METABOLIC PANEL, BASIC [phone]) - one time 05/10/2015: CBC & PLATELETS (AUTO) (60454) - one time 05/10/2015: PT (PROTHROMBIN TIME) (09811) - one time Critical lower limb ischemia (I99.8) Complete below knee amputation of left lower extremity, sequela (B14.782N) Story: 04/20/2015 Dr Lajoyce Corners Current Plans Mechanism of underlying disease process and action of medications discussed with the patient. I discussed primary/secondary prevention and also dietary counseling was done. Patient presents for follow-up of recent hospitalization. She was initially admitted for PV angiogram with angioplasty. On the day following angioplasty, she developed fever and was found to have osteomyelitis of the left lower extremity. She subsequently underwent left BKA on 04/20/2015 and was discharged to a rehabilitation facility. She presents today for follow-up. She is now noted to have ischemic changes to right foot with ulceration to 4th  digit of right foot with known occluded right SFA. Suspect she will likely require amputation of one or more digits of right foot, however, could potentially prevent ampuation of foot or leg with attempt at revascularization of right SFA. Will proceed with repeat PV angiography and possible angioplasty. Patient understands the risks, benefits, alternatives including medical therapy, CT angiography. Patient understands <1-2% risk of death, embolic complications, bleeding, infection, renal failure, urgent surgical revascularization, but not limited to these and wants to proceed. Daughter present at the bedside and all questions answered. Follow up after procedure.  *I have discussed this case with Dr. Jacinto Halim and he personally examined  the patient and participated in formulating the plan.*    Signed by Shannon Rice, [name] (04/28/2015 4:36 PM)

## 2015-05-16 ENCOUNTER — Non-Acute Institutional Stay (SKILLED_NURSING_FACILITY): Payer: Medicare Other | Admitting: Nurse Practitioner

## 2015-05-16 DIAGNOSIS — R52 Pain, unspecified: Secondary | ICD-10-CM | POA: Diagnosis not present

## 2015-05-16 DIAGNOSIS — R531 Weakness: Secondary | ICD-10-CM | POA: Diagnosis not present

## 2015-05-16 DIAGNOSIS — I10 Essential (primary) hypertension: Secondary | ICD-10-CM

## 2015-05-16 DIAGNOSIS — G548 Other nerve root and plexus disorders: Secondary | ICD-10-CM

## 2015-05-16 DIAGNOSIS — G546 Phantom limb syndrome with pain: Secondary | ICD-10-CM

## 2015-05-16 DIAGNOSIS — E43 Unspecified severe protein-calorie malnutrition: Secondary | ICD-10-CM | POA: Diagnosis not present

## 2015-05-16 DIAGNOSIS — I70229 Atherosclerosis of native arteries of extremities with rest pain, unspecified extremity: Secondary | ICD-10-CM

## 2015-05-16 DIAGNOSIS — K5909 Other constipation: Secondary | ICD-10-CM

## 2015-05-16 DIAGNOSIS — I998 Other disorder of circulatory system: Secondary | ICD-10-CM

## 2015-05-16 DIAGNOSIS — I739 Peripheral vascular disease, unspecified: Secondary | ICD-10-CM | POA: Diagnosis not present

## 2015-05-16 NOTE — Progress Notes (Signed)
Patient ID: Shannon SkeetersJohnetta M Gartman, female   DOB: February 22, 1931, 81 y.o.   MRN: [id number]    Nursing Home Location:  [facility]   Place of Service: SNF (31)  PCP: Ralene OkMOREIRA,ROY, MD  No Known Allergies  Chief Complaint  Patient presents with  . Discharge Note    HPI:  Patient is a 81 y.o. female seen today at [facility] for discharge.  pt with a hx of scleroderma, HTN, PVD, HLD. Patient [facility] at [facility] for short [facility] rehabilitation post hospital admission from 04/12/15-04/22/15 with left foot gangrene with claudication and osteomyelitis and underwent left lower extremity transtibial amputation by Dr. Lajoyce Cornersuda on 04/20/15.Pt also has right lower limb ischemia with calcification and stenosis and is currently on medical management.  Patient currently doing well with therapy, now stable to discharge home with family and home health.  Review of Systems:  Review of Systems  Constitutional: Negative for fever, activity change, appetite change, fatigue and unexpected weight change.  HENT: Negative for congestion and hearing loss.   Eyes: Negative.   Respiratory: Negative for cough and shortness of breath.   Cardiovascular: Negative for chest pain, palpitations and leg swelling.  Gastrointestinal: Negative for abdominal pain, diarrhea and constipation.  Genitourinary: Negative for dysuria and difficulty urinating.  Musculoskeletal: Negative for myalgias and arthralgias.  Skin: Negative for color change.  Neurological: Negative for dizziness and weakness.  Psychiatric/Behavioral: Positive for confusion (memory loss noted). Negative for behavioral problems and agitation.    Past Medical History  Diagnosis Date  . Hypertension   . GERD (gastroesophageal reflux disease)   . Lupus (HCC)     skin, scleraderma  . Scleroderma (HCC)   . Arthritis     scleroderma  . Peripheral vascular disease [facility](HCC)    Past Surgical History  Procedure Laterality Date  .  Abdominal hysterectomy    . Coronary angioplasty      Dr Jenne CampusMcQueen  . Sympathectomy Right 01/27/2015    Procedure: RIGHT RADICAL ARTERY SYMPATHECTOMY, RIGHT ULNAR ARTERY SYMPATHECTOMY, DIGITAL SYMPATHECTOMY RIGHT INDEX, MIDDLE, RING SMALL;  Surgeon: Cindee SaltGary Kuzma, MD;  Location: [facility];  Service: Orthopedics;  Laterality: Right;  . Amputation Right 01/27/2015    Procedure: AMPUTATION DISTAL TIP RIGHT INDEX FINGER;  Surgeon: Cindee SaltGary Kuzma, MD;  Location: [facility];  Service: Orthopedics;  Laterality: Right;  . Peripheral vascular catheterization N/A 02/15/2015    Procedure: Lower Extremity Angiography;  Surgeon: Yates DecampJay Ganji, MD;  [facility];  Service: Cardiovascular;  Laterality: N/A;  . Peripheral vascular catheterization Left 04/12/2015    Procedure: Lower Extremity Angiography;  Surgeon: Yates DecampJay Ganji, MD;  [facility];  Service: Cardiovascular;  Laterality: Left;  . Peripheral vascular catheterization Left 04/12/2015    Procedure: Peripheral Vascular Atherectomy;  Surgeon: Yates DecampJay Ganji, MD;  [facility];  Service: Cardiovascular;  Laterality: Left;  sfa     . Amputation Left 04/20/2015    Procedure: Left Below Knee Amputation;  Surgeon: Nadara MustardMarcus Duda V, MD;  [facility];  Service: Orthopedics;  Laterality: Left;   Social History:   reports that she quit smoking about 5 months ago. Her smoking use included Cigarettes. She has a 15 pack-year smoking history. She has never used smokeless tobacco. She reports that she drinks alcohol. She reports that she does not use illicit drugs.  Family History  Problem Relation Age of Onset  . Diabetes Sister   . Diabetes  Brother     Medications: Patient's Medications  New Prescriptions   No medications on file  Previous Medications   ACETAMINOPHEN (TYLENOL) 325 MG TABLET    Take 2 tablets (650 mg total) by mouth every 6 (six) hours as needed for mild pain (or Fever >/= 101).    AMLODIPINE-BENAZEPRIL (LOTREL) 5-20 MG PER CAPSULE    Take 1 capsule by mouth daily.   ASPIRIN EC 81 MG EC TABLET    Take 1 tablet (81 mg total) by mouth daily.   CILOSTAZOL (PLETAL) 50 MG TABLET    Take 50 mg by mouth daily.   CLOPIDOGREL (PLAVIX) 75 MG TABLET    Take 1 tablet (75 mg total) by mouth daily.   DIPHENHYDRAMINE-ZINC ACETATE (BENADRYL) CREAM    Apply 1 application topically 3 (three) times daily as needed for itching.   METHOCARBAMOL (ROBAXIN) 500 MG TABLET    Take 1 tablet (500 mg total) by mouth every 6 (six) hours as needed for muscle spasms.   METOPROLOL TARTRATE (LOPRESSOR) 25 MG TABLET    Take 25 mg by mouth 2 (two) times daily.   MORPHINE (MS CONTIN) 15 MG 12 HR TABLET    Take 1 tablet (15 mg total) by mouth every 12 (twelve) hours.   ONDANSETRON (ZOFRAN ODT) 4 MG DISINTEGRATING TABLET    Take 1 tablet (4 mg total) by mouth every 8 (eight) hours as needed for nausea or vomiting.   OXYCODONE (ROXICODONE) 5 MG IMMEDIATE RELEASE TABLET    Take 1-2 tablets (5-10 mg total) by mouth every 4 (four) hours as needed for severe pain.   PITAVASTATIN CALCIUM (LIVALO) 2 MG TABS    Take 2 mg by mouth 2 (two) times daily.   POLYETHYLENE GLYCOL (MIRALAX / GLYCOLAX) PACKET    Take 17 g by mouth daily as needed for moderate constipation.   PREGABALIN (LYRICA) 50 MG CAPSULE    Take 1 capsule (50 mg total) by mouth 3 (three) times daily.   UNABLE TO FIND    Med Name: Med Pass 120 cc by mouth twice daily between meals for nutrition supplement  Modified Medications   No medications on file  Discontinued Medications   ERYTHROMYCIN OPHTHALMIC OINTMENT    Place 1 application into both eyes at bedtime.   VALACYCLOVIR (VALTREX) 1000 MG TABLET    Take 1,000 mg by mouth 3 (three) times daily.     Physical Exam: Filed Vitals:   05/16/15 1629  BP: 119/43  Pulse: 70  Temp: 98.4 F (36.9 C)  Resp: 20    Physical Exam  Constitutional: No distress.  Thin AA female NAD  HENT:  Head: Normocephalic  and atraumatic.  Cardiovascular: Normal rate, regular rhythm and normal heart sounds.   Pulmonary/Chest: Effort normal and breath sounds normal.  Abdominal: Soft. Bowel sounds are normal. She exhibits no distension. There is no tenderness.  Musculoskeletal:  Left BKA Right foot with mild erythema with mild swelling  Neurological: She is alert.  Skin: Skin is warm and dry. She is not diaphoretic.  Psychiatric: She has a normal mood and affect.    Labs reviewed: Basic Metabolic Panel:  Recent Labs  16/10/96 0425 04/19/15 1947 04/21/15 0530  NA 134* 133* 133*  K 3.9 4.2 3.9  CL 95* 94* 97*  CO2 30 32 30  GLUCOSE 106* 137* 101*  BUN CREATININE 0.48 0.67 0.42*  CALCIUM 8.7* 8.3* 8.2*   Liver Function Tests:  Recent Labs  04/13/15 1515  04/19/15 1947  AST 23 20  ALT 10* 11*  ALKPHOS 52 53  BILITOT 0.6 0.6  PROT 5.6* 6.1*  ALBUMIN 2.7* 2.6*    Recent Labs  04/13/15 1515  LIPASE 13*   No results for input(s): AMMONIA in the last 8760 hours. CBC:  Recent Labs  12/08/14 0755  04/19/15 1947 04/21/15 0530 04/21/15 1452 04/22/15 0412  WBC 5.6  < > 6.5 4.9 6.1  --   NEUTROABS 3.5  --  4.7  --   --   --   HGB 12.7  < > 8.8* 7.3* 9.5* 9.2*  HCT 39.6  < > 28.4* 23.2* 30.6* 28.6*  MCV 94.3  < > 94.4 95.9 93.9  --   PLT 135*  < > 220 187 204  --   < > = values in this interval not displayed. TSH: No results for input(s): TSH in the last 8760 hours. A1C: No results found for: HGBA1C Lipid Panel: No results for input(s): CHOL, HDL, LDLCALC, TRIG, CHOLHDL, LDLDIRECT in the last 8760 hours.   Assessment/Plan 1. Critical lower limb ischemia With Left foot gangrene now s/p left bka for osteomyelitis. Pain controlled. Continue plavix for now with aspirin and pletal. Continue prn robaxin for muscle spasm and lyrica for neuropathic pain  2. Phantom pain (HCC) -stable with lyrica 50 mg TID  3. Peripheral artery disease (HCC) Following with vascular due to  right LE PAD. Per epic 80% calcified stenosis to right femoral. Continue MS Contin 15 mg BID with oxycodone 5 mg PRN 1-2 tab q4h prn pain. Continue aspirin, pletal and plavix for now.   4. Essential hypertension Stable, lotrel 5-20 mg daily and lopressor 25 mg BID  5. Other constipation - stable, conts on miralax daily   6. Protein-calorie malnutrition, severe (HCC) conts on supplement, and outpatient to monitor weights  7. Generalized weakness Improving with therapy but with generalized weakness pt is stable for discharge-will need PT/OT/Nursing/hha per home health. DME needed includes WC and 3-1. Rx written.  will need to follow up with PCP within 2 weeks.  Janene Harvey. Biagio Borg  [facility] [phone] 8 am - 5 pm) [phone] (after hours)

## 2015-05-17 ENCOUNTER — Observation Stay (HOSPITAL_COMMUNITY)
Admission: RE | Admit: 2015-05-17 | Discharge: 2015-05-19 | Disposition: A | Discharge status: Skilled Nursing Facility | Payer: Medicare Other | Source: Ambulatory Visit | Attending: Cardiology | Admitting: Cardiology

## 2015-05-17 ENCOUNTER — Encounter (HOSPITAL_COMMUNITY): Payer: Self-pay | Admitting: General Practice

## 2015-05-17 ENCOUNTER — Encounter (HOSPITAL_COMMUNITY)
Admission: RE | Disposition: A | Discharge status: Skilled Nursing Facility | Payer: TRICARE For Life (TFL) | Source: Ambulatory Visit | Attending: Cardiology

## 2015-05-17 DIAGNOSIS — Z89512 Acquired absence of left leg below knee: Secondary | ICD-10-CM | POA: Diagnosis not present

## 2015-05-17 DIAGNOSIS — K219 Gastro-esophageal reflux disease without esophagitis: Secondary | ICD-10-CM | POA: Diagnosis not present

## 2015-05-17 DIAGNOSIS — Z87891 Personal history of nicotine dependence: Secondary | ICD-10-CM | POA: Diagnosis not present

## 2015-05-17 DIAGNOSIS — Z79899 Other long term (current) drug therapy: Secondary | ICD-10-CM | POA: Insufficient documentation

## 2015-05-17 DIAGNOSIS — I10 Essential (primary) hypertension: Secondary | ICD-10-CM | POA: Insufficient documentation

## 2015-05-17 DIAGNOSIS — I70261 Atherosclerosis of native arteries of extremities with gangrene, right leg: Secondary | ICD-10-CM | POA: Diagnosis not present

## 2015-05-17 DIAGNOSIS — Z79891 Long term (current) use of opiate analgesic: Secondary | ICD-10-CM | POA: Diagnosis not present

## 2015-05-17 DIAGNOSIS — L97519 Non-pressure chronic ulcer of other part of right foot with unspecified severity: Secondary | ICD-10-CM | POA: Insufficient documentation

## 2015-05-17 DIAGNOSIS — I6523 Occlusion and stenosis of bilateral carotid arteries: Secondary | ICD-10-CM | POA: Insufficient documentation

## 2015-05-17 DIAGNOSIS — E7800 Pure hypercholesterolemia, unspecified: Secondary | ICD-10-CM | POA: Insufficient documentation

## 2015-05-17 DIAGNOSIS — Z792 Long term (current) use of antibiotics: Secondary | ICD-10-CM | POA: Diagnosis not present

## 2015-05-17 DIAGNOSIS — I70229 Atherosclerosis of native arteries of extremities with rest pain, unspecified extremity: Secondary | ICD-10-CM | POA: Diagnosis present

## 2015-05-17 DIAGNOSIS — I739 Peripheral vascular disease, unspecified: Secondary | ICD-10-CM | POA: Diagnosis present

## 2015-05-17 DIAGNOSIS — M349 Systemic sclerosis, unspecified: Secondary | ICD-10-CM | POA: Diagnosis not present

## 2015-05-17 DIAGNOSIS — M1990 Unspecified osteoarthritis, unspecified site: Secondary | ICD-10-CM | POA: Insufficient documentation

## 2015-05-17 DIAGNOSIS — Z7982 Long term (current) use of aspirin: Secondary | ICD-10-CM | POA: Insufficient documentation

## 2015-05-17 DIAGNOSIS — Z9862 Peripheral vascular angioplasty status: Secondary | ICD-10-CM | POA: Insufficient documentation

## 2015-05-17 DIAGNOSIS — Z7902 Long term (current) use of antithrombotics/antiplatelets: Secondary | ICD-10-CM | POA: Insufficient documentation

## 2015-05-17 DIAGNOSIS — I998 Other disorder of circulatory system: Secondary | ICD-10-CM | POA: Diagnosis present

## 2015-05-17 DIAGNOSIS — I7777 Dissection of artery of lower extremity: Secondary | ICD-10-CM | POA: Diagnosis not present

## 2015-05-17 HISTORY — PX: PERIPHERAL VASCULAR CATHETERIZATION: SHX172C

## 2015-05-17 HISTORY — DX: Cardiac murmur, unspecified: R01.1

## 2015-05-17 HISTORY — DX: Peripheral vascular disease, unspecified: I73.9

## 2015-05-17 HISTORY — PX: PERIPHERAL ARTERIAL STENT GRAFT: SHX2220

## 2015-05-17 LAB — POCT ACTIVATED CLOTTING TIME
ACTIVATED CLOTTING TIME: 288 s
ACTIVATED CLOTTING TIME: 275 s
Activated Clotting Time: 220 seconds
Activated Clotting Time: 233 seconds
Activated Clotting Time: 183 seconds

## 2015-05-17 SURGERY — LOWER EXTREMITY INTERVENTION
Anesthesia: LOCAL

## 2015-05-17 MED ORDER — LIDOCAINE HCL (PF) 1 % IJ SOLN
INTRAMUSCULAR | Status: AC
  Filled 2015-05-17: qty 30

## 2015-05-17 MED ORDER — PRAVASTATIN SODIUM 40 MG PO TABS
20.0000 mg | ORAL_TABLET | Freq: Every day | ORAL | Status: DC
  Administered 2015-05-17 – 2015-05-18 (×2): 20 mg via ORAL
  Filled 2015-05-17 (×2): qty 1

## 2015-05-17 MED ORDER — METHOCARBAMOL 500 MG PO TABS: 500.0000 mg | ORAL_TABLET | Freq: Four times a day (QID) | ORAL | Status: DC | PRN

## 2015-05-17 MED ORDER — NITROGLYCERIN IN D5W 200-5 MCG/ML-% IV SOLN
INTRAVENOUS | Status: AC
  Filled 2015-05-17: qty 250

## 2015-05-17 MED ORDER — ENSURE ENLIVE PO LIQD
237.0000 mL | Freq: Two times a day (BID) | ORAL | Status: DC
  Administered 2015-05-18: 237 mL via ORAL
  Filled 2015-05-17 (×10): qty 237

## 2015-05-17 MED ORDER — SODIUM CHLORIDE 0.9 % IV SOLN: INTRAVENOUS | Status: DC

## 2015-05-17 MED ORDER — MORPHINE SULFATE ER 15 MG PO TBCR
15.0000 mg | EXTENDED_RELEASE_TABLET | Freq: Two times a day (BID) | ORAL | Status: DC
  Administered 2015-05-17 – 2015-05-19 (×3): 15 mg via ORAL
  Filled 2015-05-17 (×4): qty 1

## 2015-05-17 MED ORDER — BENAZEPRIL HCL 20 MG PO TABS
20.0000 mg | ORAL_TABLET | Freq: Every day | ORAL | Status: DC
  Administered 2015-05-17: 19:00:00 20 mg via ORAL
  Filled 2015-05-17 (×2): qty 1

## 2015-05-17 MED ORDER — ACETAMINOPHEN 325 MG PO TABS: 650.0000 mg | ORAL_TABLET | Freq: Four times a day (QID) | ORAL | Status: DC | PRN

## 2015-05-17 MED ORDER — ONDANSETRON HCL 4 MG/2ML IJ SOLN
INTRAMUSCULAR | Status: AC
  Filled 2015-05-17: qty 2

## 2015-05-17 MED ORDER — VERAPAMIL HCL 2.5 MG/ML IV SOLN
INTRAVENOUS | Status: DC | PRN
  Administered 2015-05-17: 400 ug via INTRA_ARTERIAL

## 2015-05-17 MED ORDER — HEPARIN SODIUM (PORCINE) 1000 UNIT/ML IJ SOLN
INTRAMUSCULAR | Status: AC
  Filled 2015-05-17: qty 1

## 2015-05-17 MED ORDER — METOPROLOL TARTRATE 25 MG PO TABS
25.0000 mg | ORAL_TABLET | Freq: Two times a day (BID) | ORAL | Status: DC
  Administered 2015-05-17 – 2015-05-18 (×2): 25 mg via ORAL
  Filled 2015-05-17 (×2): qty 1

## 2015-05-17 MED ORDER — OXYCODONE HCL 5 MG PO TABS
5.0000 mg | ORAL_TABLET | ORAL | Status: DC | PRN
  Administered 2015-05-17 – 2015-05-19 (×2): 5 mg via ORAL
  Filled 2015-05-17 (×2): qty 1

## 2015-05-17 MED ORDER — ZOLPIDEM TARTRATE 5 MG PO TABS: 5.0000 mg | ORAL_TABLET | Freq: Every evening | ORAL | Status: DC | PRN

## 2015-05-17 MED ORDER — SODIUM CHLORIDE 0.9 % IV SOLN
1.0000 mL/kg/h | INTRAVENOUS | Status: AC
  Administered 2015-05-17 (×2): 1 mL/kg/h via INTRAVENOUS

## 2015-05-17 MED ORDER — ONDANSETRON HCL 4 MG/2ML IJ SOLN
4.0000 mg | Freq: Four times a day (QID) | INTRAMUSCULAR | Status: DC | PRN
  Administered 2015-05-17: 16:00:00 4 mg via INTRAVENOUS

## 2015-05-17 MED ORDER — ASPIRIN EC 81 MG PO TBEC
81.0000 mg | DELAYED_RELEASE_TABLET | Freq: Every day | ORAL | Status: DC
  Administered 2015-05-18 – 2015-05-19 (×2): 81 mg via ORAL
  Filled 2015-05-17 (×2): qty 1

## 2015-05-17 MED ORDER — FENTANYL CITRATE (PF) 100 MCG/2ML IJ SOLN
INTRAMUSCULAR | Status: DC | PRN
  Administered 2015-05-17: 50 ug via INTRAVENOUS
  Administered 2015-05-17: 25 ug via INTRAVENOUS

## 2015-05-17 MED ORDER — LIDOCAINE HCL (PF) 1 % IJ SOLN
INTRAMUSCULAR | Status: DC | PRN
  Administered 2015-05-17: 30 mL

## 2015-05-17 MED ORDER — IODIXANOL 320 MG/ML IV SOLN
INTRAVENOUS | Status: DC | PRN
  Administered 2015-05-17: 225 mL via INTRAVENOUS

## 2015-05-17 MED ORDER — HYDROMORPHONE HCL 1 MG/ML IJ SOLN
INTRAMUSCULAR | Status: AC
  Administered 2015-05-17: 0.5 mg via INTRAVENOUS
  Filled 2015-05-17: qty 1

## 2015-05-17 MED ORDER — MIDAZOLAM HCL 2 MG/2ML IJ SOLN
INTRAMUSCULAR | Status: AC
  Filled 2015-05-17: qty 4

## 2015-05-17 MED ORDER — VERAPAMIL HCL 2.5 MG/ML IV SOLN
INTRAVENOUS | Status: AC
Start: 2015-05-17 — End: 2015-05-17
  Filled 2015-05-17: qty 2

## 2015-05-17 MED ORDER — CLOPIDOGREL BISULFATE 75 MG PO TABS
75.0000 mg | ORAL_TABLET | Freq: Every day | ORAL | Status: DC
  Administered 2015-05-18 – 2015-05-19 (×2): 75 mg via ORAL
  Filled 2015-05-17 (×2): qty 1

## 2015-05-17 MED ORDER — MIDAZOLAM HCL 2 MG/2ML IJ SOLN
INTRAMUSCULAR | Status: DC | PRN
  Administered 2015-05-17 (×3): 1 mg via INTRAVENOUS

## 2015-05-17 MED ORDER — CILOSTAZOL 100 MG PO TABS
50.0000 mg | ORAL_TABLET | Freq: Every day | ORAL | Status: DC
Start: 2015-05-18 — End: 2015-05-19
  Administered 2015-05-18 – 2015-05-19 (×2): 50 mg via ORAL
  Filled 2015-05-17 (×2): qty 1

## 2015-05-17 MED ORDER — AMLODIPINE BESYLATE 5 MG PO TABS
5.0000 mg | ORAL_TABLET | Freq: Every day | ORAL | Status: DC
  Administered 2015-05-17: 19:00:00 5 mg via ORAL
  Filled 2015-05-17 (×2): qty 1

## 2015-05-17 MED ORDER — AMLODIPINE BESY-BENAZEPRIL HCL 5-20 MG PO CAPS: 1.0000 | ORAL_CAPSULE | Freq: Every day | ORAL | Status: DC

## 2015-05-17 MED ORDER — HYDROMORPHONE HCL 1 MG/ML IJ SOLN
0.5000 mg | INTRAMUSCULAR | Status: AC
  Administered 2015-05-17: 0.5 mg via INTRAVENOUS

## 2015-05-17 MED ORDER — FENTANYL CITRATE (PF) 100 MCG/2ML IJ SOLN
INTRAMUSCULAR | Status: AC
  Filled 2015-05-17: qty 4

## 2015-05-17 MED ORDER — ANGIOPLASTY BOOK
Freq: Once | Status: AC
  Administered 2015-05-17: 21:00:00
  Filled 2015-05-17: qty 1

## 2015-05-17 MED ORDER — SODIUM CHLORIDE 0.9 % IV BOLUS (SEPSIS)
500.0000 mL | Freq: Once | INTRAVENOUS | Status: AC
  Administered 2015-05-17: 500 mL via INTRAVENOUS

## 2015-05-17 MED ORDER — MORPHINE SULFATE (PF) 4 MG/ML IV SOLN: 4.0000 mg | INTRAVENOUS | Status: DC | PRN

## 2015-05-17 MED ORDER — VERAPAMIL HCL 2.5 MG/ML IV SOLN
INTRAVENOUS | Status: AC
  Filled 2015-05-17: qty 2

## 2015-05-17 MED ORDER — HEPARIN SODIUM (PORCINE) 1000 UNIT/ML IJ SOLN
INTRAMUSCULAR | Status: DC | PRN
  Administered 2015-05-17: 6000 [IU] via INTRAVENOUS
  Administered 2015-05-17 (×2): 2000 [IU] via INTRAVENOUS

## 2015-05-17 MED ORDER — ONDANSETRON 4 MG PO TBDP
4.0000 mg | ORAL_TABLET | Freq: Three times a day (TID) | ORAL | Status: DC | PRN
Start: 2015-05-17 — End: 2015-05-19
  Administered 2015-05-17: 20:00:00 4 mg via ORAL
  Filled 2015-05-17 (×3): qty 1

## 2015-05-17 MED ORDER — POLYETHYLENE GLYCOL 3350 17 G PO PACK: 17.0000 g | PACK | Freq: Every day | ORAL | Status: DC | PRN

## 2015-05-17 MED ORDER — PREGABALIN 25 MG PO CAPS
50.0000 mg | ORAL_CAPSULE | Freq: Three times a day (TID) | ORAL | Status: DC
  Administered 2015-05-17 – 2015-05-19 (×7): 50 mg via ORAL
  Filled 2015-05-17 (×7): qty 2

## 2015-05-17 SURGICAL SUPPLY — 29 items
BALLN ARMADA 6X60X80 (BALLOONS) ×2
BALLN LUTONIX DCB 6X80X130 (BALLOONS) ×2
BALLOON ARMADA 6X60X80 (BALLOONS) ×1 IMPLANT
BALLOON LUTONIX DCB 6X80X130 (BALLOONS) ×1 IMPLANT
BALLOON SABER 6.0X60X150 (BALLOONS) ×2 IMPLANT
CATH CROSS OVER TEMPO 5F (CATHETERS) ×2 IMPLANT
CATH CXI SUPP ANG 2.6FR 150CM (MICROCATHETER) ×2 IMPLANT
CATH TEMPO AQUA 5F 100CM (CATHETERS) ×2 IMPLANT
DEVICE EMBOSHIELD NAV6 4.0-7.0 (WIRE) ×2 IMPLANT
DIAMONDBACK SOLID OAS 2.0MM (CATHETERS) ×2
FEM STOP ARCH (HEMOSTASIS) ×1
GUIDEWIRE REGALIA .014X300CM (WIRE) ×2 IMPLANT
KIT ENCORE 26 ADVANTAGE (KITS) ×2 IMPLANT
KIT MICROINTRODUCER STIFF 5F (SHEATH) ×2 IMPLANT
KIT PV (KITS) ×2 IMPLANT
LUBRICANT VIPERSLIDE CORONARY (MISCELLANEOUS) ×2 IMPLANT
SHEATH FLEX ANSEL ANG 6F 45CM (SHEATH) ×2 IMPLANT
SHEATH PINNACLE 6F 10CM (SHEATH) ×2 IMPLANT
SYR MEDRAD MARK V 150ML (SYRINGE) ×2 IMPLANT
SYRINGE MEDRAD AVANTA MACH 7 (SYRINGE) ×2 IMPLANT
SYSTEM COMPRESSION FEMOSTOP (HEMOSTASIS) ×1 IMPLANT
SYSTEM DIMNDBCK SLD OAS 2.0MM (CATHETERS) ×1 IMPLANT
TAPE RADIOPAQUE TURBO (MISCELLANEOUS) ×2 IMPLANT
TRANSDUCER W/STOPCOCK (MISCELLANEOUS) ×2 IMPLANT
TRAY PV CATH (CUSTOM PROCEDURE TRAY) ×2 IMPLANT
WIRE COUGAR XT STRL 300CM (WIRE) ×2 IMPLANT
WIRE HITORQ VERSACORE ST 145CM (WIRE) ×2 IMPLANT
WIRE TREASURE-12 .018X300CM (WIRE) ×2 IMPLANT
WIRE VIPER ADVANCE .017X335CM (WIRE) ×2 IMPLANT

## 2015-05-17 NOTE — Interval H&P Note (Signed)
History and Physical Interval Note:  05/17/2015 12:07 PM  Shannon HooverJohnetta Murriel HopperM Rice  has presented today for surgery, with the diagnosis of Claudication   The various methods of treatment have been discussed with the patient and family. After consideration of risks, benefits and other options for treatment, the patient has consented to  Procedure(s): Lower Extremity Intervention (N/A) and possible as a surgical intervention .  The patient's history has been reviewed, patient examined, no change in status, stable for surgery.  I have reviewed the patient's chart and labs.  Questions were answered to the patient's satisfaction.     Yates DecampGANJI, Ola Fawver

## 2015-05-18 ENCOUNTER — Encounter (HOSPITAL_COMMUNITY): Payer: Self-pay | Admitting: *Deleted

## 2015-05-18 DIAGNOSIS — I70261 Atherosclerosis of native arteries of extremities with gangrene, right leg: Secondary | ICD-10-CM | POA: Diagnosis not present

## 2015-05-18 MED ORDER — HEPARIN SODIUM (PORCINE) 5000 UNIT/ML IJ SOLN
5000.0000 [IU] | Freq: Two times a day (BID) | INTRAMUSCULAR | Status: DC
  Administered 2015-05-18 (×2): 5000 [IU] via SUBCUTANEOUS
  Filled 2015-05-18 (×3): qty 1

## 2015-05-18 MED ORDER — BENAZEPRIL HCL 20 MG PO TABS
10.0000 mg | ORAL_TABLET | Freq: Every day | ORAL | Status: DC
Start: 2015-05-18 — End: 2015-05-19
  Administered 2015-05-19: 12:00:00 10 mg via ORAL
  Filled 2015-05-18: qty 1

## 2015-05-18 MED ORDER — AMLODIPINE BESYLATE 5 MG PO TABS
5.0000 mg | ORAL_TABLET | Freq: Every day | ORAL | Status: DC
  Administered 2015-05-19: 12:00:00 5 mg via ORAL
  Filled 2015-05-18: qty 1

## 2015-05-18 MED ORDER — ONDANSETRON HCL 4 MG/2ML IJ SOLN
4.0000 mg | Freq: Four times a day (QID) | INTRAMUSCULAR | Status: DC | PRN
  Administered 2015-05-19: 4 mg via INTRAVENOUS
  Filled 2015-05-18 (×2): qty 2

## 2015-05-18 MED FILL — Nitroglycerin IV Soln 100 MCG/ML in D5W: INTRA_ARTERIAL | Qty: 10 | Status: AC

## 2015-05-18 NOTE — Consult Note (Signed)
Reason for Consult: Fourth toe Referring Physician: Dr. Beverly MilchGanji  Shannon Murriel HopperM Rice is an 81 y.o. female.  HPI: Patient is an 89-year-old woman with severe peripheral vascular disease who is status post angioplasty for the right lower extremity. Patient is seen for dry gangrene of her forefoot.  Past Medical History  Diagnosis Date  . Hypertension   . GERD (gastroesophageal reflux disease)   . Lupus (HCC)     skin, scleraderma  . Scleroderma (HCC)   . Arthritis     scleroderma  . Peripheral vascular disease (HCC)   . PAD (peripheral artery disease) [facility](HCC)     Past Surgical History  Procedure Laterality Date  . Abdominal hysterectomy    . Coronary angioplasty      Dr Jenne CampusMcQueen  . Sympathectomy Right 01/27/2015    Procedure: RIGHT RADICAL ARTERY SYMPATHECTOMY, RIGHT ULNAR ARTERY SYMPATHECTOMY, DIGITAL SYMPATHECTOMY RIGHT INDEX, MIDDLE, RING SMALL;  Surgeon: Cindee SaltGary Kuzma, MD;  Location: [facility];  Service: Orthopedics;  Laterality: Right;  . Amputation Right 01/27/2015    Procedure: AMPUTATION DISTAL TIP RIGHT INDEX FINGER;  Surgeon: Cindee SaltGary Kuzma, MD;  Location: [facility];  Service: Orthopedics;  Laterality: Right;  . Peripheral vascular catheterization N/A 02/15/2015    Procedure: Lower Extremity Angiography;  Surgeon: Yates DecampJay Ganji, MD;  [facility];  Service: Cardiovascular;  Laterality: N/A;  . Peripheral vascular catheterization Left 04/12/2015    Procedure: Lower Extremity Angiography;  Surgeon: Yates DecampJay Ganji, MD;  [facility];  Service: Cardiovascular;  Laterality: Left;  . Peripheral vascular catheterization Left 04/12/2015    Procedure: Peripheral Vascular Atherectomy;  Surgeon: Yates DecampJay Ganji, MD;  [facility];  Service: Cardiovascular;  Laterality: Left;  sfa     . Amputation Left 04/20/2015    Procedure: Left Below Knee Amputation;  Surgeon: Nadara MustardMarcus Bryor Rami V, MD;  [facility];  Service: Orthopedics;  Laterality:  Left;  . Peripheral arterial stent graft Left 05/17/2015    Family History  Problem Relation Age of Onset  . Diabetes Sister   . Diabetes Brother     Social History:  reports that she quit smoking about 5 months ago. Her smoking use included Cigarettes. She has a 15 pack-year smoking history. She has never used smokeless tobacco. She reports that she drinks alcohol. She reports that she does not use illicit drugs.  Allergies: No Known Allergies  Medications: I have reviewed the patient's current medications.  Results for orders placed or performed during the hospital encounter of 05/17/15 (from the past 48 hour(s))  POCT Activated clotting time     Status: None   Collection Time: 05/17/15 12:37 PM  Result Value Ref Range   Activated Clotting Time 288 seconds  POCT Activated clotting time     Status: None   Collection Time: 05/17/15  1:38 PM  Result Value Ref Range   Activated Clotting Time 275 seconds  POCT Activated clotting time     Status: None   Collection Time: 05/17/15  2:24 PM  Result Value Ref Range   Activated Clotting Time 220 seconds  POCT Activated clotting time     Status: None   Collection Time: 05/17/15  2:57 PM  Result Value Ref Range   Activated Clotting Time 233 seconds  POCT Activated clotting time     Status: None   Collection Time: 05/17/15  4:01 PM  Result Value Ref Range   Activated Clotting Time 183 seconds    No  results found.  Review of Systems  All other systems reviewed and are negative.  Blood pressure 123/39, pulse 86, temperature 98.9 F (37.2 C), temperature source Oral, resp. rate 21, height  (1.575 m), weight 40.2 kg (88 lb 10 oz), SpO2 100 %. Physical Exam On examination patient has dry gangrenous changes of the fourth toe and very mild dry gangrenous changes to the tip of the first and second toe. There is no cellulitis there is no abscess no swelling no red streaking no signs of infection. Patient states that her toes do not  hurt. Assessment/Plan: Assessment: Painless dry gangrene of toes 121 for right foot.  Plan: Since the patient is asymptomatic told her we can just follow this as an outpatient. Discussed that if she did require surgery she would at least require a transmetatarsal amputation due to the ischemic changes of 3 out of 5 toes.  Shannon Rice 05/18/2015, 12:51 PM

## 2015-05-18 NOTE — Progress Notes (Addendum)
Subjective:   Feels well and states she has not required any pain medications, states that she feels well.  Objective:  Vital Signs in the last 24 hours: Temp:  [97 F (36.1 C)-98.9 F (37.2 C)] 98.9 F (37.2 C) (11/09 0803) Pulse Rate:  [0-115] 86 (11/09 0803) Resp:  [0-31] 21 (11/09 0803) BP: (111-228)/(23-120) 123/39 mmHg (11/09 0547) SpO2:  [0 %-100 %] 100 % (11/09 0803) Weight:  [40.2 kg (88 lb 10 oz)-45.36 kg (100 lb)] 40.2 kg (88 lb 10 oz) (11/09 0547)  Intake/Output from previous day: 11/08 0701 - 11/09 0700 In: 467 [P.O.:240; I.V.:227] Out: 600 [Urine:600]  Physical Exam:  General appearance: alert, cooperative, appears stated age and appears chronically ill. Resp: clear to auscultation bilaterally Chest wall: no tenderness Cardio: regular rate and rhythm, S1, S2 normal, no murmur, click, rub or gallop Extremities: no edema, redness or tenderness in the calves or thighs, Left leg stump healing well below the knee. Right leg up to the toes is now hot/warm and previously cold foot yesterday prior to angioplasty. Right 3rd toe chronically ischemic and appears to have dry gangrene, rest of the 4 toes at the tip also show gangrenous changes which are chronic. Pulses: Pulses: FEM: present 2+ and bruit, POP: absent, DP: absent, PT: absent right. Left BKA.  Lab Results: BMP  Recent Labs  04/18/15 0425 04/19/15 1947 04/21/15 0530  NA 134* 133* 133*  K 3.9 4.2 3.9  CL 95* 94* 97*  CO2 30 32 30  GLUCOSE 106* 137* 101*  BUN CREATININE 0.48 0.67 0.42*  CALCIUM 8.7* 8.3* 8.2*  GFRNONAA >60 >60 >60  GFRAA >60 >60 >60   Hepatic Function Panel  Recent Labs  04/13/15 1515 04/19/15 1947  PROT 5.6* 6.1*  ALBUMIN 2.7* 2.6*  AST 23 20  ALT 10* 11*  ALKPHOS 52 53  BILITOT 0.6 0.6   Scheduled Meds: . amLODipine  5 mg Oral Daily   And  . benazepril  20 mg Oral Daily  . aspirin EC  81 mg Oral Daily  . cilostazol  50 mg Oral Daily  . clopidogrel  75 mg Oral  Daily  . feeding supplement (ENSURE ENLIVE)  237 mL Oral BID BM  . metoprolol tartrate  25 mg Oral BID  . morphine  15 mg Oral Q12H  . pravastatin  20 mg Oral q1800  . pregabalin  50 mg Oral TID   Continuous Infusions:  PRN Meds:.acetaminophen, methocarbamol, morphine injection, ondansetron, oxyCODONE, polyethylene glycol, zolpidem   Assessment/Plan:  1. Critical limb ischemia, right lower extremity. Successful atherectomy and drug coated balloon angioplasty of the right external iliac and right common femoral and right superficial femoral artery. There was loss of single-vessel runoff, peroneal artery however this morning her leg is very warm, pain is completely subsided, suspect that she will save her limb, she may need amputation of the right third toe, however this can be watched to see how the gangrene will be demarcated over the next few days to few weeks. Pain control has been tremendous, patient feels well.  Recommendation: I will keep her here for 24 hours, discharge her in the morning with outpatient follow-up. I'll obtain ABI/TBI today. 2. Hypertension. Due to borderline low blood pressure, I will discontinue metoprolol due to her history of severe PAD and also history of systemic sclerosis and scleroderma, reduce lisinopril from 20 mg to 10 mg. 3. I will request Dr. Lajoyce Corners to see whether he can see her  today if not she can certainly follow-up with him in the outpatient basis.  Yates DecampGANJI, Finlee Milo, M.D. 05/18/2015, 9:13 AM [facility] Pager: [phone] Office: [phone] If no answer: [phone]

## 2015-05-19 ENCOUNTER — Encounter (HOSPITAL_COMMUNITY): Payer: Self-pay | Admitting: General Practice

## 2015-05-19 DIAGNOSIS — I70261 Atherosclerosis of native arteries of extremities with gangrene, right leg: Secondary | ICD-10-CM | POA: Diagnosis not present

## 2015-05-19 NOTE — Progress Notes (Signed)
Physician Discharge Summary  Patient ID: Shannon Rice MRN: [id number] DOB/AGE: 720-Feb-1932 81 y.o.  Admit date: 05/17/2015 Discharge date: 05/19/2015  Primary Discharge Diagnosis 1. Critical limb ischemia, right lower extremity. Successful atherectomy and drug coated balloon angioplasty of the right external iliac and right common femoral and right superficial femoral artery. There was loss of single-vessel runoff, peroneal artery however the following morning her entire right leg is very warm, pain is completely subsided, suspect that she will save her limb,  Pain control has been tremendous, patient feels well. H/O Left BKA.  Secondary Discharge Diagnosis 1. Systemic slceresosis 2. Benign essential HTN 3. Scleroderma 4. Hyperlipidemia  Significant Diagnostic Studies: 05/17/2015: Left femoral arterial access using micropuncture technique, placement of a 6 French Ansel sheath and crossover to the right common iliac artery, right iliac arteriogram and distal runoff, PTA and atherectomy of the right external iliac artery for a high-grade 80% stenosis reduced to less than 10%, PTA and atherectomy of the right common femoral artery, 90% stenosis reduced to less than 20%. Drug-coated balloon angioplasty of the right external and common femoral artery with a 6.0 x 80 mm Shannon Rice balloon, balloon angioplasty with a 6.0 x 60 mm balloon into the right common femoral artery. PTA and atherectomy of the right distal SFA. Atherectomy was performed using a 2 mm solid crown CSI device. Distal SFA stenosis reduced from 90% to less than 10%.  Below the right knee there was one-vessel runoff prior to the beginning of the procedure, there was a high-grade lesion in the peroneal artery, this cannot occluded following angioplasty in spite of use of Nav 6 Filter wire.  Failed attempt to open up the peroneal artery due to dissection.  Hospital Course: Admitted for CLI therapy due to non healing ulcer and dry  gangrene and pain. Kept 48 hours due to complexcity of the PAD. Did well post procedure with complete resolution of pain and the entire right leg is warm including the foot. Will be discharged to SNF. Metoprolol discontinued due to severe small vessel PAD, systemic slcerosis and low BP.   Recommendations on discharge: F/u Dr. Lajoyce Cornersuda as planned previously. His consult 05/18/2015: Since the patient is asymptomatic told her we can just follow this as an outpatient. Discussed that if she did require surgery she would at least require a transmetatarsal amputation due to the ischemic changes of 3 out of 5 toes. Will obtain ABI/TBI.  Discharge Exam: Blood pressure 131/29, pulse 81, temperature 98 F (36.7 C), temperature source Oral, resp. rate 16, height 5\' 2"  (1.575 m), weight 39.4 kg (86 lb 13.8 oz), SpO2 94 %.   General appearance: alert, cooperative, appears stated age and appears chronically ill. Resp: clear to auscultation bilaterally Chest wall: no tenderness Cardio: regular rate and rhythm, S1, S2 normal, no murmur, click, rub or gallop Extremities: no edema, redness or tenderness in the calves or thighs, Left leg stump healing well below the knee. Right leg up to the toes is now hot/warm and previously cold foot yesterday prior to angioplasty. Right 3rd toe chronically ischemic and appears to have dry gangrene, rest of the 4 toes at the tip also show gangrenous changes which are chronic. Pulses: Pulses: FEM: present 2+ and bruit, POP: absent, DP: absent, PT: absent right. Left BKA.  Labs:   Lab Results  Component Value Date   WBC 6.1 04/21/2015   HGB 9.2* 04/22/2015   HCT 28.6* 04/22/2015   MCV 93.9 04/21/2015   PLT 204 04/21/2015  FOLLOW UP PLANS AND APPOINTMENTS    Medication List    STOP taking these medications        metoprolol tartrate 25 MG tablet  Commonly known as:  LOPRESSOR      TAKE these medications        acetaminophen 325 MG tablet  Commonly known as:  TYLENOL   Take 2 tablets (650 mg total) by mouth every 6 (six) hours as needed for mild pain (or Fever >/= 101).     amLODipine-benazepril 5-20 MG capsule  Commonly known as:  LOTREL  Take 1 capsule by mouth daily.     aspirin 81 MG EC tablet  Take 1 tablet (81 mg total) by mouth daily.     cilostazol 50 MG tablet  Commonly known as:  PLETAL  Take 50 mg by mouth daily.     clopidogrel 75 MG tablet  Commonly known as:  PLAVIX  Take 1 tablet (75 mg total) by mouth daily.     LIVALO 2 MG Tabs  Generic drug:  Pitavastatin Calcium  Take 2 mg by mouth 2 (two) times daily.     methocarbamol 500 MG tablet  Commonly known as:  ROBAXIN  Take 1 tablet (500 mg total) by mouth every 6 (six) hours as needed for muscle spasms.     morphine 15 MG 12 hr tablet  Commonly known as:  MS CONTIN  Take 1 tablet (15 mg total) by mouth every 12 (twelve) hours.     ondansetron 4 MG disintegrating tablet  Commonly known as:  ZOFRAN ODT  Take 1 tablet (4 mg total) by mouth every 8 (eight) hours as needed for nausea or vomiting.     oxyCODONE 5 MG immediate release tablet  Commonly known as:  ROXICODONE  Take 1-2 tablets (5-10 mg total) by mouth every 4 (four) hours as needed for severe pain.     polyethylene glycol packet  Commonly known as:  MIRALAX / GLYCOLAX  Take 17 g by mouth daily as needed for moderate constipation.     pregabalin 50 MG capsule  Commonly known as:  LYRICA  Take 1 capsule (50 mg total) by mouth 3 (three) times daily.           Follow-up Information    Follow up with DUDA,MARCUS V, MD In 2 weeks.   Specialty:  Orthopedic Surgery   Contact information:   [address] [place] [address] [phone]       Follow up with [name], MD.   Specialty:  Cardiology   Contact information:   [address] [place] [address] [phone]        [name], MD 05/19/2015, 9:07 AM  Pager: [phone] Office: [phone] If no answer:  [phone]

## 2015-05-19 NOTE — Evaluation (Signed)
Physical Therapy Evaluation Patient Details Name: Shannon SkeetersJohnetta M Rice MRN: [id number] DOB: [birth year] Today's Date: 05/19/2015   History of Present Illness  Pt is an 81 y/o female with a PMH that includes HTN, lupus, PVD. Pt initially presented 10/4 for angiogram and was scheduled for d/c. This was held due to low grade fevers and concern for osteomyelitis of the L great toe. Pt s/p L transtibial amputation 10/12.  Clinical Impression  Pt was able to assist transfers but very worn out from the effort.  Md requested consideration for CIR but feel this is too much intensity for pt's current level but would be very appropriate for reconsideration as she improves.  Plan is for pt to go home with daughter staying with her to allow for extra assistance.    Follow Up Recommendations SNF    Equipment Recommendations  None recommended by PT    Recommendations for Other Services Rehab consult     Precautions / Restrictions Precautions Precautions: Fall Precaution Comments: Lt LE BKA Restrictions Weight Bearing Restrictions: Yes      Mobility  Bed Mobility Overal bed mobility: Modified Independent             General bed mobility comments: increase time and used rail to pull self to upright position  Transfers Overall transfer level: Needs assistance Equipment used: 1 person hand held assist Transfers: Sit to/from Stand Sit to Stand: Max assist;Mod assist         General transfer comment: had to support R knee with PT knees to control for buckling  Ambulation/Gait             General Gait Details: Not able  Stairs            Wheelchair Mobility    Modified Rankin (Stroke Patients Only)       Balance Overall balance assessment: Needs assistance Sitting-balance support: Single extremity supported;Bilateral upper extremity supported Sitting balance-Leahy Scale: Fair Sitting balance - Comments: Pt able to weight shift while sitting EOB while engaging in  dymanic reaching task     Standing balance-Leahy Scale: Poor                               Pertinent Vitals/Pain Pain Assessment: No/denies pain    Home Living Family/patient expects to be discharged to:: Skilled nursing facility                      Prior Function Level of Independence: Needs assistance      ADL's / Homemaking Assistance Needed: Pt reports she recieves assistance from SNF staff for bathing and dressing        Hand Dominance   Dominant Hand: Right    Extremity/Trunk Assessment   Upper Extremity Assessment: Overall WFL for tasks assessed           Lower Extremity Assessment: Generalized weakness;LLE deficits/detail (April 20 2015 BK amputation)   LLE Deficits / Details: NWB and partial healing on surgery site  Cervical / Trunk Assessment: Normal  Communication   Communication: No difficulties  Cognition Arousal/Alertness: Awake/alert Behavior During Therapy: WFL for tasks assessed/performed Overall Cognitive Status: Impaired/Different from baseline Area of Impairment: Orientation Orientation Level: Disoriented to;Place;Time                  General Comments General comments (skin integrity, edema, etc.): Daughter present during evaluation    Exercises  Assessment/Plan    PT Assessment Patient needs continued PT services  PT Diagnosis Generalized weakness   PT Problem List Decreased strength;Decreased range of motion;Decreased activity tolerance;Decreased balance;Decreased mobility;Decreased coordination;Decreased knowledge of use of DME;Decreased safety awareness;Decreased knowledge of precautions;Cardiopulmonary status limiting activity;Pain;Decreased skin integrity  PT Treatment Interventions DME instruction;Gait training;Stair training;Functional mobility training;Therapeutic activities;Therapeutic exercise;Balance training;Neuromuscular re-education;Patient/family education   PT Goals (Current goals  can be found in the Care Plan section) Acute Rehab PT Goals Patient Stated Goal: none stated PT Goal Formulation: With patient/family Time For Goal Achievement: 06/02/15 Potential to Achieve Goals: Good    Frequency Min 2X/week   Barriers to discharge Other (comment) (2 person assist to transition to chair safely) ready for SNF before home    Co-evaluation               End of Session Equipment Utilized During Treatment: Gait belt Activity Tolerance: Patient tolerated treatment well;Patient limited by fatigue;Patient limited by lethargy Patient left: in bed;with call bell/phone within reach;with family/visitor present Nurse Communication: Mobility status         Time: 6578-4696 PT Time Calculation (min) (ACUTE ONLY): 23 min   Charges:   PT Evaluation $Initial PT Evaluation Tier I: 1 Procedure PT Treatments $Therapeutic Activity: 8-22 mins   PT G Codes:        Ivar Drape May 28, 2015, 12:36 PM   Samul Dada, PT MS Acute Rehab Dept. Number: [facility] [id number] and [facility] [phone]

## 2015-05-19 NOTE — Clinical Social Work Note (Signed)
Ms. Orson AloeHenderson is [facility] back [facility] today. Authorization received from [facility] rep. Beth.  Authorization [id number] and RUG level - RVA. Patient will be transported by ambulance to facility. Daughter updated.   Genelle BalVanessa Oretha Weismann, MSW, LCSW Licensed Clinical Social Worker Clinical Social Work Department [facility] [phone]

## 2015-05-19 NOTE — [place] FL2 (Signed)
[place] MEDICAID FL2 LEVEL OF CARE SCREENING TOOL     IDENTIFICATION  Patient Name: Shannon Rice Birthdate: [birth year] Sex: female Admission Date (Current Location): 05/17/2015  [facility] and [place] Number: Producer, television/film/video and Address:  [facility] [place], [address], [place], [place] [address]      Provider Number: [id number]  Attending Physician Name and Address:  [name], MD  Relative Name and Phone Number:  Marye Round, daughter.  [phone]    Current Level of Care: Hospital Recommended Level of Care: Nursing Facility Prior Approval Number:    Date Approved/Denied:   PASRR Number: [id number] (Effective 04/21/15)  Discharge Plan: SNF    Current Diagnoses: Patient Active Problem List   Diagnosis Date Noted  . Protein-calorie malnutrition, severe (HCC) 04/19/2015  . Osteomyelitis (HCC) 04/14/2015  . HCAP (healthcare-associated pneumonia) 04/13/2015  . Herpes zoster keratoconjunctivitis 04/13/2015  . HTN (hypertension) 04/13/2015  . Constipation 04/13/2015  . Hypokalemia 04/13/2015  . Hypoxia   . Critical lower limb ischemia 04/10/2015  . Peripheral artery disease (HCC) 02/10/2015  . Finger, open wounds, complicated   . Scleroderma (HCC)     Orientation ACTIVITIES/SOCIAL BLADDER RESPIRATION    Self, Time, Situation, Place  Passive, Family supportive Continent Normal  BEHAVIORAL SYMPTOMS/MOOD NEUROLOGICAL BOWEL NUTRITION STATUS      Continent Diet (Heart Healthy diet)  PHYSICIAN VISITS COMMUNICATION OF NEEDS Height & Weight Skin    Verbally   86 lbs. Normal          AMBULATORY STATUS RESPIRATION     Assist Extensive Normal      Personal Care Assistance Level of Assistance  Bathing, Feeding, Dressing Bathing - Maximum Assistance Feeding - Limited Assistance (set-up) Dressing - Maximum Assistance   Functional Limitations Info                SPECIAL CARE FACTORS FREQUENCY  PT (By licensed PT), OT (By  licensed OT)  PT evaluated 05/19/15. Recommended a minimum of 2x per week OT evaluated 05/19/15. Recommended a minimum of 2x per week.                 Additional Factors Info  Code Status, Allergies Code Status Info: Full Code Allergies Info: No known allergies           Current Medications (05/19/2015): Current Facility-Administered Medications  Medication Dose Route Frequency Provider Last Rate Last Dose  . acetaminophen (TYLENOL) tablet 650 mg  650 mg Oral Q6H PRN [name], MD      . benazepril (LOTENSIN) tablet 10 mg  10 mg Oral Daily [name], MD   10 mg at 05/19/15 1159   And  . amLODipine (NORVASC) tablet 5 mg  5 mg Oral Daily [name], MD   5 mg at 05/19/15 1158  . aspirin EC tablet 81 mg  81 mg Oral Daily [name], MD   81 mg at 05/19/15 1156  . cilostazol (PLETAL) tablet 50 mg  50 mg Oral Daily [name], MD   50 mg at 05/19/15 1157  . clopidogrel (PLAVIX) tablet 75 mg  75 mg Oral Daily [name], MD   75 mg at 05/19/15 1159  . feeding supplement (ENSURE ENLIVE) (ENSURE ENLIVE) liquid 237 mL  237 mL Oral BID BM [name], MD   237 mL at 05/18/15 1800  . heparin injection 5,000 Units  5,000 Units Subcutaneous Q12H [name], MD   5,000 Units at 05/18/15 2200  . methocarbamol (ROBAXIN)  tablet 500 mg  500 mg Oral Q6H PRN [name], MD      . morphine (MS CONTIN) 12 hr tablet 15 mg  15 mg Oral Q12H [name], MD   15 mg at 05/19/15 1155  . morphine 4 MG/ML injection 4 mg  4 mg Intravenous Q1H PRN [name], MD      . ondansetron [facility]) injection 4 mg  4 mg Intravenous Q6H PRN [name], MD   4 mg at 05/19/15 0250  . ondansetron (ZOFRAN-ODT) disintegrating tablet 4 mg  4 mg Oral Q8H PRN [name], MD   4 mg at 05/17/15 1943  . oxyCODONE (Oxy IR/ROXICODONE) immediate release tablet 5-10 mg  5-10 mg Oral Q4H PRN [name], MD   5 mg at 05/17/15 1859  . polyethylene glycol (MIRALAX / GLYCOLAX) packet 17 g  17 g Oral Daily PRN [name], MD      . pravastatin (PRAVACHOL)  tablet 20 mg  20 mg Oral q1800 [name], MD   20 mg at 05/18/15 1833  . pregabalin (LYRICA) capsule 50 mg  50 mg Oral TID [name], MD   50 mg at 05/19/15 1154  . zolpidem (AMBIEN) tablet 5 mg  5 mg Oral QHS PRN [name], MD       Do not use this list as official medication orders. Please verify with discharge summary.  Discharge Medications:   Medication List    STOP taking these medications        metoprolol tartrate 25 MG tablet  Commonly known as:  LOPRESSOR      TAKE these medications        acetaminophen 325 MG tablet  Commonly known as:  TYLENOL  Take 2 tablets (650 mg total) by mouth every 6 (six) hours as needed for mild pain (or Fever >/= 101).     amLODipine-benazepril 5-20 MG capsule  Commonly known as:  LOTREL  Take 1 capsule by mouth daily.     aspirin 81 MG EC tablet  Take 1 tablet (81 mg total) by mouth daily.     cilostazol 50 MG tablet  Commonly known as:  PLETAL  Take 50 mg by mouth daily.     clopidogrel 75 MG tablet  Commonly known as:  PLAVIX  Take 1 tablet (75 mg total) by mouth daily.     LIVALO 2 MG Tabs  Generic drug:  Pitavastatin Calcium  Take 2 mg by mouth 2 (two) times daily.     methocarbamol 500 MG tablet  Commonly known as:  ROBAXIN  Take 1 tablet (500 mg total) by mouth every 6 (six) hours as needed for muscle spasms.     morphine 15 MG 12 hr tablet  Commonly known as:  MS CONTIN  Take 1 tablet (15 mg total) by mouth every 12 (twelve) hours.     ondansetron 4 MG disintegrating tablet  Commonly known as:  ZOFRAN ODT  Take 1 tablet (4 mg total) by mouth every 8 (eight) hours as needed for nausea or vomiting.     oxyCODONE 5 MG immediate release tablet  Commonly known as:  ROXICODONE  Take 1-2 tablets (5-10 mg total) by mouth every 4 (four) hours as needed for severe pain.     polyethylene glycol packet  Commonly known as:  MIRALAX / GLYCOLAX  Take 17 g by mouth daily as needed for moderate constipation.     pregabalin 50  MG capsule  Commonly known as:  LYRICA  Take 1 capsule (50 mg total) by mouth 3 (three) times daily.        Relevant Imaging Results:  Relevant Lab Results:  Recent Labs    Additional Information    Cristobal GoldmannCrawford, Sherronda Sweigert Bradley, LCSW

## 2015-05-19 NOTE — Discharge Summary (Signed)
Physician Discharge Summary  Patient ID: Shannon SkeetersJohnetta M Bewick MRN: [id number] DOB/AGE: [birth year] 81 y.o.  Admit date: 05/17/2015 Discharge date: 05/19/2015  Primary Discharge Diagnosis 1. Critical limb ischemia, right lower extremity. Successful atherectomy and drug coated balloon angioplasty of the right external iliac and right common femoral and right superficial femoral artery. There was loss of single-vessel runoff, peroneal artery however the following morning her entire right leg is very warm, pain is completely subsided, suspect that she will save her limb,  Pain control has been tremendous, patient feels well. H/O Left BKA.  Secondary Discharge Diagnosis 1. Systemic slceresosis 2. Benign essential HTN 3. Scleroderma 4. Hyperlipidemia  Significant Diagnostic Studies: 05/17/2015: Left femoral arterial access using micropuncture technique, placement of a 6 French Ansel sheath and crossover to the right common iliac artery, right iliac arteriogram and distal runoff, PTA and atherectomy of the right external iliac artery for a high-grade 80% stenosis reduced to less than 10%, PTA and atherectomy of the right common femoral artery, 90% stenosis reduced to less than 20%. Drug-coated balloon angioplasty of the right external and common femoral artery with a 6.0 x 80 mm Lutonix balloon, balloon angioplasty with a 6.0 x 60 mm balloon into the right common femoral artery. PTA and atherectomy of the right distal SFA. Atherectomy was performed using a 2 mm solid crown CSI device. Distal SFA stenosis reduced from 90% to less than 10%.  Below the right knee there was one-vessel runoff prior to the beginning of the procedure, there was a high-grade lesion in the peroneal artery, this cannot occluded following angioplasty in spite of use of Nav 6 Filter wire.  Failed attempt to open up the peroneal artery due to dissection.  Hospital Course: Admitted for CLI therapy due to non healing ulcer and dry  gangrene and pain. Kept 48 hours due to complexcity of the PAD. Did well post procedure with complete resolution of pain and the entire right leg is warm including the foot. Will be discharged to SNF. Metoprolol discontinued due to severe small vessel PAD, systemic slcerosis and low BP.   Recommendations on discharge: F/u Dr. Lajoyce Cornersuda as planned previously. His consult 05/18/2015: Since the patient is asymptomatic told her we can just follow this as an outpatient. Discussed that if she did require surgery she would at least require a transmetatarsal amputation due to the ischemic changes of 3 out of 5 toes. Will obtain ABI/TBI.  Discharge Exam: Blood pressure 131/29, pulse 81, temperature 98 F (36.7 C), temperature source Oral, resp. rate 16, height 5\' 2"  (1.575 m), weight 39.4 kg (86 lb 13.8 oz), SpO2 94 %.   General appearance: alert, cooperative, appears stated age and appears chronically ill. Resp: clear to auscultation bilaterally Chest wall: no tenderness Cardio: regular rate and rhythm, S1, S2 normal, no murmur, click, rub or gallop Extremities: no edema, redness or tenderness in the calves or thighs, Left leg stump healing well below the knee. Right leg up to the toes is now hot/warm and previously cold foot yesterday prior to angioplasty. Right 3rd toe chronically ischemic and appears to have dry gangrene, rest of the 4 toes at the tip also show gangrenous changes which are chronic. Pulses: Pulses: FEM: present 2+ and bruit, POP: absent, DP: absent, PT: absent right. Left BKA.  Labs:   Lab Results  Component Value Date   WBC 6.1 04/21/2015   HGB 9.2* 04/22/2015   HCT 28.6* 04/22/2015   MCV 93.9 04/21/2015   PLT 204 04/21/2015  FOLLOW UP PLANS AND APPOINTMENTS    Medication List    STOP taking these medications        metoprolol tartrate 25 MG tablet  Commonly known as:  LOPRESSOR      TAKE these medications        acetaminophen 325 MG tablet  Commonly known as:  TYLENOL   Take 2 tablets (650 mg total) by mouth every 6 (six) hours as needed for mild pain (or Fever >/= 101).     amLODipine-benazepril 5-20 MG capsule  Commonly known as:  LOTREL  Take 1 capsule by mouth daily.     aspirin 81 MG EC tablet  Take 1 tablet (81 mg total) by mouth daily.     cilostazol 50 MG tablet  Commonly known as:  PLETAL  Take 50 mg by mouth daily.     clopidogrel 75 MG tablet  Commonly known as:  PLAVIX  Take 1 tablet (75 mg total) by mouth daily.     LIVALO 2 MG Tabs  Generic drug:  Pitavastatin Calcium  Take 2 mg by mouth 2 (two) times daily.     methocarbamol 500 MG tablet  Commonly known as:  ROBAXIN  Take 1 tablet (500 mg total) by mouth every 6 (six) hours as needed for muscle spasms.     morphine 15 MG 12 hr tablet  Commonly known as:  MS CONTIN  Take 1 tablet (15 mg total) by mouth every 12 (twelve) hours.     ondansetron 4 MG disintegrating tablet  Commonly known as:  ZOFRAN ODT  Take 1 tablet (4 mg total) by mouth every 8 (eight) hours as needed for nausea or vomiting.     oxyCODONE 5 MG immediate release tablet  Commonly known as:  ROXICODONE  Take 1-2 tablets (5-10 mg total) by mouth every 4 (four) hours as needed for severe pain.     polyethylene glycol packet  Commonly known as:  MIRALAX / GLYCOLAX  Take 17 g by mouth daily as needed for moderate constipation.     pregabalin 50 MG capsule  Commonly known as:  LYRICA  Take 1 capsule (50 mg total) by mouth 3 (three) times daily.       Follow-up Information    Follow up with DUDA,MARCUS V, MD In 2 weeks.   Specialty:  Orthopedic Surgery   Contact information:   [address] [place] [address] [phone]       Follow up with [name], MD.   Specialty:  Cardiology   Contact information:   [address] [place] [address] [phone]        [name], MD 05/19/2015, 9:15 AM  Pager: [phone] Office: [phone] If no answer:  [phone]

## 2015-05-19 NOTE — Progress Notes (Signed)
Patient discharged to rehab center with daughter. Report given to nursing supervisor Desiree. Transferred by NT in wheelchair to daughters car and assisted into the car.

## 2015-05-19 NOTE — Progress Notes (Signed)
[facility] transportation cancelled.  Pt's daughter will provide transport, per RN.

## 2015-05-19 NOTE — Clinical Social Work Note (Signed)
Clinical Social Work Assessment  Patient Details  Name: Shannon Rice MRN: [id number] Date of Birth: Nov 14, 1930  Date of referral:  05/18/15               Reason for consult:  Facility Placement                Permission sought to share information with:  Family Supports (Daughter in room) Permission granted to share information::  Yes, Verbal Permission Granted  Name::     Shannon Rice  Agency::     Relationship::  Daughter  Contact Information:  [phone]  Housing/Transportation Living arrangements for the past 2 months:  Skilled Nursing Facility Source of Information:  Adult Children (CSW talked at bedside with patient and daughter Shannon Rice) Patient Interpreter Needed:  None Criminal Activity/Legal Involvement Pertinent to Current Situation/Hospitalization:  No - Comment as needed Significant Relationships:    Lives with:  Adult Children Do you feel safe going back to the place where you live?  Yes Need for family participation in patient care:  Yes (Comment)  Care giving concerns:  None expressed by patient or daughter.   Social Worker assessment / plan:  CSW talked with patient and daughter to confirm return to [facility]. Patient in bed, alert and acknowledged CSW, however allowed daughter to primarily engage in conversation.  CSW explained that prior authorization would have to be received from patient's insurance before her return to SNF and daughter expressed understanding.  Employment status:  Retired Database administratornsurance information:  Managed Medicare, Other (Comment Required) (Patient has [employer] Insurance, along with [employer] Medicare) PT Recommendations:  Skilled Nursing Facility Information / Referral to community resources:  Skilled Nursing Facility  Patient/Family's Response to care:  No concerns expressed regarding care during hospitalization.  Patient/Family's Understanding of and Emotional Response to Diagnosis, Current Treatment, and Prognosis:  Not  discussed.  Emotional Assessment Appearance:  Appears stated age Attitude/Demeanor/Rapport:  Other (Appropriate. ) Affect (typically observed):  Appropriate, Quiet Orientation:  Oriented to Self, Oriented to Situation, Oriented to Place Alcohol / Substance use:  Never Used Psych involvement (Current and /or in the community):  No (Comment)  Discharge Needs  Concerns to be addressed:  Discharge Planning Concerns Readmission within the last 30 days:  No Current discharge risk:  None Barriers to Discharge:  No Barriers Identified   Cristobal GoldmannCrawford, Aaryav Hopfensperger Bradley, LCSW 05/19/2015, 2:43 PM

## 2015-05-19 NOTE — Evaluation (Signed)
Occupational Therapy Evaluation Patient Details Name: Shannon SkeetersJohnetta M Rice MRN: [id number] DOB: 1930-09-16 Today's Date: 05/19/2015    History of Present Illness Pt is an 81 y/o female with a PMH that includes HTN, lupus, PVD. Pt initially presented 10/4 for angiogram and was scheduled for d/c. This was held due to low grade fevers and concern for osteomyelitis of the L great toe. Pt s/p L transtibial amputation 10/12.   Clinical Impression   Pt admitted to hospital due to reason stated above. Pt currently with functional limitiations due to the deficits listed below (see OT problem list). Prior to admission pt was receiving set up to total assistance from staff at SNF for ADLs, pt currently requiring same level of assistance for ADL completion. Deferring all other OT needs to SNF.    Follow Up Recommendations  SNF    Equipment Recommendations  Other (comment) (TBD next venue)    Recommendations for Other Services       Precautions / Restrictions Precautions Precautions: Fall Precaution Comments: Lt LE BKA Restrictions Weight Bearing Restrictions: Yes      Mobility Bed Mobility Overal bed mobility: Modified Independent             General bed mobility comments: increase time and used rail to pull self to upright position  Transfers                 General transfer comment: Pt decline any type of transfer this session. Pt reported "I dont feel well to try and stand".    Balance Overall balance assessment: Needs assistance Sitting-balance support: No upper extremity supported;Feet supported Sitting balance-Leahy Scale: Fair Sitting balance - Comments: Pt able to weight shift while sitting EOB while engaging in dymanic reaching task                                    ADL Overall ADL's : Needs assistance/impaired                                       General ADL Comments: Pt requires set up to total assistance for ADLs. Prior  to admission pt was recieving assistance for bathing and dressing from staff at skilled nursing facility. Per daughter pt was only sponge bathing at EOB and she was able to wash parts of UB and LB and could dressing UB with set up and required total assist for LB dressing. Pt unable to don/doff RLE sock required total assistance for completion. Pt was performing squat-pivot transfer with mod-max assistance at SNF for w/c to chair transfer for ADL participation.     Vision     Perception     Praxis      Pertinent Vitals/Pain Pain Assessment: No/denies pain     Hand Dominance Right   Extremity/Trunk Assessment Upper Extremity Assessment Upper Extremity Assessment: Overall WFL for tasks assessed   Lower Extremity Assessment Lower Extremity Assessment: Defer to PT evaluation   Cervical / Trunk Assessment Cervical / Trunk Assessment: Normal   Communication Communication Communication: No difficulties   Cognition Arousal/Alertness: Awake/alert Behavior During Therapy: WFL for tasks assessed/performed Overall Cognitive Status: Impaired/Different from baseline Area of Impairment: Orientation Orientation Level: Disoriented to;Place;Time                 General Comments  Exercises       Shoulder Instructions      Home Living Family/patient expects to be discharged to:: Skilled nursing facility                                        Prior Functioning/Environment Level of Independence: Needs assistance    ADL's / Homemaking Assistance Needed: Pt reports she recieves assistance from SNF staff for bathing and dressing        OT Diagnosis: Generalized weakness;Acute pain   OT Problem List: Decreased strength;Decreased activity tolerance;Impaired balance (sitting and/or standing);Decreased cognition;Decreased knowledge of use of DME or AE;Pain   OT Treatment/Interventions:      OT Goals(Current goals can be found in the care plan section)  Acute Rehab OT Goals Patient Stated Goal: none stated  OT Frequency:     Barriers to D/C:            Co-evaluation              End of Session    Activity Tolerance: Patient limited by fatigue Patient left: in bed;with call bell/phone within reach;with nursing/sitter in room;with family/visitor present   Time: 1610-9604 OT Time Calculation (min): 13 min Charges:  OT General Charges $OT Visit: 1 Procedure OT Evaluation $Initial OT Evaluation Tier I: 1 Procedure G-Codes:    Smiley Houseman 06-04-2015, 12:25 PM

## 2015-05-19 NOTE — [place] FL2 (Deleted)
[place] MEDICAID FL2 LEVEL OF CARE SCREENING TOOL     IDENTIFICATION  Patient Name: Shannon Rice Birthdate: Mar 12, 1931 Sex: female Admission Date (Current Location): 05/17/2015  [facility] [place]   Facility and Address:  [facility], [address], [place], [place] [address]      Provider Number: [id number]  Attending Physician Name and Address:  Yates DecampJay Ganji, MD  Relative Name and Phone Number:  Marye RoundVickie Moore, daughter.  [phone]    Current Level of Care: Hospital Recommended Level of Care: Nursing Facility Prior Approval Number:    Date Approved/Denied:   PASRR Number: [id number] (Effective 04/21/15)  Discharge Plan: SNF    Current Diagnoses: Patient Active Problem List   Diagnosis Date Noted  . Protein-calorie malnutrition, severe (HCC) 04/19/2015  . Osteomyelitis (HCC) 04/14/2015  . HCAP (healthcare-associated pneumonia) 04/13/2015  . Herpes zoster keratoconjunctivitis 04/13/2015  . HTN (hypertension) 04/13/2015  . Constipation 04/13/2015  . Hypokalemia 04/13/2015  . Hypoxia   . Critical lower limb ischemia 04/10/2015  . Peripheral artery disease (HCC) 02/10/2015  . Finger, open wounds, complicated   . Scleroderma (HCC)     Orientation ACTIVITIES/SOCIAL BLADDER RESPIRATION    Self, Time, Situation, Place  Passive, Family supportive Continent Normal  BEHAVIORAL SYMPTOMS/MOOD NEUROLOGICAL BOWEL NUTRITION STATUS      Continent Diet (Heart Healthy diet)  PHYSICIAN VISITS COMMUNICATION OF NEEDS Height & Weight Skin    Verbally   86 lbs. Normal          AMBULATORY STATUS RESPIRATION     Patient unable to ambulate during evaluation on 05/19/15. Normal      Personal Care Assistance Level of Assistance  Bathing, Feeding, Dressing Bathing - Maximum assistance Feeding - Assistance with set-up needed, patient independent with eating. Dressing - Maximum assistance   Functional Limitations Info                 SPECIAL CARE FACTORS FREQUENCY  PT (By licensed PT), OT (By licensed OT)  OT evaluation 05/19/15 PT evaluation 05/19/15      OT recommended a minimum of 2 times per week  PT recommended a minimum of 2 times per week            Additional Factors Info  Code Status, Allergies Code Status Info: Full Code Allergies Info: No known allergies           Current Medications (05/19/2015): Current Facility-Administered Medications  Medication Dose Route Frequency Provider Last Rate Last Dose  . acetaminophen (TYLENOL) tablet 650 mg  650 mg Oral Q6H PRN Yates DecampJay Ganji, MD      . benazepril (LOTENSIN) tablet 10 mg  10 mg Oral Daily Yates DecampJay Ganji, MD   10 mg at 05/19/15 1159   And  . amLODipine (NORVASC) tablet 5 mg  5 mg Oral Daily Yates DecampJay Ganji, MD   5 mg at 05/19/15 1158  . aspirin EC tablet 81 mg  81 mg Oral Daily Yates DecampJay Ganji, MD   81 mg at 05/19/15 1156  . cilostazol (PLETAL) tablet 50 mg  50 mg Oral Daily Yates DecampJay Ganji, MD   50 mg at 05/19/15 1157  . clopidogrel (PLAVIX) tablet 75 mg  75 mg Oral Daily Yates DecampJay Ganji, MD   75 mg at 05/19/15 1159  . feeding supplement (ENSURE ENLIVE) (ENSURE ENLIVE) liquid 237 mL  237 mL Oral BID BM Yates DecampJay Ganji, MD   237 mL at 05/18/15 1800  . heparin injection 5,000 Units  5,000 Units  Subcutaneous Q12H [name], MD   5,000 Units at 05/18/15 2200  . methocarbamol (ROBAXIN) tablet 500 mg  500 mg Oral Q6H PRN [name], MD      . morphine (MS CONTIN) 12 hr tablet 15 mg  15 mg Oral Q12H [name], MD   15 mg at 05/19/15 1155  . morphine 4 MG/ML injection 4 mg  4 mg Intravenous Q1H PRN [name], MD      . ondansetron [facility]) injection 4 mg  4 mg Intravenous Q6H PRN [name], MD   4 mg at 05/19/15 0250  . ondansetron (ZOFRAN-ODT) disintegrating tablet 4 mg  4 mg Oral Q8H PRN [name], MD   4 mg at 05/17/15 1943  . oxyCODONE (Oxy IR/ROXICODONE) immediate release tablet 5-10 mg  5-10 mg Oral Q4H PRN [name], MD   5 mg at 05/17/15 1859  . polyethylene glycol (MIRALAX  / GLYCOLAX) packet 17 g  17 g Oral Daily PRN [name], MD      . pravastatin (PRAVACHOL) tablet 20 mg  20 mg Oral q1800 [name], MD   20 mg at 05/18/15 1833  . pregabalin (LYRICA) capsule 50 mg  50 mg Oral TID [name], MD   50 mg at 05/19/15 1154  . zolpidem (AMBIEN) tablet 5 mg  5 mg Oral QHS PRN [name], MD       Do not use this list as official medication orders. Please verify with discharge summary.  Discharge Medications:   Medication List    STOP taking these medications        metoprolol tartrate 25 MG tablet  Commonly known as:  LOPRESSOR      TAKE these medications        acetaminophen 325 MG tablet  Commonly known as:  TYLENOL  Take 2 tablets (650 mg total) by mouth every 6 (six) hours as needed for mild pain (or Fever >/= 101).     amLODipine-benazepril 5-20 MG capsule  Commonly known as:  LOTREL  Take 1 capsule by mouth daily.     aspirin 81 MG EC tablet  Take 1 tablet (81 mg total) by mouth daily.     cilostazol 50 MG tablet  Commonly known as:  PLETAL  Take 50 mg by mouth daily.     clopidogrel 75 MG tablet  Commonly known as:  PLAVIX  Take 1 tablet (75 mg total) by mouth daily.     LIVALO 2 MG Tabs  Generic drug:  Pitavastatin Calcium  Take 2 mg by mouth 2 (two) times daily.     methocarbamol 500 MG tablet  Commonly known as:  ROBAXIN  Take 1 tablet (500 mg total) by mouth every 6 (six) hours as needed for muscle spasms.     morphine 15 MG 12 hr tablet  Commonly known as:  MS CONTIN  Take 1 tablet (15 mg total) by mouth every 12 (twelve) hours.     ondansetron 4 MG disintegrating tablet  Commonly known as:  ZOFRAN ODT  Take 1 tablet (4 mg total) by mouth every 8 (eight) hours as needed for nausea or vomiting.     oxyCODONE 5 MG immediate release tablet  Commonly known as:  ROXICODONE  Take 1-2 tablets (5-10 mg total) by mouth every 4 (four) hours as needed for severe pain.     polyethylene glycol packet  Commonly known as:  MIRALAX /  GLYCOLAX  Take 17 g by mouth daily as needed for  moderate constipation.     pregabalin 50 MG capsule  Commonly known as:  LYRICA  Take 1 capsule (50 mg total) by mouth 3 (three) times daily.        Relevant Imaging Results:  Relevant Lab Results:  Recent Labs    Additional Information    Cristobal Goldmann, LCSW

## 2015-05-19 NOTE — Care Management Obs Status (Signed)
MEDICARE OBSERVATION STATUS NOTIFICATION   Patient Details  Name: Shannon Rice MRN: [id number] Date of Birth: 11-14-1930   Medicare Observation Status Notification Given:  Yes    Epifanio LeschesCole, Timmi Devora Hudson, RN 05/19/2015, 3:59 PM

## 2015-05-19 NOTE — [place] FL2 (Deleted)
[place] MEDICAID FL2 LEVEL OF CARE SCREENING TOOL     IDENTIFICATION  Patient Name: Shannon Rice Birthdate: 12/08/1930 Sex: female Admission Date (Current Location): 05/17/2015  [facility] and [place] Number: [place] and Address:  The [place]. [facility], [address] [place], [place] [address]      Provider Number: [id number]  Attending Physician Name and Address:  [name], MD  Relative Name and Phone Number:  Marye Round, daughter.  [phone]    Current Level of Care: Hospital Recommended Level of Care: Nursing Facility Prior Approval Number:    Date Approved/Denied:   PASRR Number: [id number] (Effective 04/21/15)  Discharge Plan: SNF    Current Diagnoses: Patient Active Problem List   Diagnosis Date Noted  . Protein-calorie malnutrition, severe (HCC) 04/19/2015  . Osteomyelitis (HCC) 04/14/2015  . HCAP (healthcare-associated pneumonia) 04/13/2015  . Herpes zoster keratoconjunctivitis 04/13/2015  . HTN (hypertension) 04/13/2015  . Constipation 04/13/2015  . Hypokalemia 04/13/2015  . Hypoxia   . Critical lower limb ischemia 04/10/2015  . Peripheral artery disease (HCC) 02/10/2015  . Finger, open wounds, complicated   . Scleroderma (HCC)     Orientation ACTIVITIES/SOCIAL BLADDER RESPIRATION    Self, Time, Situation, Place  Passive, Family supportive Continent Normal  BEHAVIORAL SYMPTOMS/MOOD NEUROLOGICAL BOWEL NUTRITION STATUS      Continent Diet (Heart Healthy diet)  PHYSICIAN VISITS COMMUNICATION OF NEEDS Height & Weight Skin    Verbally   86 lbs. Normal          AMBULATORY STATUS RESPIRATION      Normal      Personal Care Assistance Level of Assistance  Bathing, Feeding, Dressing Bathing Assistance: Limited assistance Feeding assistance: Independent Dressing Assistance: Limited assistance      Functional Limitations Info                SPECIAL CARE FACTORS FREQUENCY  PT (By licensed PT), OT  (By licensed OT)                   Additional Factors Info  Code Status, Allergies Code Status Info: Full Code Allergies Info: No known allergies           Current Medications (05/19/2015): Current Facility-Administered Medications  Medication Dose Route Frequency Provider Last Rate Last Dose  . acetaminophen (TYLENOL) tablet 650 mg  650 mg Oral Q6H PRN [name], MD      . benazepril (LOTENSIN) tablet 10 mg  10 mg Oral Daily [name], MD   10 mg at 05/19/15 1159   And  . amLODipine (NORVASC) tablet 5 mg  5 mg Oral Daily [name], MD   5 mg at 05/19/15 1158  . aspirin EC tablet 81 mg  81 mg Oral Daily [name], MD   81 mg at 05/19/15 1156  . cilostazol (PLETAL) tablet 50 mg  50 mg Oral Daily [name], MD   50 mg at 05/19/15 1157  . clopidogrel (PLAVIX) tablet 75 mg  75 mg Oral Daily [name], MD   75 mg at 05/19/15 1159  . feeding supplement (ENSURE ENLIVE) (ENSURE ENLIVE) liquid 237 mL  237 mL Oral BID BM [name], MD   237 mL at 05/18/15 1800  . heparin injection 5,000 Units  5,000 Units Subcutaneous Q12H [name], MD   5,000 Units at 05/18/15 2200  . methocarbamol (ROBAXIN) tablet 500 mg  500 mg Oral Q6H PRN [name], MD      . morphine (  MS CONTIN) 12 hr tablet 15 mg  15 mg Oral Q12H Yates DecampJay Ganji, MD   15 mg at 05/19/15 1155  . morphine 4 MG/ML injection 4 mg  4 mg Intravenous Q1H PRN Yates DecampJay Ganji, MD      . ondansetron [facility](ZOFRAN) injection 4 mg  4 mg Intravenous Q6H PRN Yates DecampJay Ganji, MD   4 mg at 05/19/15 0250  . ondansetron (ZOFRAN-ODT) disintegrating tablet 4 mg  4 mg Oral Q8H PRN Yates DecampJay Ganji, MD   4 mg at 05/17/15 1943  . oxyCODONE (Oxy IR/ROXICODONE) immediate release tablet 5-10 mg  5-10 mg Oral Q4H PRN Yates DecampJay Ganji, MD   5 mg at 05/17/15 1859  . polyethylene glycol (MIRALAX / GLYCOLAX) packet 17 g  17 g Oral Daily PRN Yates DecampJay Ganji, MD      . pravastatin (PRAVACHOL) tablet 20 mg  20 mg Oral q1800 Yates DecampJay Ganji, MD   20 mg at 05/18/15 1833  . pregabalin (LYRICA) capsule 50 mg  50 mg  Oral TID Yates DecampJay Ganji, MD   50 mg at 05/19/15 1154  . zolpidem (AMBIEN) tablet 5 mg  5 mg Oral QHS PRN Yates DecampJay Ganji, MD       Do not use this list as official medication orders. Please verify with discharge summary.  Discharge Medications:   Medication List    STOP taking these medications        metoprolol tartrate 25 MG tablet  Commonly known as:  LOPRESSOR      TAKE these medications        acetaminophen 325 MG tablet  Commonly known as:  TYLENOL  Take 2 tablets (650 mg total) by mouth every 6 (six) hours as needed for mild pain (or Fever >/= 101).     amLODipine-benazepril 5-20 MG capsule  Commonly known as:  LOTREL  Take 1 capsule by mouth daily.     aspirin 81 MG EC tablet  Take 1 tablet (81 mg total) by mouth daily.     cilostazol 50 MG tablet  Commonly known as:  PLETAL  Take 50 mg by mouth daily.     clopidogrel 75 MG tablet  Commonly known as:  PLAVIX  Take 1 tablet (75 mg total) by mouth daily.     LIVALO 2 MG Tabs  Generic drug:  Pitavastatin Calcium  Take 2 mg by mouth 2 (two) times daily.     methocarbamol 500 MG tablet  Commonly known as:  ROBAXIN  Take 1 tablet (500 mg total) by mouth every 6 (six) hours as needed for muscle spasms.     morphine 15 MG 12 hr tablet  Commonly known as:  MS CONTIN  Take 1 tablet (15 mg total) by mouth every 12 (twelve) hours.     ondansetron 4 MG disintegrating tablet  Commonly known as:  ZOFRAN ODT  Take 1 tablet (4 mg total) by mouth every 8 (eight) hours as needed for nausea or vomiting.     oxyCODONE 5 MG immediate release tablet  Commonly known as:  ROXICODONE  Take 1-2 tablets (5-10 mg total) by mouth every 4 (four) hours as needed for severe pain.     polyethylene glycol packet  Commonly known as:  MIRALAX / GLYCOLAX  Take 17 g by mouth daily as needed for moderate constipation.     pregabalin 50 MG capsule  Commonly known as:  LYRICA  Take 1 capsule (50 mg total) by mouth 3 (three) times daily.  Relevant Imaging Results:  Relevant Lab Results:  Recent Labs    Additional Information    Cristobal Goldmann, LCSW

## 2015-05-20 ENCOUNTER — Encounter (HOSPITAL_COMMUNITY): Payer: Self-pay | Admitting: Cardiology

## 2015-05-20 NOTE — Progress Notes (Signed)
Late entry due to missed g-code.  05/20/15 1300  OT G-codes **NOT FOR INPATIENT CLASS**  Functional Assessment Tool Used clinical judgement  Functional Limitation Self care  Self Care Current Status [phone](G8987) CI  Self Care Discharge Status [phone](G8989) CI  05/20/2015 Martie RoundJulie Hayzen Lorenson, OTR/L Pager: [phone]

## 2015-05-23 ENCOUNTER — Encounter (HOSPITAL_COMMUNITY): Payer: Self-pay | Admitting: Cardiology

## 2015-05-23 ENCOUNTER — Non-Acute Institutional Stay (SKILLED_NURSING_FACILITY): Payer: Medicare Other | Admitting: Internal Medicine

## 2015-05-23 DIAGNOSIS — I5032 Chronic diastolic (congestive) heart failure: Secondary | ICD-10-CM

## 2015-05-23 DIAGNOSIS — D500 Iron deficiency anemia secondary to blood loss (chronic): Secondary | ICD-10-CM

## 2015-05-23 DIAGNOSIS — K5900 Constipation, unspecified: Secondary | ICD-10-CM | POA: Diagnosis not present

## 2015-05-23 DIAGNOSIS — E46 Unspecified protein-calorie malnutrition: Secondary | ICD-10-CM | POA: Diagnosis not present

## 2015-05-23 DIAGNOSIS — E871 Hypo-osmolality and hyponatremia: Secondary | ICD-10-CM | POA: Diagnosis not present

## 2015-05-23 DIAGNOSIS — R5381 Other malaise: Secondary | ICD-10-CM

## 2015-05-23 DIAGNOSIS — I739 Peripheral vascular disease, unspecified: Secondary | ICD-10-CM

## 2015-05-23 DIAGNOSIS — I10 Essential (primary) hypertension: Secondary | ICD-10-CM

## 2015-05-23 DIAGNOSIS — R2681 Unsteadiness on feet: Secondary | ICD-10-CM | POA: Diagnosis not present

## 2015-05-23 NOTE — Progress Notes (Signed)
Patient ID: Shannon Rice, female   DOB: 26-Jul-1930, 81 y.o.   MRN: [id number]     Facility: [facility]    PCP: Ralene OkMOREIRA,ROY, MD  Code Status: DNR  Not on File  Chief Complaint  Patient presents with  . New Admit To SNF     HPI:  81 y.o. patient is here for short term rehabilitation post hospital admission from 05/17/15-05/19/15 with right lower extremity critical limb ischemia. She underwent successful arthrectomy and drug coated balloon angioplasty of right external iliac and right common femoral, right superficial femoral artery.  She has PMH of systemic sclerosis, HTN, HLD, scleroderma. she is seen in her room today with her daughter present. She complaints of pain to her leg. No other concerns.   Review of Systems:  Constitutional: Negative for fever, chills, diaphoresis.  HENT: Negative for headache, congestion, nasal discharge Eyes: Negative for blurred vision, double vision and discharge.  Respiratory: Negative for cough, shortness of breath and wheezing.   Cardiovascular: Negative for chest pain, palpitations  Gastrointestinal: Negative for heartburn, nausea, vomiting, abdominal pain. Had bowel movement 2 days back Genitourinary: Negative for dysuria Musculoskeletal: Negative for back pain Skin: Negative for itching, rash.  Neurological: Negative for dizziness Psychiatric/Behavioral: Negative for depression   Past Medical History  Diagnosis Date  . Hypertension   . GERD (gastroesophageal reflux disease)   . Lupus (HCC)     skin, scleraderma  . Scleroderma (HCC)   . Arthritis     scleroderma  . Peripheral vascular disease (HCC)   . PAD (peripheral artery disease) (HCC)   . Heart murmur    Past Surgical History  Procedure Laterality Date  . Abdominal hysterectomy    . Coronary angioplasty      Dr Jenne CampusMcQueen  . Sympathectomy Right 01/27/2015    Procedure: RIGHT RADICAL ARTERY SYMPATHECTOMY, RIGHT ULNAR ARTERY SYMPATHECTOMY,  DIGITAL SYMPATHECTOMY RIGHT INDEX, MIDDLE, RING SMALL;  Surgeon: Cindee SaltGary Kuzma, MD;  Location: [facility];  Service: Orthopedics;  Laterality: Right;  . Amputation Right 01/27/2015    Procedure: AMPUTATION DISTAL TIP RIGHT INDEX FINGER;  Surgeon: Cindee SaltGary Kuzma, MD;  Location: [facility];  Service: Orthopedics;  Laterality: Right;  . Peripheral vascular catheterization N/A 02/15/2015    Procedure: Lower Extremity Angiography;  Surgeon: Yates DecampJay Ganji, MD;  [facility];  Service: Cardiovascular;  Laterality: N/A;  . Peripheral vascular catheterization Left 04/12/2015    Procedure: Lower Extremity Angiography;  Surgeon: Yates DecampJay Ganji, MD;  [facility];  Service: Cardiovascular;  Laterality: Left;  . Peripheral vascular catheterization Left 04/12/2015    Procedure: Peripheral Vascular Atherectomy;  Surgeon: Yates DecampJay Ganji, MD;  [facility];  Service: Cardiovascular;  Laterality: Left;  sfa     . Amputation Left 04/20/2015    Procedure: Left Below Knee Amputation;  Surgeon: Nadara MustardMarcus Duda V, MD;  [facility];  Service: Orthopedics;  Laterality: Left;  . Peripheral arterial stent graft Left 05/17/2015  . Peripheral vascular catheterization N/A 05/17/2015    Procedure: Lower Extremity Intervention;  Surgeon: Yates DecampJay Ganji, MD;  [facility];  Service: Cardiovascular;  Laterality: N/A;   Social History:   reports that she quit smoking about 5 months ago. Her smoking use included Cigarettes. She has a 15 pack-year smoking history. She has never used smokeless tobacco. She reports that she drinks alcohol. She reports that she does not use illicit drugs.  Family History  Problem Relation Age  of Onset  . Diabetes Sister   . Diabetes Brother     Medications:   Medication List       This list is accurate as of: 05/23/15 11:56 AM.  Always use your most recent med list.               acetaminophen 325 MG tablet  Commonly known as:   TYLENOL  Take 2 tablets (650 mg total) by mouth every 6 (six) hours as needed for mild pain (or Fever >/= 101).     amLODipine-benazepril 5-20 MG capsule  Commonly known as:  LOTREL  Take 1 capsule by mouth daily.     aspirin 81 MG EC tablet  Take 1 tablet (81 mg total) by mouth daily.     cilostazol 50 MG tablet  Commonly known as:  PLETAL  Take 50 mg by mouth daily.     clopidogrel 75 MG tablet  Commonly known as:  PLAVIX  Take 1 tablet (75 mg total) by mouth daily.     LIVALO 2 MG Tabs  Generic drug:  Pitavastatin Calcium  Take 2 mg by mouth 2 (two) times daily.     methocarbamol 500 MG tablet  Commonly known as:  ROBAXIN  Take 1 tablet (500 mg total) by mouth every 6 (six) hours as needed for muscle spasms.     morphine 15 MG 12 hr tablet  Commonly known as:  MS CONTIN  Take 1 tablet (15 mg total) by mouth every 12 (twelve) hours.     ondansetron 4 MG disintegrating tablet  Commonly known as:  ZOFRAN ODT  Take 1 tablet (4 mg total) by mouth every 8 (eight) hours as needed for nausea or vomiting.     oxyCODONE 5 MG immediate release tablet  Commonly known as:  ROXICODONE  Take 1-2 tablets (5-10 mg total) by mouth every 4 (four) hours as needed for severe pain.     polyethylene glycol packet  Commonly known as:  MIRALAX / GLYCOLAX  Take 17 g by mouth daily as needed for moderate constipation.     pregabalin 50 MG capsule  Commonly known as:  LYRICA  Take 1 capsule (50 mg total) by mouth 3 (three) times daily.         Physical Exam: Filed Vitals:   05/23/15 1156  BP: 111/60  Pulse: 84  Temp: 98.9 F (37.2 C)  Resp: 18  SpO2: 93%     General- elderly female, thin built, in no acute distress Head- normocephalic, atraumatic Nose- no maxillary or frontal sinus tenderness, no nasal discharge Throat- moist mucus membrane Eyes- PERRLA, EOMI, no pallor, no icterus, no discharge, normal conjunctiva, normal sclera Neck- no cervical  lymphadenopathy Cardiovascular- irregular heart rate, absent dorsalis pedis and good radial pulses, trace right foot and leg edema Respiratory- bilateral clear to auscultation, no wheeze, no rhonchi, no crackles, no use of accessory muscles, on o2 Abdomen- bowel sounds present, soft, non tender Musculoskeletal- left BKA, able to move all other extremities, right foot has erythema with mild swelling, right 2nd, 3rd and 4th toe ischemic with dry gangrene changes, right foot has purplish discoloration, right foot is cold to touch, Right lower leg normal temperature, left BKA  Neurological- no focal deficit, alert and oriented to person, place and time Skin- warm and dry, ecchymoses and bruising in her groin area and suprapubic region post surgery Psychiatry- normal mood and affect      Labs reviewed: Basic Metabolic Panel:  Recent Labs  04/18/15 0425 04/19/15 1947 04/21/15 0530  NA 134* 133* 133*  K 3.9 4.2 3.9  CL 95* 94* 97*  CO2 30 32 30  GLUCOSE 106* 137* 101*  BUN CREATININE 0.48 0.67 0.42*  CALCIUM 8.7* 8.3* 8.2*   Liver Function Tests:  Recent Labs  04/13/15 1515 04/19/15 1947  AST 23 20  ALT 10* 11*  ALKPHOS 52 53  BILITOT 0.6 0.6  PROT 5.6* 6.1*  ALBUMIN 2.7* 2.6*    Recent Labs  04/13/15 1515  LIPASE 13*   No results for input(s): AMMONIA in the last 8760 hours. CBC:  Recent Labs  12/08/14 0755  04/19/15 1947 04/21/15 0530 04/21/15 1452 04/22/15 0412  WBC 5.6  < > 6.5 4.9 6.1  --   NEUTROABS 3.5  --  4.7  --   --   --   HGB 12.7  < > 8.8* 7.3* 9.5* 9.2*  HCT 39.6  < > 28.4* 23.2* 30.6* 28.6*  MCV 94.3  < > 94.4 95.9 93.9  --   PLT 135*  < > 220 187 204  --   < > = values in this interval not displayed.   Assessment/Plan  Physical deconditioning Will have her work with physical therapy and occupational therapy team to help with gait training and muscle strengthening exercises.fall precautions. Skin care. Encourage to be out of bed.    Severe PAD S/p left BKA and now s/p right lower extremity arthrectomy and angioplasty. Change her oxycodone to 5 mg 1-2 tab q3h prn from q4h prn and monitor her pain. Continue ms contin 15 mg bid and prn robaxin. Continue wound care. Has f/u with orthopedics and cardiology. Continue pletal, plavix and aspirin  Blood loss anemia Post op, monitor cbc  Hyponatremia Monitor BMP  Protein calorie malnutrition Dietary consult, monitor po intake and weight  Unsteady gait Post left BKA. Will need to work with therapy team for gait training and strengthening exercise. High fall risk. Fall precautions  HTN Stable bp reading, monitor BP. Continue amlodipine-benazepril 5-20 mg daily  Constipation Continue miralax daily as needed for now and hydration encouraged   Goals of care: short term rehabilitation   Labs/tests ordered: cbc with diff, cmp 05/24/15. To f/u with orthopedics and cardiology  Family/ staff Communication: reviewed care plan with patient and nursing supervisor    Oneal Grout, MD  [facility] [phone] (Monday-Friday 8 am - 5 pm) [phone] (afterhours)

## 2015-05-24 ENCOUNTER — Inpatient Hospital Stay (HOSPITAL_COMMUNITY): Payer: Medicare Other

## 2015-05-24 ENCOUNTER — Encounter (HOSPITAL_COMMUNITY): Payer: Self-pay | Admitting: Emergency Medicine

## 2015-05-24 ENCOUNTER — Inpatient Hospital Stay (HOSPITAL_COMMUNITY)
Admission: EM | Admit: 2015-05-24 | Discharge: 2015-05-25 | DRG: 811 | Disposition: A | Discharge status: Skilled Nursing Facility | Payer: Medicare Other | Attending: Internal Medicine | Admitting: Internal Medicine

## 2015-05-24 DIAGNOSIS — I10 Essential (primary) hypertension: Secondary | ICD-10-CM | POA: Diagnosis present

## 2015-05-24 DIAGNOSIS — Z9861 Coronary angioplasty status: Secondary | ICD-10-CM

## 2015-05-24 DIAGNOSIS — E785 Hyperlipidemia, unspecified: Secondary | ICD-10-CM | POA: Diagnosis present

## 2015-05-24 DIAGNOSIS — Z79899 Other long term (current) drug therapy: Secondary | ICD-10-CM

## 2015-05-24 DIAGNOSIS — I739 Peripheral vascular disease, unspecified: Secondary | ICD-10-CM | POA: Diagnosis present

## 2015-05-24 DIAGNOSIS — K219 Gastro-esophageal reflux disease without esophagitis: Secondary | ICD-10-CM | POA: Diagnosis not present

## 2015-05-24 DIAGNOSIS — I5032 Chronic diastolic (congestive) heart failure: Secondary | ICD-10-CM | POA: Diagnosis present

## 2015-05-24 DIAGNOSIS — Z87891 Personal history of nicotine dependence: Secondary | ICD-10-CM

## 2015-05-24 DIAGNOSIS — E43 Unspecified severe protein-calorie malnutrition: Secondary | ICD-10-CM | POA: Diagnosis present

## 2015-05-24 DIAGNOSIS — M349 Systemic sclerosis, unspecified: Secondary | ICD-10-CM | POA: Diagnosis present

## 2015-05-24 DIAGNOSIS — D649 Anemia, unspecified: Secondary | ICD-10-CM | POA: Diagnosis present

## 2015-05-24 DIAGNOSIS — M1990 Unspecified osteoarthritis, unspecified site: Secondary | ICD-10-CM | POA: Diagnosis not present

## 2015-05-24 DIAGNOSIS — Z9862 Peripheral vascular angioplasty status: Secondary | ICD-10-CM

## 2015-05-24 DIAGNOSIS — Z66 Do not resuscitate: Secondary | ICD-10-CM | POA: Diagnosis present

## 2015-05-24 DIAGNOSIS — Z7902 Long term (current) use of antithrombotics/antiplatelets: Secondary | ICD-10-CM | POA: Diagnosis not present

## 2015-05-24 DIAGNOSIS — Z681 Body mass index (BMI) 19 or less, adult: Secondary | ICD-10-CM

## 2015-05-24 DIAGNOSIS — Z89512 Acquired absence of left leg below knee: Secondary | ICD-10-CM | POA: Diagnosis not present

## 2015-05-24 DIAGNOSIS — I110 Hypertensive heart disease with heart failure: Secondary | ICD-10-CM | POA: Diagnosis present

## 2015-05-24 DIAGNOSIS — Z7982 Long term (current) use of aspirin: Secondary | ICD-10-CM

## 2015-05-24 DIAGNOSIS — D500 Iron deficiency anemia secondary to blood loss (chronic): Principal | ICD-10-CM | POA: Diagnosis present

## 2015-05-24 DIAGNOSIS — R42 Dizziness and giddiness: Secondary | ICD-10-CM | POA: Diagnosis present

## 2015-05-24 DIAGNOSIS — D62 Acute posthemorrhagic anemia: Secondary | ICD-10-CM | POA: Diagnosis not present

## 2015-05-24 HISTORY — DX: Chronic diastolic (congestive) heart failure: I50.32

## 2015-05-24 LAB — COMPREHENSIVE METABOLIC PANEL
ALBUMIN: 2.9 g/dL — AB (ref 3.5–5.0)
ALK PHOS: 68 U/L (ref 38–126)
ALT: 18 U/L (ref 14–54)
ANION GAP: 10 (ref 5–15)
AST: 40 U/L (ref 15–41)
BILIRUBIN TOTAL: 0.9 mg/dL (ref 0.3–1.2)
BUN: 6 mg/dL (ref 6–20)
CALCIUM: 8.7 mg/dL — AB (ref 8.9–10.3)
CO2: 30 mmol/L (ref 22–32)
Chloride: 97 mmol/L — ABNORMAL LOW (ref 101–111)
Creatinine, Ser: 0.43 mg/dL — ABNORMAL LOW (ref 0.44–1.00)
GFR calc non Af Amer: >60 mL/min (ref >60)
GLUCOSE: 96 mg/dL (ref 65–99)
POTASSIUM: 3.6 mmol/L (ref 3.5–5.1)
SODIUM: 137 mmol/L (ref 135–145)
TOTAL PROTEIN: 6.7 g/dL (ref 6.5–8.1)

## 2015-05-24 LAB — BASIC METABOLIC PANEL
BUN: 7 mg/dL (ref 4–21)
CREATININE: 0.3 mg/dL — AB (ref <=1.1)
GLUCOSE: 89 mg/dL
POTASSIUM: 3.5 mmol/L (ref 3.4–5.3)
SODIUM: 136 mmol/L — AB (ref 137–147)

## 2015-05-24 LAB — CBC WITH DIFFERENTIAL/PLATELET
BASOS PCT: 0 %
Basophils Absolute: 0 10*3/uL (ref 0.0–0.1)
EOS ABS: 0.1 10*3/uL (ref 0.0–0.7)
Eosinophils Relative: 1 %
HCT: 21.9 % — ABNORMAL LOW (ref 36.0–46.0)
Hemoglobin: 6.6 g/dL — CL (ref 12.0–15.0)
LYMPHS ABS: 0.9 10*3/uL (ref 0.7–4.0)
Lymphocytes Relative: 12 %
MCH: 29.3 pg (ref 26.0–34.0)
MCHC: 30.1 g/dL (ref 30.0–36.0)
MCV: 97.3 fL (ref 78.0–100.0)
MONO ABS: 0.6 10*3/uL (ref 0.1–1.0)
MONOS PCT: 9 %
Neutro Abs: 5.6 10*3/uL (ref 1.7–7.7)
Neutrophils Relative %: 78 %
Platelets: 186 10*3/uL (ref 150–400)
RBC: 2.25 MIL/uL — ABNORMAL LOW (ref 3.87–5.11)
RDW: 17.3 % — AB (ref 11.5–15.5)
WBC: 7.2 10*3/uL (ref 4.0–10.5)

## 2015-05-24 LAB — HEPATIC FUNCTION PANEL
ALT: 15 U/L (ref 7–35)
AST: 34 U/L (ref 13–35)
Alkaline Phosphatase: 65 U/L (ref 25–125)
BILIRUBIN, TOTAL: 0.8 mg/dL

## 2015-05-24 LAB — IRON AND TIBC
Iron: 25 ug/dL — ABNORMAL LOW (ref 28–170)
SATURATION RATIOS: 8 % — AB (ref 10.4–31.8)
TIBC: 323 ug/dL (ref 250–450)
UIBC: 298 ug/dL

## 2015-05-24 LAB — CBC AND DIFFERENTIAL
HEMATOCRIT: 20 % — AB (ref 36–46)
Hemoglobin: 6.1 g/dL — AB (ref 12.0–16.0)
Platelets: 171 10*3/uL (ref 150–399)
WBC: 7.1 10^3/mL

## 2015-05-24 LAB — LACTATE DEHYDROGENASE: LDH: 257 U/L — AB (ref 98–192)

## 2015-05-24 LAB — VITAMIN B12: Vitamin B-12: 458 pg/mL (ref 180–914)

## 2015-05-24 LAB — DIRECT ANTIGLOBULIN TEST (NOT AT ARMC)
DAT, COMPLEMENT: NEGATIVE
DAT, IgG: NEGATIVE

## 2015-05-24 LAB — CBC
HEMATOCRIT: 21.6 % — AB (ref 36.0–46.0)
HEMOGLOBIN: 6.5 g/dL — AB (ref 12.0–15.0)
MCH: 29.4 pg (ref 26.0–34.0)
MCHC: 30.1 g/dL (ref 30.0–36.0)
MCV: 97.7 fL (ref 78.0–100.0)
Platelets: 184 10*3/uL (ref 150–400)
RBC: 2.21 MIL/uL — ABNORMAL LOW (ref 3.87–5.11)
RDW: 17.1 % — ABNORMAL HIGH (ref 11.5–15.5)
WBC: 6.5 10*3/uL (ref 4.0–10.5)

## 2015-05-24 LAB — POC OCCULT BLOOD, ED: Fecal Occult Bld: NEGATIVE

## 2015-05-24 LAB — RETICULOCYTES
RBC.: 2.48 MIL/uL — AB (ref 3.87–5.11)
RETIC CT PCT: 7.7 % — AB (ref 0.4–3.1)
Retic Count, Absolute: 191 10*3/uL — ABNORMAL HIGH (ref 19.0–186.0)

## 2015-05-24 LAB — FERRITIN: FERRITIN: 107 ng/mL (ref 11–307)

## 2015-05-24 LAB — I-STAT TROPONIN, ED: TROPONIN I, POC: 0.01 ng/mL (ref 0.00–0.08)

## 2015-05-24 LAB — FOLATE: Folate: 12.5 ng/mL (ref >5.9)

## 2015-05-24 LAB — SAVE SMEAR

## 2015-05-24 LAB — PREPARE RBC (CROSSMATCH)

## 2015-05-24 MED ORDER — AMLODIPINE BESYLATE 5 MG PO TABS
5.0000 mg | ORAL_TABLET | Freq: Every day | ORAL | Status: DC
  Administered 2015-05-25: 5 mg via ORAL
  Filled 2015-05-24: qty 1

## 2015-05-24 MED ORDER — ONDANSETRON 4 MG PO TBDP: 4.0000 mg | ORAL_TABLET | Freq: Three times a day (TID) | ORAL | Status: DC | PRN

## 2015-05-24 MED ORDER — OXYCODONE HCL 5 MG PO TABS
5.0000 mg | ORAL_TABLET | ORAL | Status: DC | PRN
  Administered 2015-05-25: 5 mg via ORAL
  Administered 2015-05-25: 10 mg via ORAL
  Filled 2015-05-24: qty 2
  Filled 2015-05-24: qty 1

## 2015-05-24 MED ORDER — MORPHINE SULFATE ER 15 MG PO TBCR
15.0000 mg | EXTENDED_RELEASE_TABLET | Freq: Two times a day (BID) | ORAL | Status: DC
  Administered 2015-05-24 – 2015-05-25 (×2): 15 mg via ORAL
  Filled 2015-05-24 (×2): qty 1

## 2015-05-24 MED ORDER — PREGABALIN 25 MG PO CAPS
50.0000 mg | ORAL_CAPSULE | Freq: Three times a day (TID) | ORAL | Status: DC
  Administered 2015-05-24 – 2015-05-25 (×3): 50 mg via ORAL
  Filled 2015-05-24 (×3): qty 2

## 2015-05-24 MED ORDER — IOHEXOL 300 MG/ML  SOLN: 100.0000 mL | Freq: Once | INTRAMUSCULAR | Status: DC | PRN

## 2015-05-24 MED ORDER — SODIUM CHLORIDE 0.9 % IJ SOLN
3.0000 mL | Freq: Two times a day (BID) | INTRAMUSCULAR | Status: DC
  Administered 2015-05-24: 3 mL via INTRAVENOUS

## 2015-05-24 MED ORDER — PRAVASTATIN SODIUM 40 MG PO TABS: 40.0000 mg | ORAL_TABLET | Freq: Every day | ORAL | Status: DC

## 2015-05-24 MED ORDER — SODIUM CHLORIDE 0.9 % IV SOLN
INTRAVENOUS | Status: DC
  Administered 2015-05-24: 23:00:00 via INTRAVENOUS

## 2015-05-24 MED ORDER — ENSURE ENLIVE PO LIQD
237.0000 mL | Freq: Two times a day (BID) | ORAL | Status: DC
  Administered 2015-05-25: 237 mL via ORAL

## 2015-05-24 MED ORDER — ASPIRIN EC 81 MG PO TBEC
81.0000 mg | DELAYED_RELEASE_TABLET | Freq: Every day | ORAL | Status: DC
  Administered 2015-05-25: 81 mg via ORAL
  Filled 2015-05-24: qty 1

## 2015-05-24 MED ORDER — SODIUM CHLORIDE 0.9 % IV SOLN
10.0000 mL/h | Freq: Once | INTRAVENOUS | Status: AC
  Administered 2015-05-24: 10 mL/h via INTRAVENOUS

## 2015-05-24 MED ORDER — SODIUM CHLORIDE 0.9 % IV BOLUS (SEPSIS)
1000.0000 mL | Freq: Once | INTRAVENOUS | Status: AC
  Administered 2015-05-24: 1000 mL via INTRAVENOUS

## 2015-05-24 MED ORDER — ACETAMINOPHEN 325 MG PO TABS: 650.0000 mg | ORAL_TABLET | Freq: Four times a day (QID) | ORAL | Status: DC | PRN

## 2015-05-24 MED ORDER — PANTOPRAZOLE SODIUM 40 MG PO TBEC
40.0000 mg | DELAYED_RELEASE_TABLET | Freq: Every day | ORAL | Status: DC
  Administered 2015-05-24 – 2015-05-25 (×2): 40 mg via ORAL
  Filled 2015-05-24 (×2): qty 1

## 2015-05-24 MED ORDER — CILOSTAZOL 50 MG PO TABS
50.0000 mg | ORAL_TABLET | Freq: Every day | ORAL | Status: DC
  Administered 2015-05-25: 50 mg via ORAL
  Filled 2015-05-24: qty 1

## 2015-05-24 MED ORDER — MORPHINE SULFATE (PF) 2 MG/ML IV SOLN: 1.0000 mg | INTRAVENOUS | Status: DC | PRN

## 2015-05-24 MED ORDER — CLOPIDOGREL BISULFATE 75 MG PO TABS
75.0000 mg | ORAL_TABLET | Freq: Every day | ORAL | Status: DC
  Administered 2015-05-25: 75 mg via ORAL
  Filled 2015-05-24: qty 1

## 2015-05-24 MED ORDER — AMLODIPINE BESY-BENAZEPRIL HCL 5-20 MG PO CAPS: 1.0000 | ORAL_CAPSULE | Freq: Every day | ORAL | Status: DC

## 2015-05-24 MED ORDER — POLYETHYLENE GLYCOL 3350 17 G PO PACK
17.0000 g | PACK | Freq: Every day | ORAL | Status: DC | PRN
  Filled 2015-05-24: qty 1

## 2015-05-24 MED ORDER — BENAZEPRIL HCL 20 MG PO TABS
20.0000 mg | ORAL_TABLET | Freq: Every day | ORAL | Status: DC
  Administered 2015-05-25: 20 mg via ORAL
  Filled 2015-05-24: qty 1

## 2015-05-24 MED ORDER — IOHEXOL 300 MG/ML  SOLN
25.0000 mL | INTRAMUSCULAR | Status: AC
  Administered 2015-05-24: 25 mL via ORAL

## 2015-05-24 MED ORDER — ACETAMINOPHEN 650 MG RE SUPP: 650.0000 mg | Freq: Four times a day (QID) | RECTAL | Status: DC | PRN

## 2015-05-24 MED ORDER — HYDROXYZINE HCL 50 MG/ML IM SOLN
25.0000 mg | Freq: Four times a day (QID) | INTRAMUSCULAR | Status: DC | PRN
  Filled 2015-05-24: qty 0.5

## 2015-05-24 MED ORDER — MORPHINE SULFATE (PF) 4 MG/ML IV SOLN
4.0000 mg | Freq: Once | INTRAVENOUS | Status: AC
  Administered 2015-05-24: 4 mg via INTRAVENOUS
  Filled 2015-05-24: qty 1

## 2015-05-24 NOTE — ED Notes (Signed)
Spoke to BayardNiu and explained that pt had been given a meal tray and was eating. Dr. Clyde LundborgNiu said to just wait 3 hours then get the scan. CT made aware and floor made aware during report.

## 2015-05-24 NOTE — ED Notes (Addendum)
Pt from [facility] sent over via [facility] with 6.1 Hgb this am.  Pt denies CP, SOB, bloody or dark stools, or any other complaints.  NAD, alert to baseline.

## 2015-05-24 NOTE — ED Notes (Signed)
Lab at the bedside 

## 2015-05-24 NOTE — ED Provider Notes (Signed)
CSN: [id number]     Arrival date & time 05/24/15  1613 History   First MD Initiated Contact with Patient 05/24/15 1622     Chief Complaint  Patient presents with  . Abnormal Lab     (Consider location/radiation/quality/duration/timing/severity/associated sxs/prior Treatment) The history is provided by the patient.  Shannon Rice is a 81 y.o. female hx of HTN, lupus, PAD here presenting with anemia. She resides at [facility]. She states that she feels a little dizzy today when she stood up. Denies any chest pain or shortness of breath or abdominal pain. Denies any blood in her stool but noticed more dark stool. Patient had hemoglobin checked today was 6.1, and was 9 a week ago. Denies any fevers or chills    Past Medical History  Diagnosis Date  . Hypertension   . GERD (gastroesophageal reflux disease)   . Lupus (HCC)     skin, scleraderma  . Scleroderma (HCC)   . Arthritis     scleroderma  . Peripheral vascular disease (HCC)   . PAD (peripheral artery disease) (HCC)   . Heart murmur    Past Surgical History  Procedure Laterality Date  . Abdominal hysterectomy    . Coronary angioplasty      Dr [name]  . Sympathectomy Right 01/27/2015    Procedure: RIGHT RADICAL ARTERY SYMPATHECTOMY, RIGHT ULNAR ARTERY SYMPATHECTOMY, DIGITAL SYMPATHECTOMY RIGHT INDEX, MIDDLE, RING SMALL;  Surgeon: [name], MD;  Location: [facility];  Service: Orthopedics;  Laterality: Right;  . Amputation Right 01/27/2015    Procedure: AMPUTATION DISTAL TIP RIGHT INDEX FINGER;  Surgeon: [name], MD;  Location: [facility];  Service: Orthopedics;  Laterality: Right;  . Peripheral vascular catheterization N/A 02/15/2015    Procedure: Lower Extremity Angiography;  Surgeon: [name], MD;  Location: [facility] INVASIVE CV LAB;  Service: Cardiovascular;  Laterality: N/A;  . Peripheral vascular catheterization Left 04/12/2015    Procedure: Lower Extremity Angiography;  Surgeon: [name], MD;  Location: [facility] INVASIVE CV LAB;  Service: Cardiovascular;  Laterality: Left;  . Peripheral vascular catheterization Left 04/12/2015    Procedure: Peripheral Vascular Atherectomy;  Surgeon: [name], MD;  Location: [facility] INVASIVE CV LAB;  Service: Cardiovascular;  Laterality: Left;  sfa     . Amputation Left 04/20/2015    Procedure: Left Below Knee Amputation;  Surgeon: Nadara Mustard, MD;  Location: [facility] OR;  Service: Orthopedics;  Laterality: Left;  . Peripheral arterial stent graft Left 05/17/2015  . Peripheral vascular catheterization N/A 05/17/2015    Procedure: Lower Extremity Intervention;  Surgeon: [name], MD;  Location: [facility] INVASIVE CV LAB;  Service: Cardiovascular;  Laterality: N/A;   Family History  Problem Relation Age of Onset  . Diabetes Sister   . Diabetes Brother    Social History  Substance Use Topics  . Smoking status: Former Smoker -- 0.25 packs/day for 60 years    Types: Cigarettes    Quit date: 12/12/2014  . Smokeless tobacco: Never Used  . Alcohol Use: Yes     Comment: glass wine on special occasions   OB History    No data available     Review of Systems  Neurological: Positive for dizziness and weakness.  All other systems reviewed and are negative.     Allergies  Review of patient's allergies indicates no known allergies.  Home Medications   Prior to Admission medications   Medication Sig Start Date End Date Taking? Authorizing Provider  acetaminophen (TYLENOL) 325  MG tablet Take 2 tablets (650 mg total) by mouth every 6 (six) hours as needed for mild pain (or Fever >/= 101). 04/22/15  Yes Dawood S Elgergawy, MD  amLODipine-benazepril (LOTREL) 5-20 MG per capsule Take 1 capsule by mouth daily.   Yes Historical Provider, MD  aspirin EC 81 MG EC tablet Take 1 tablet (81 mg total) by mouth daily. 04/13/15  Yes [name], NP  cilostazol (PLETAL) 50 MG tablet Take 50 mg by mouth daily. 02/25/15  Yes Historical Provider, MD  clopidogrel  (PLAVIX) 75 MG tablet Take 1 tablet (75 mg total) by mouth daily. 04/22/15  Yes Starleen Arms, MD  morphine (MS CONTIN) 15 MG 12 hr tablet Take 1 tablet (15 mg total) by mouth every 12 (twelve) hours. 04/29/15  Yes Monica Carter, DO  ondansetron (ZOFRAN ODT) 4 MG disintegrating tablet Take 1 tablet (4 mg total) by mouth every 8 (eight) hours as needed for nausea or vomiting. 04/13/15  Yes [name], NP  oxyCODONE (ROXICODONE) 5 MG immediate release tablet Take 1-2 tablets (5-10 mg total) by mouth every 4 (four) hours as needed for severe pain. 05/05/15  Yes Tiffany L Reed, DO  Pitavastatin Calcium (LIVALO) 2 MG TABS Take 2 mg by mouth 2 (two) times daily.   Yes Historical Provider, MD  polyethylene glycol (MIRALAX / GLYCOLAX) packet Take 17 g by mouth daily as needed for moderate constipation. 04/22/15  Yes Starleen Arms, MD  pregabalin (LYRICA) 50 MG capsule Take 1 capsule (50 mg total) by mouth 3 (three) times daily. 04/27/15  Yes Sharon Seller, NP  methocarbamol (ROBAXIN) 500 MG tablet Take 1 tablet (500 mg total) by mouth every 6 (six) hours as needed for muscle spasms. Patient not taking: Reported on 05/24/2015 04/22/15   Leana Roe Elgergawy, MD   BP 161/57 mmHg  Pulse 106  Temp(Src) 98.6 F (37 C) (Oral)  Resp 16  SpO2 97% Physical Exam  Constitutional: She is oriented to person, place, and time.  Chronically ill, pale   HENT:  Head: Normocephalic.  Mouth/Throat: Oropharynx is clear and moist.  Eyes: EOM are normal. Pupils are equal, round, and reactive to light.  Conjunctiva pale   Neck: Normal range of motion. Neck supple.  Cardiovascular: Normal rate, regular rhythm and normal heart sounds.   Pulmonary/Chest: Effort normal and breath sounds normal. No respiratory distress. She has no wheezes. She has no rales.  Abdominal: Soft. Bowel sounds are normal. She exhibits no distension. There is no tenderness. There is no rebound.  Musculoskeletal: Normal range of  motion.  L BKA (chronic)   Neurological: She is alert and oriented to person, place, and time. No cranial nerve deficit. Coordination normal.  Skin: Skin is warm and dry.  Psychiatric: She has a normal mood and affect. Her behavior is normal. Judgment and thought content normal.  Nursing note and vitals reviewed.   ED Course  Procedures (including critical care time) CRITICAL CARE Performed by: Silverio Lay, DAVID   Total critical care time: 30  minutes  Critical care time was exclusive of separately billable procedures and treating other patients.  Critical care was necessary to treat or prevent imminent or life-threatening deterioration.  Critical care was time spent personally by me on the following activities: development of treatment plan with patient and/or surrogate as well as nursing, discussions with consultants, evaluation of patient's response to treatment, examination of patient, obtaining history from patient or surrogate, ordering and performing treatments and interventions, ordering and review  of laboratory studies, ordering and review of radiographic studies, pulse oximetry and re-evaluation of patient's condition.   Labs Review Labs Reviewed  CBC WITH DIFFERENTIAL/PLATELET - Abnormal; Notable for the following:    RBC 2.25 (*)    Hemoglobin 6.6 (*)    HCT 21.9 (*)    RDW 17.3 (*)    All other components within normal limits  COMPREHENSIVE METABOLIC PANEL - Abnormal; Notable for the following:    Chloride 97 (*)    Creatinine, Ser 0.43 (*)    Calcium 8.7 (*)    Albumin 2.9 (*)    All other components within normal limits  LACTATE DEHYDROGENASE  HAPTOGLOBIN  SAVE SMEAR  PATHOLOGIST SMEAR REVIEW  VITAMIN B12  FOLATE  IRON AND TIBC  FERRITIN  RETICULOCYTES  POC OCCULT BLOOD, ED  I-STAT TROPOININ, ED  TYPE AND SCREEN  PREPARE RBC (CROSSMATCH)  DIRECT ANTIGLOBULIN TEST (NOT [facility])    Imaging Review No results found. I have personally reviewed and evaluated  these images and lab results as part of my medical decision-making.   EKG Interpretation   Date/Time:  Tuesday May 24 2015 17:36:51 EST Ventricular Rate:  117 PR Interval:  131 QRS Duration: 87 QT Interval:  349 QTC Calculation: 487 R Axis:   -39 Text Interpretation:  Sinus tachycardia Multiple premature complexes, vent  & supraven Biatrial enlargement Left axis deviation Abnormal R-wave  progression, early transition Borderline repolarization abnormality  tachycardia new since previous  Confirmed by YAO  MD, DAVID ([id number]) on  05/24/2015 5:51:03 PM      MDM   Final diagnoses:  None    Cyndia SkeetersJohnetta M Mcclafferty is a 81 y.o. female here with weakness, dizziness. Likely symptomatic anemia. Will check CBC, guiac, type.   7:29 PM Guiac neg. Hg 6.6. Ordered 2 U PRBC. Hospitalist wants to add on labs to see why she is anemic. Will admit to tele     Richardean Canalavid H Yao, MD 05/24/15 [phone]

## 2015-05-24 NOTE — ED Notes (Signed)
Lab called to draw specimens prior to blood administration.

## 2015-05-24 NOTE — ED Notes (Signed)
Report attempted x 1

## 2015-05-24 NOTE — Progress Notes (Signed)
PATIENT ARRIVED TO UNIT 2W FROM E.D. VIA STRETCHER. TRANSFERRED TO BED. TELE APPLIED. VITALS OBTAINED. ASSESSMENT COMPLETED. PATIENT ORIENTED TO UNIT AND EQUIPMENT. INSTRUCTED TO CALL FOR ASSISTANCE WHEN NEEDED.

## 2015-05-24 NOTE — Progress Notes (Signed)
PATIENT REFUSING TO DRINK CONTRAST SOLUTION FOR CT PREP. CT AND DR. Clyde LundborgNIU NOTIFIED.

## 2015-05-24 NOTE — H&P (Signed)
[facility] History and Physical  Shannon SkeetersJohnetta M Jurczyk ZOX:[id number] DOB: [birth year] DOA: 05/24/2015  Referring physician: ED physician PCP: Ralene OkMOREIRA,ROY, MD  Specialists:   Chief Complaint: Hemoglobin 6.1, dizziness and lightheadedness  HPI: Shannon Rice is a 81 y.o. female with PMH of severe PVD, s/p left BKA, hypertension, hyperlipidemia, GERD, lupus, scleroderma, arthritis, who presents with symptomatic anemia.  Patient was recently hospitalized from 11/8-11/10 because of severe PAD, critical limb ischemia and dry gangrene in R leg. She had a successful atherectomy and drug coated balloon angioplasty of the right external iliac and right common femoral and right superficial femoral artery by Dr. Jacinto HalimGanji. She is discharged to SNF on cilostazol, aspirin and Plavix. Patient had lab test in the nursing home, which showed hemoglobin 6.1. Patient has mild lightheadedness and dizziness. She does not have chest pain, shortness of breath, cough, fever, chills, dark stool, hemoptysis, hematochezia, hematemesis, hematuria. She denies abdominal pain, nausea or vomiting.  In ED, patient was found to have mild tenderness to palpation over left lower quadrant, hemoglobin dropped from 9.2 on 04/22/15 6.6, WBC 7.2, troponin negative, temperature normal, heart rate 92, electrolytes and renal function okay. Patient's admitted to inpatient for further eval and treatment.  Where does patient live?   SNF   Can patient participate in ADLs?   None Review of Systems:   General: no fevers, chills, no changes in body weight, has poor appetite, has fatigue HEENT: no blurry vision, hearing changes or sore throat Pulm: no dyspnea, coughing, wheezing CV: no chest pain, palpitations Abd: no nausea, vomiting, abdominal pain, diarrhea, constipation GU: no dysuria, burning on urination, increased urinary frequency, hematuria  Ext: no leg edema Neuro: no unilateral weakness, numbness, or tingling, no vision  change or hearing loss Skin: no rash MSK: No muscle spasm, no deformity, no limitation of range of movement in spin Heme: No easy bruising.  Travel history: No recent long distant travel.  Allergy: No Known Allergies  Past Medical History  Diagnosis Date  . Hypertension   . GERD (gastroesophageal reflux disease)   . Lupus (HCC)     skin, scleraderma  . Scleroderma (HCC)   . Arthritis     scleroderma  . Peripheral vascular disease (HCC)   . PAD (peripheral artery disease) (HCC)   . Heart murmur   . Chronic diastolic (congestive) heart failure [facility](HCC)     Past Surgical History  Procedure Laterality Date  . Abdominal hysterectomy    . Coronary angioplasty      Dr Jenne CampusMcQueen  . Sympathectomy Right 01/27/2015    Procedure: RIGHT RADICAL ARTERY SYMPATHECTOMY, RIGHT ULNAR ARTERY SYMPATHECTOMY, DIGITAL SYMPATHECTOMY RIGHT INDEX, MIDDLE, RING SMALL;  Surgeon: Cindee SaltGary Kuzma, MD;  Location: [facility];  Service: Orthopedics;  Laterality: Right;  . Amputation Right 01/27/2015    Procedure: AMPUTATION DISTAL TIP RIGHT INDEX FINGER;  Surgeon: Cindee SaltGary Kuzma, MD;  Location: [facility];  Service: Orthopedics;  Laterality: Right;  . Peripheral vascular catheterization N/A 02/15/2015    Procedure: Lower Extremity Angiography;  Surgeon: Yates DecampJay Ganji, MD;  [facility];  Service: Cardiovascular;  Laterality: N/A;  . Peripheral vascular catheterization Left 04/12/2015    Procedure: Lower Extremity Angiography;  Surgeon: Yates DecampJay Ganji, MD;  [facility];  Service: Cardiovascular;  Laterality: Left;  . Peripheral vascular catheterization Left 04/12/2015    Procedure: Peripheral Vascular Atherectomy;  Surgeon: Yates DecampJay Ganji, MD;  [facility];  Service: Cardiovascular;  Laterality: Left;  sfa     .  Amputation Left 04/20/2015    Procedure: Left Below Knee Amputation;  Surgeon: Nadara Mustard, MD;  Location[facility];  Service: Orthopedics;  Laterality: Left;   . Peripheral arterial stent graft Left 05/17/2015  . Peripheral vascular catheterization N/A 05/17/2015    Procedure: Lower Extremity Intervention;  Surgeon: [name], MD;  Location: [facility] INVASIVE CV LAB;  Service: Cardiovascular;  Laterality: N/A;    Social History:  reports that she quit smoking about 5 months ago. Her smoking use included Cigarettes. She has a 15 pack-year smoking history. She has never used smokeless tobacco. She reports that she drinks alcohol. She reports that she does not use illicit drugs.  Family History:  Family History  Problem Relation Age of Onset  . Diabetes Sister   . Diabetes Brother      Prior to Admission medications   Medication Sig Start Date End Date Taking? Authorizing Provider  acetaminophen (TYLENOL) 325 MG tablet Take 2 tablets (650 mg total) by mouth every 6 (six) hours as needed for mild pain (or Fever >/= 101). 04/22/15  Yes Dawood S Elgergawy, MD  amLODipine-benazepril (LOTREL) 5-20 MG per capsule Take 1 capsule by mouth daily.   Yes Historical Provider, MD  aspirin EC 81 MG EC tablet Take 1 tablet (81 mg total) by mouth daily. 04/13/15  Yes [name], NP  cilostazol (PLETAL) 50 MG tablet Take 50 mg by mouth daily. 02/25/15  Yes Historical Provider, MD  clopidogrel (PLAVIX) 75 MG tablet Take 1 tablet (75 mg total) by mouth daily. 04/22/15  Yes Starleen Arms, MD  morphine (MS CONTIN) 15 MG 12 hr tablet Take 1 tablet (15 mg total) by mouth every 12 (twelve) hours. 04/29/15  Yes Monica Carter, DO  ondansetron (ZOFRAN ODT) 4 MG disintegrating tablet Take 1 tablet (4 mg total) by mouth every 8 (eight) hours as needed for nausea or vomiting. 04/13/15  Yes [name], NP  oxyCODONE (ROXICODONE) 5 MG immediate release tablet Take 1-2 tablets (5-10 mg total) by mouth every 4 (four) hours as needed for severe pain. 05/05/15  Yes Tiffany L Reed, DO  Pitavastatin Calcium (LIVALO) 2 MG TABS Take 2 mg by mouth 2 (two) times daily.   Yes  Historical Provider, MD  polyethylene glycol (MIRALAX / GLYCOLAX) packet Take 17 g by mouth daily as needed for moderate constipation. 04/22/15  Yes Starleen Arms, MD  pregabalin (LYRICA) 50 MG capsule Take 1 capsule (50 mg total) by mouth 3 (three) times daily. 04/27/15  Yes Sharon Seller, NP  methocarbamol (ROBAXIN) 500 MG tablet Take 1 tablet (500 mg total) by mouth every 6 (six) hours as needed for muscle spasms. Patient not taking: Reported on 05/24/2015 04/22/15   Starleen Arms, MD    Physical Exam: Filed Vitals:   05/24/15 1745 05/24/15 1829 05/24/15 1917 05/24/15 2005  BP: 129/75 143/60 161/57 152/62  Pulse:  92 106 106  Temp:   98.6 F (37 C) 98.8 F (37.1 C)  TempSrc:   Oral Oral  Resp: SpO2:  96% 97% 96%   General: Not in acute distress HEENT:       Eyes: PERRL, EOMI, no scleral icterus.       ENT: No discharge from the ears and nose, no pharynx injection, no tonsillar enlargement.        Neck: No JVD, no bruit, no mass felt. Heme: No neck lymph node enlargement. Cardiac: S1/S2, RRR, No murmurs, No gallops or rubs.  Pulm: No rales, wheezing, rhonchi or rubs. Abd: Soft, nondistended, mild tenderness over LLQ, no rebound pain, no organomegaly, BS present. Ext: No pitting leg edema bilaterally. Faint DP/PT pulse in R leg. Has dry gangrene over all toes in the right foot.  Musculoskeletal: No joint deformities, No joint redness or warmth, no limitation of ROM in spin. Skin: No rashes.  Neuro: Alert, oriented X3, cranial nerves II-XII grossly intact, muscle strength 5/5 in all extremities, Psych: Patient is not psychotic, no suicidal or hemocidal ideation.  Labs on Admission:  Basic Metabolic Panel:  Recent Labs Lab 05/24/15 1750  NA 137  K 3.6  CL 97*  CO2 30  GLUCOSE 96  BUN 6  CREATININE 0.43*  CALCIUM 8.7*   Liver Function Tests:  Recent Labs Lab 05/24/15 1750  AST 40  ALT 18  ALKPHOS 68  BILITOT 0.9  PROT 6.7  ALBUMIN  2.9*   No results for input(s): LIPASE, AMYLASE in the last 168 hours. No results for input(s): AMMONIA in the last 168 hours. CBC:  Recent Labs Lab 05/24/15 1750  WBC 7.2  NEUTROABS 5.6  HGB 6.6*  HCT 21.9*  MCV 97.3  PLT 186   Cardiac Enzymes: No results for input(s): CKTOTAL, CKMB, CKMBINDEX, TROPONINI in the last 168 hours.  BNP (last 3 results) No results for input(s): BNP in the last 8760 hours.  ProBNP (last 3 results) No results for input(s): PROBNP in the last 8760 hours.  CBG: No results for input(s): GLUCAP in the last 168 hours.  Radiological Exams on Admission: No results found.  EKG: Independently reviewed. QTC 487, frequent PAC, LAD  Assessment/Plan Principal Problem:   Symptomatic anemia Active Problems:   Scleroderma (HCC)   Peripheral artery disease (HCC)   HTN (hypertension)   Protein-calorie malnutrition, severe (HCC)   HLD (hyperlipidemia)   GERD (gastroesophageal reflux disease)   Arthritis   Chronic diastolic (congestive) heart failure (HCC)  Symptomatic anemia: Etiology is not clear. Patient is on aspirin and Plavix, and has mild tenderness over left lower quadrant, need to rule out intra-abdominal bleeding. Her total bilirubin is normal, indicating less likely to have hemolysis, though cannot rule out this possibility completely.  -Admit to tele bed -will check LDH, haptoglobin,  direct Coombs test, peripheral smear, anemia panel, TSH -INR/PTT/type & screen -IV fluid: 1 L normal saline bolus, then 75 mL per hour -2 units of blood were ordered by ED -CT-abd/pelvis to r/o intra-abdominal bleeding  Hx of Scleroderma (HCC) and lupus: The patient has been followed up by dermatologist, Dr. Jorja Loa. Last seen at early of this year. stable. Not taking specific medications currently. -follow up with PCP and dermatologist  Severe peripheral artery disease [facility]): s/p of recent atherectomy and drug coated balloon angioplasty of the right external  iliac and right common femoral and right superficial femoral artery by Dr. Jacinto Halim. She is on aspirin, cilostazole and Plavix.   -Per her daughter, patient already had aspirin and Plavix today.  -I ordered to continue Cilostazole, aspirin and Plavix now. If CT-abd/pelvis shows intra-abdominal bleeding, may need to discuss with Dr. Denyse Dago to hold ASA and Plavix -pain control: MS Contin, oxycodone and lyrica  Chronic diastolic congestive heart failure: 2-D echo on 04/16/15 showed EF 60-65% with grade 2 diastolic dysfunction. Patient does not have any leg edema. CHF is compensated. -BNP  HTN: -Amlodipine  Protein-calorie malnutrition, severe (HCC): -start Ensure when able to eat  HLD: Last LDL was not on record -Pravastatin -Check FLP  GERD: -  Protonix   DVT ppx: SCD Code Status: DNR Family Communication:  Yes, patient's daughter and a granddaughter at bed side Disposition Plan: Admit to inpatient   Date of Service 05/24/2015    Lorretta Harp [facility] Pager [phone]  If 7PM-7AM, please contact night-coverage [email or web address] Password [facility] 05/24/2015, 8:19 PM

## 2015-05-24 NOTE — ED Notes (Signed)
Dinner Tray ordered @ [phone].

## 2015-05-24 NOTE — ED Notes (Signed)
Critical HGB 6.6 received from lab. EDP aware.

## 2015-05-24 NOTE — ED Notes (Signed)
Saline setup for blood at the bedside. Tech sent to get 1st unit.

## 2015-05-25 ENCOUNTER — Inpatient Hospital Stay (HOSPITAL_COMMUNITY): Payer: Medicare Other

## 2015-05-25 ENCOUNTER — Encounter (HOSPITAL_COMMUNITY): Payer: Self-pay | Admitting: Radiology

## 2015-05-25 DIAGNOSIS — E43 Unspecified severe protein-calorie malnutrition: Secondary | ICD-10-CM

## 2015-05-25 DIAGNOSIS — D62 Acute posthemorrhagic anemia: Secondary | ICD-10-CM

## 2015-05-25 DIAGNOSIS — I5032 Chronic diastolic (congestive) heart failure: Secondary | ICD-10-CM

## 2015-05-25 DIAGNOSIS — I10 Essential (primary) hypertension: Secondary | ICD-10-CM

## 2015-05-25 DIAGNOSIS — I739 Peripheral vascular disease, unspecified: Secondary | ICD-10-CM

## 2015-05-25 LAB — COMPREHENSIVE METABOLIC PANEL
ALBUMIN: 2.3 g/dL — AB (ref 3.5–5.0)
ALT: 16 U/L (ref 14–54)
AST: 29 U/L (ref 15–41)
Alkaline Phosphatase: 49 U/L (ref 38–126)
Anion gap: 7 (ref 5–15)
CHLORIDE: 107 mmol/L (ref 101–111)
CO2: 24 mmol/L (ref 22–32)
CREATININE: 0.32 mg/dL — AB (ref 0.44–1.00)
Calcium: 7.3 mg/dL — ABNORMAL LOW (ref 8.9–10.3)
GFR calc Af Amer: >60 mL/min (ref >60)
GLUCOSE: 85 mg/dL (ref 65–99)
POTASSIUM: 3.1 mmol/L — AB (ref 3.5–5.1)
Sodium: 138 mmol/L (ref 135–145)
Total Bilirubin: 1.4 mg/dL — ABNORMAL HIGH (ref 0.3–1.2)
Total Protein: 5.2 g/dL — ABNORMAL LOW (ref 6.5–8.1)

## 2015-05-25 LAB — CBC
HEMATOCRIT: 26.9 % — AB (ref 36.0–46.0)
HEMATOCRIT: 28.8 % — AB (ref 36.0–46.0)
HEMATOCRIT: 29.9 % — AB (ref 36.0–46.0)
HEMOGLOBIN: 9.7 g/dL — AB (ref 12.0–15.0)
Hemoglobin: 8.7 g/dL — ABNORMAL LOW (ref 12.0–15.0)
Hemoglobin: 9.3 g/dL — ABNORMAL LOW (ref 12.0–15.0)
MCH: 30.1 pg (ref 26.0–34.0)
MCH: 30.2 pg (ref 26.0–34.0)
MCH: 30.1 pg (ref 26.0–34.0)
MCHC: 32.3 g/dL (ref 30.0–36.0)
MCHC: 32.3 g/dL (ref 30.0–36.0)
MCHC: 32.4 g/dL (ref 30.0–36.0)
MCV: 93.1 fL (ref 78.0–100.0)
MCV: 93.5 fL (ref 78.0–100.0)
MCV: 92.9 fL (ref 78.0–100.0)
PLATELETS: 144 10*3/uL — AB (ref 150–400)
PLATELETS: 136 10*3/uL — AB (ref 150–400)
Platelets: 156 10*3/uL (ref 150–400)
RBC: 2.89 MIL/uL — ABNORMAL LOW (ref 3.87–5.11)
RBC: 3.08 MIL/uL — ABNORMAL LOW (ref 3.87–5.11)
RBC: 3.22 MIL/uL — AB (ref 3.87–5.11)
RDW: 16.1 % — AB (ref 11.5–15.5)
RDW: 16.2 % — AB (ref 11.5–15.5)
RDW: 16.4 % — ABNORMAL HIGH (ref 11.5–15.5)
WBC: 6.2 10*3/uL (ref 4.0–10.5)
WBC: 6.2 10*3/uL (ref 4.0–10.5)
WBC: 6.2 10*3/uL (ref 4.0–10.5)

## 2015-05-25 LAB — GLUCOSE, CAPILLARY: Glucose-Capillary: 78 mg/dL (ref 65–99)

## 2015-05-25 LAB — TSH: TSH: 5.185 u[IU]/mL — ABNORMAL HIGH (ref 0.350–4.500)

## 2015-05-25 LAB — LIPID PANEL
Cholesterol: 78 mg/dL (ref 0–200)
HDL: 26 mg/dL — ABNORMAL LOW (ref >40)
LDL CALC: 43 mg/dL (ref 0–99)
Total CHOL/HDL Ratio: 3 RATIO
Triglycerides: 47 mg/dL (ref <150)
VLDL: 9 mg/dL (ref 0–40)

## 2015-05-25 LAB — HAPTOGLOBIN: HAPTOGLOBIN: 157 mg/dL (ref 34–200)

## 2015-05-25 LAB — BRAIN NATRIURETIC PEPTIDE: B Natriuretic Peptide: 840 pg/mL — ABNORMAL HIGH (ref 0.0–100.0)

## 2015-05-25 LAB — MRSA PCR SCREENING: MRSA BY PCR: NEGATIVE

## 2015-05-25 MED ORDER — MORPHINE SULFATE ER 15 MG PO TBCR: 15.0000 mg | EXTENDED_RELEASE_TABLET | Freq: Two times a day (BID) | ORAL | Status: DC

## 2015-05-25 MED ORDER — ENSURE ENLIVE PO LIQD: 237.0000 mL | Freq: Two times a day (BID) | ORAL | Status: DC

## 2015-05-25 MED ORDER — DEXTROSE-NACL 5-0.9 % IV SOLN
INTRAVENOUS | Status: DC
  Administered 2015-05-25: 08:00:00 via INTRAVENOUS

## 2015-05-25 MED ORDER — OMEPRAZOLE 20 MG PO CPDR: 20.0000 mg | DELAYED_RELEASE_CAPSULE | Freq: Every day | ORAL | Status: AC

## 2015-05-25 NOTE — Evaluation (Signed)
Physical Therapy Evaluation Patient Details Name: Shannon Rice MRN: [id number] DOB: 26-Sep-1930 Today's Date: 05/25/2015   History of Present Illness  Patient is a 81 y/o female with hx of HTN, lupus, PVD, left BKA, HLD, GERD, lupus, scleroderma, arthritis, who presents with symptomatic anemia. 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  Clinical Impression  Patient presents with generalized weakness and mild confusion today. Tolerated SPT x2 with Mod-Max A for safety. Daughter present during evaluation and states pt requiring assist for transfers at rehab. Plan to discharge back to rehab when medically stable. Will follow acutely to maximize independence and mobility prior to return [facility].    Follow Up Recommendations SNF    Equipment Recommendations  None recommended by PT    Recommendations for Other Services       Precautions / Restrictions Precautions Precautions: Fall Precaution Comments: left BKA Restrictions Weight Bearing Restrictions: No      Mobility  Bed Mobility Overal bed mobility: Needs Assistance Bed Mobility: Supine to Sit     Supine to sit: Mod assist     General bed mobility comments: Mod A to scoot bottom to EOB with LOB.  Transfers Overall transfer level: Needs assistance Equipment used: None Transfers: Stand Pivot Transfers;Sit to/from Stand Sit to Stand: Max assist Stand pivot transfers: Mod assist;+2 safety/equipment       General transfer comment: SPT bed to [facility] with therapist blocking RLE; SPT BSC to chair. Cues for hand placement.   Ambulation/Gait Ambulation/Gait assistance:  (Not assessed)              Stairs            Wheelchair Mobility    Modified Rankin (Stroke Patients Only)       Balance Overall balance assessment: Needs assistance Sitting-balance support: Feet supported;Bilateral upper extremity supported Sitting  balance-Leahy Scale: Fair Sitting balance - Comments: LOB when trying to scoot bottom to EOB.                                     Pertinent Vitals/Pain Pain Assessment: No/denies pain Faces Pain Scale: No hurt    Home Living Family/patient expects to be discharged to:: Skilled nursing facility                      Prior Function        ADL's / Homemaking Assistance Needed: Pt reports she recieves assistance from SNF staff for bathing and dressing  Comments: Daughter present during session and reports pt requires assist for transfers and using w/c for mobility.      Hand Dominance   Dominant Hand: Right    Extremity/Trunk Assessment   Upper Extremity Assessment: Defer to OT evaluation           Lower Extremity Assessment: LLE deficits/detail;Generalized weakness   LLE Deficits / Details: s/p left BKA     Communication   Communication: No difficulties  Cognition Arousal/Alertness: Awake/alert Behavior During Therapy: WFL for tasks assessed/performed Overall Cognitive Status: Impaired/Different from baseline Area of Impairment: Orientation Orientation Level: Disoriented to;Time;Situation                  General Comments General comments (skin integrity, edema, etc.): Daughter present during evaluation.    Exercises        Assessment/Plan  PT Assessment Patient needs continued PT services  PT Diagnosis Generalized weakness;Difficulty walking   PT Problem List Decreased strength;Decreased activity tolerance;Decreased balance;Decreased mobility;Decreased cognition;Decreased skin integrity  PT Treatment Interventions Balance training;Functional mobility training;Therapeutic exercise;Wheelchair mobility training;Therapeutic activities;Patient/family education;Gait training   PT Goals (Current goals can be found in the Care Plan section) Acute Rehab PT Goals Patient Stated Goal: none stated PT Goal Formulation: With  patient/family Time For Goal Achievement: 06/08/15 Potential to Achieve Goals: Fair    Frequency Min 2X/week   Barriers to discharge        Co-evaluation               End of Session Equipment Utilized During Treatment: Gait belt Activity Tolerance: Patient tolerated treatment well Patient left: in chair;with call bell/phone within reach;with family/visitor present (daughter asked to let RN know when she is leaving) Nurse Communication: Mobility status         Time: 1002-1021 PT Time Calculation (min) (ACUTE ONLY): 19 min   Charges:   PT Evaluation $Initial PT Evaluation Tier I: 1 Procedure     PT G Codes:        Shaquill Iseman A Anmol Fleck 05/25/2015, 11:36 AM Mylo RedShauna Loeta Herst, PT, DPT [phone]

## 2015-05-25 NOTE — Progress Notes (Signed)
Utilization review completed.  

## 2015-05-25 NOTE — Care Management Note (Signed)
Case Management Note Donn PieriniKristi Dylanie Quesenberry RN, BSN Unit 2W-Case Manager [phone]  Patient Details  Name: Cyndia SkeetersJohnetta M Gargan MRN: [id number] Date of Birth: 1930/12/19  Subjective/Objective:   Pt admitted with symptomatic anemia                 Action/Plan: PTA pt lived at [facility]- CSW consulted for return to SNF when medically stable  Expected Discharge Date:                Expected Discharge Plan:  Skilled Nursing Facility  In-House Referral:  Clinical Social Work  Discharge planning Services     Post Acute Care Choice:    Choice offered to:     DME Arranged:    DME Agency:     HH Arranged:    HH Agency:     Status of Service:  In process, will continue to follow  Medicare Important Message Given:    Date Medicare IM Given:    Medicare IM give by:    Date Additional Medicare IM Given:    Additional Medicare Important Message give by:     If discussed at Long Length of Stay Meetings, dates discussed:    Additional Comments:  Darrold SpanWebster, Madden Piazza Hall, RN 05/25/2015, 10:40 AM

## 2015-05-25 NOTE — Progress Notes (Signed)
Patient experiencing occasional PVC's and bigeminy. Patient is asymptomatic.

## 2015-05-25 NOTE — Progress Notes (Signed)
Patient discharged to [facility] by EMS, daughter will meet her there. Report called into the nurse at the facility and was informed about the morphine prescription.  IV was taken out, intact.

## 2015-05-25 NOTE — Discharge Summary (Signed)
Physician Discharge Summary  ELMER BOUTELLE ZOX:[id number] DOB: 03-12-1931 DOA: 05/24/2015  PCP: Ralene Ok, MD  Admit date: 05/24/2015 Discharge date: 05/25/2015  Time spent: 35 minutes  Recommendations for Outpatient Follow-up:  1. New medication: Prilosec 20 mg by mouth daily 2. Medication change: See list is all being discontinued  3. Patient going back to skilled nursing facility 4. Patient needs repeat CBC to be done 11/21 and then again on 11/28   Discharge Diagnoses:  Principal Problem:   Symptomatic anemia Active Problems:   Scleroderma (HCC)   Peripheral artery disease (HCC)   HTN (hypertension)   Protein-calorie malnutrition, severe (HCC)   HLD (hyperlipidemia)   GERD (gastroesophageal reflux disease)   Arthritis   Chronic diastolic (congestive) heart failure (HCC)   Discharge Condition: Improved, going back to Skilled Nursing Facility  Diet recommendation: Heart healthy  Filed Weights   05/24/15 2125  Weight: 33.702 kg (74 lb 4.8 oz)    History of present illness:  Patient is an 81 year old female with past medical history of severe peripheral arterial disease and secondary limb ischemia status post atherectomy plus drug-coated balloon angioplasty of a number of right sided lower extremity arteries by cardiology a week prior was discharged to the skilled nursing facility on aspirin, Plavix and cilostazol.  Patient had follow-up routine blood work done in the nursing facility which noted a hemoglobin of 6.1. Patient felt lightheaded and dizzy and was brought into the emergency room. Her last hemoglobin checked in the hospital was on 10/14 at 9.2  in the emergency room, patient was ordered 2 units packed red blood cells. Rest of her lab work including MCV were normal. She was not acutely hypotensive. Hemoccult was negative.  Hospital Course:  Principal Problem:   Symptomatic anemia/subacute blood loss anemia: Follow transfusion, hemoglobin improved to 9.3.  Vital signs stable. It's felt that likely she had a subacute bleed at some point in the last 30 days.  No evidence of active bleeding. In discussion with the family and patient, they opted for non-aggressive measures and wanted to hold off on endoscopy or colonoscopy. Medication changes as below. Patient will follow up with cardiology. Repeat CBC at skilled nursing facility next 2 minus Active Problems:   Scleroderma (HCC)   Peripheral artery disease [facility]): Discussed with cardiology. Concerns for bleeding, so recommended to discontinue cilostazol. Continue aspirin and Plavix, managing this with PPI   HTN (hypertension): Stable   Protein-calorie malnutrition, severe [facility]): Patient meets criteria in the context of severe illness. Started on ensure twice a day between meals, prescription given upon discharge   HLD (hyperlipidemia)   GERD (gastroesophageal reflux disease): Stable. Prilosec added. See above   Arthritis   Chronic diastolic (congestive) heart failure (HCC): Stable. Euvolemic. Patient received Lasix following blood transfusion   Procedures:  11/15:2 units packed red blood cells transfused  Consultations:  None  Discharge Exam: Filed Vitals:   05/25/15 1045  BP: 150/80  Pulse:   Temp:   Resp:     General: Alert & oriented x 2, no acute distress Cardiovascular: RRR S1S2 Respiratory: Clear to auscultation bilaterally  Discharge Instructions   Discharge Instructions    Diet - low sodium heart healthy    Complete by:  As directed      Increase activity slowly    Complete by:  As directed             Medication List    STOP taking these medications        cilostazol  50 MG tablet  Commonly known as:  PLETAL     methocarbamol 500 MG tablet  Commonly known as:  ROBAXIN      TAKE these medications        acetaminophen 325 MG tablet  Commonly known as:  TYLENOL  Take 2 tablets (650 mg total) by mouth every 6 (six) hours as needed for mild pain (or Fever >/=  101).     amLODipine-benazepril 5-20 MG capsule  Commonly known as:  LOTREL  Take 1 capsule by mouth daily.     aspirin 81 MG EC tablet  Take 1 tablet (81 mg total) by mouth daily.     clopidogrel 75 MG tablet  Commonly known as:  PLAVIX  Take 1 tablet (75 mg total) by mouth daily.     feeding supplement (ENSURE ENLIVE) Liqd  Take 237 mLs by mouth 2 (two) times daily between meals.     LIVALO 2 MG Tabs  Generic drug:  Pitavastatin Calcium  Take 2 mg by mouth 2 (two) times daily.     morphine 15 MG 12 hr tablet  Commonly known as:  MS CONTIN  Take 1 tablet (15 mg total) by mouth every 12 (twelve) hours.     omeprazole 20 MG capsule  Commonly known as:  PRILOSEC  Take 1 capsule (20 mg total) by mouth daily.     ondansetron 4 MG disintegrating tablet  Commonly known as:  ZOFRAN ODT  Take 1 tablet (4 mg total) by mouth every 8 (eight) hours as needed for nausea or vomiting.     oxyCODONE 5 MG immediate release tablet  Commonly known as:  ROXICODONE  Take 1-2 tablets (5-10 mg total) by mouth every 4 (four) hours as needed for severe pain.     polyethylene glycol packet  Commonly known as:  MIRALAX / GLYCOLAX  Take 17 g by mouth daily as needed for moderate constipation.     pregabalin 50 MG capsule  Commonly known as:  LYRICA  Take 1 capsule (50 mg total) by mouth 3 (three) times daily.        No Known Allergies    The results of significant diagnostics from this hospitalization (including imaging, microbiology, ancillary and laboratory) are listed below for reference.    Significant Diagnostic Studies: Ct Abdomen Pelvis Wo Contrast  05/25/2015  CLINICAL DATA:  Acute onset of dizziness. Anemia. Assess for retroperitoneal bleed. Initial encounter. EXAM: CT ABDOMEN AND PELVIS WITHOUT CONTRAST TECHNIQUE: Multidetector CT imaging of the abdomen and pelvis was performed following the standard protocol without IV contrast. COMPARISON:  CT of the abdomen and pelvis  performed 08/24/2010 FINDINGS: There is no evidence of retroperitoneal hemorrhage. Mild diffuse soft tissue edema is noted about the abdomen and pelvis, likely reflecting mild anasarca. Trace bilateral pleural fluid is noted, with bibasilar atelectasis. Mild biatrial enlargement is noted. Diffuse coronary artery calcifications are seen. The liver and spleen are unremarkable in appearance. The gallbladder is within normal limits. The pancreas and adrenal glands are unremarkable. A few small hyperdense foci within the kidneys may reflect small hyperdense cysts. There is no evidence of hydronephrosis. A 2 mm stone is noted at the upper pole of the right kidney. No obstructing ureteral stones are seen. No perinephric stranding is appreciated. No free fluid is identified. The small bowel is unremarkable in appearance. The stomach is within normal limits. No acute vascular abnormalities are seen. Diffuse calcification is noted along the abdominal aorta and its branches. The appendix is  normal in caliber, without evidence of appendicitis. The colon is unremarkable in appearance. The bladder is significantly distended and grossly unremarkable. The patient is status post hysterectomy. No suspicious adnexal masses are seen. No inguinal lymphadenopathy is seen. No acute osseous abnormalities are identified. A chronic right-sided pars defect is noted at L5. IMPRESSION: 1. No evidence of acute hemorrhage. 2. Mild diffuse soft tissue edema about the abdomen and pelvis, likely reflecting mild anasarca. 3. Trace bilateral pleural fluid, with bibasilar atelectasis. 4. Mild biatrial enlargement noted. 5. Diffuse coronary artery calcifications seen. 6. Diffuse calcification along the abdominal aorta and branches. 7. Few small hyperdense foci within the kidneys may reflect small hyperdense cysts. 2 mm nonobstructing stone at the upper pole of the right kidney. 8. Chronic right-sided pars defect at L5. Electronically Signed   By:  Roanna RaiderJeffery  Chang M.D.   On: 05/25/2015 02:55    Microbiology: Recent Results (from the past 240 hour(s))  MRSA PCR Screening     Status: None   Collection Time: 05/25/15  4:33 AM  Result Value Ref Range Status   MRSA by PCR NEGATIVE NEGATIVE Final    Comment:        The GeneXpert MRSA Assay (FDA approved for NASAL specimens only), is one component of a comprehensive MRSA colonization surveillance program. It is not intended to diagnose MRSA infection nor to guide or monitor treatment for MRSA infections.      Labs: Basic Metabolic Panel:  Recent Labs Lab 05/24/15 1750 05/25/15 0616  NA 137 138  K 3.6 3.1*  CL 97* 107  CO2 30 24  GLUCOSE 96 85  BUN 6 <5*  CREATININE 0.43* 0.32*  CALCIUM 8.7* 7.3*   Liver Function Tests:  Recent Labs Lab 05/24/15 1750 05/25/15 0616  AST 40 29  ALT 18 16  ALKPHOS 68 49  BILITOT 0.9 1.4*  PROT 6.7 5.2*  ALBUMIN 2.9* 2.3*   CBC:  Recent Labs Lab 05/24/15 1750 05/24/15 2111 05/25/15 0616 05/25/15 0800  WBC 7.2 6.5 6.2 6.2  NEUTROABS 5.6  --   --   --   HGB 6.6* 6.5* 8.7* 9.3*  HCT 21.9* 21.6* 26.9* 28.8*  MCV 97.3 97.7 93.1 93.5  PLT 186 184 144* 136*   BNP: BNP (last 3 results)  Recent Labs  05/25/15 0616  BNP 840.0*    ProBNP (last 3 results) No results for input(s): PROBNP in the last 8760 hours.  CBG:  Recent Labs Lab 05/25/15 0626  GLUCAP 78       Signed:  Stacie Templin K  [facility] 05/25/2015, 12:29 PM

## 2015-05-25 NOTE — Clinical Social Work Note (Signed)
Clinical Social Worker received referral for patient return to [facility].  Chart reviewed.  Patient has only been hospitalized for 10 hours and ready for return.  Patient daughter and facility in agreement with return.  Clinical Social Worker facilitated patient discharge including contacting patient family and facility to confirm patient discharge plans.  Clinical information faxed to facility and family agreeable with plan.  [facility] from [facility] [place] from [place].  CSW arranged ambulance transport via [facility] to [facility].  RN to call report prior to discharge.  Clinical Social Worker will sign off for now as social work intervention is no longer needed. Please consult us again if new need arises.  Shannon Rice, [place] [phone]

## 2015-05-25 NOTE — Care Management Important Message (Signed)
Important Message  Patient Details  Name: Shannon Rice MRN: [id number] Date of Birth: [birth year]   Medicare Important Message Given:  N/A - LOS <3 / Initial given by admissions    Darrold SpanWebster, Amiri Riechers Hall, RN 05/25/2015, 2:07 PM

## 2015-05-26 ENCOUNTER — Non-Acute Institutional Stay (SKILLED_NURSING_FACILITY): Payer: Medicare Other | Admitting: Internal Medicine

## 2015-05-26 DIAGNOSIS — I739 Peripheral vascular disease, unspecified: Secondary | ICD-10-CM | POA: Diagnosis not present

## 2015-05-26 DIAGNOSIS — G546 Phantom limb syndrome with pain: Secondary | ICD-10-CM

## 2015-05-26 DIAGNOSIS — I10 Essential (primary) hypertension: Secondary | ICD-10-CM | POA: Diagnosis not present

## 2015-05-26 DIAGNOSIS — K219 Gastro-esophageal reflux disease without esophagitis: Secondary | ICD-10-CM

## 2015-05-26 DIAGNOSIS — R5381 Other malaise: Secondary | ICD-10-CM | POA: Diagnosis not present

## 2015-05-26 DIAGNOSIS — K5909 Other constipation: Secondary | ICD-10-CM

## 2015-05-26 DIAGNOSIS — I70211 Atherosclerosis of native arteries of extremities with intermittent claudication, right leg: Secondary | ICD-10-CM

## 2015-05-26 DIAGNOSIS — E43 Unspecified severe protein-calorie malnutrition: Secondary | ICD-10-CM | POA: Diagnosis not present

## 2015-05-26 DIAGNOSIS — D62 Acute posthemorrhagic anemia: Secondary | ICD-10-CM

## 2015-05-26 LAB — TYPE AND SCREEN
ABO/RH(D): AB POS
Antibody Screen: NEGATIVE
UNIT DIVISION: 0
Unit division: 0

## 2015-05-27 ENCOUNTER — Non-Acute Institutional Stay (SKILLED_NURSING_FACILITY): Payer: Medicare Other | Admitting: Nurse Practitioner

## 2015-05-27 DIAGNOSIS — I10 Essential (primary) hypertension: Secondary | ICD-10-CM

## 2015-05-27 DIAGNOSIS — E43 Unspecified severe protein-calorie malnutrition: Secondary | ICD-10-CM

## 2015-05-27 DIAGNOSIS — R52 Pain, unspecified: Secondary | ICD-10-CM

## 2015-05-27 DIAGNOSIS — K5909 Other constipation: Secondary | ICD-10-CM

## 2015-05-27 DIAGNOSIS — D62 Acute posthemorrhagic anemia: Secondary | ICD-10-CM

## 2015-05-27 DIAGNOSIS — G546 Phantom limb syndrome with pain: Secondary | ICD-10-CM

## 2015-05-27 DIAGNOSIS — I70229 Atherosclerosis of native arteries of extremities with rest pain, unspecified extremity: Secondary | ICD-10-CM

## 2015-05-27 DIAGNOSIS — I998 Other disorder of circulatory system: Secondary | ICD-10-CM

## 2015-05-27 DIAGNOSIS — I739 Peripheral vascular disease, unspecified: Secondary | ICD-10-CM

## 2015-05-27 NOTE — Progress Notes (Signed)
Patient ID: [facility], female   DOB: [birth year], 81 y.o.   MRN: [id number]   Nursing Home Location:  [facility]   Place of Service: SNF (31)  PCP: Ralene Ok, MD  No Known Allergies  Chief Complaint  Patient presents with  . Discharge Note    HPI:  Patient is a 81 y.o. female seen today at [facility] for discharge.  pt with a hx of scleroderma, HTN, PVD, HLD. Patient currently at [facility] for short term rehabilitation post hospital admission from 04/12/15-04/22/15 with left foot gangrene with claudication and osteomyelitis and underwent left lower extremity transtibial amputation by Dr. Lajoyce Corners on 04/20/15.Pt also has right lower limb ischemia with calcification and stenosis and is currently on medical management.  Pt was planning to be discharged but lab work revealed critical low hgb and pt was transfused. Felt to be a subacute blood loss without active bleeding. Family did not want aggressive work up. cilostazol was stopped and ASA and plavix cont and prilosec added. Patient currently doing well with therapy, now stable to discharge home with home health.   Review of Systems:  Review of Systems  Constitutional: Negative for fever, activity change, appetite change, fatigue and unexpected weight change.  HENT: Negative for congestion and hearing loss.   Eyes: Negative.   Respiratory: Negative for cough and shortness of breath.   Cardiovascular: Negative for chest pain, palpitations and leg swelling.  Gastrointestinal: Negative for abdominal pain, diarrhea and constipation.  Genitourinary: Negative for dysuria and difficulty urinating.  Musculoskeletal: Negative for myalgias and arthralgias.  Skin: Negative for color change.  Neurological: Negative for dizziness and weakness.  Psychiatric/Behavioral: Positive for confusion (memory loss noted). Negative for behavioral problems and agitation.    Past Medical History  Diagnosis Date    . Hypertension   . GERD (gastroesophageal reflux disease)   . Lupus (HCC)     skin, scleraderma  . Scleroderma (HCC)   . Arthritis     scleroderma  . Peripheral vascular disease (HCC)   . PAD (peripheral artery disease) (HCC)   . Heart murmur   . Chronic diastolic (congestive) heart failure [facility])    Past Surgical History  Procedure Laterality Date  . Abdominal hysterectomy    . Coronary angioplasty      Dr [name]  . Sympathectomy Right 01/27/2015    Procedure: RIGHT RADICAL ARTERY SYMPATHECTOMY, RIGHT ULNAR ARTERY SYMPATHECTOMY, DIGITAL SYMPATHECTOMY RIGHT INDEX, MIDDLE, RING SMALL;  Surgeon: [name], MD;  Location: [facility];  Service: Orthopedics;  Laterality: Right;  . Amputation Right 01/27/2015    Procedure: AMPUTATION DISTAL TIP RIGHT INDEX FINGER;  Surgeon: [name], MD;  Location: [facility];  Service: Orthopedics;  Laterality: Right;  . Peripheral vascular catheterization N/A 02/15/2015    Procedure: Lower Extremity Angiography;  Surgeon: [name], MD;  Location: [facility];  Service: Cardiovascular;  Laterality: N/A;  . Peripheral vascular catheterization Left 04/12/2015    Procedure: Lower Extremity Angiography;  Surgeon: [name], MD;  Location: [facility];  Service: Cardiovascular;  Laterality: Left;  . Peripheral vascular catheterization Left 04/12/2015    Procedure: Peripheral Vascular Atherectomy;  Surgeon: [name], MD;  Location: [facility];  Service: Cardiovascular;  Laterality: Left;  sfa     . Amputation Left 04/20/2015    Procedure: Left Below Knee Amputation;  Surgeon: Nadara Mustard, MD;  Location: [facility];  Service: Orthopedics;  Laterality:  Left;  . Peripheral arterial stent graft Left 05/17/2015  . Peripheral vascular catheterization N/A 05/17/2015    Procedure: Lower Extremity Intervention;  Surgeon: [name], MD;  Location: [facility];  Service: Cardiovascular;  Laterality: N/A;   Social  History:   reports that she quit smoking about 5 months ago. Her smoking use included Cigarettes. She has a 15 pack-year smoking history. She has never used smokeless tobacco. She reports that she drinks alcohol. She reports that she does not use illicit drugs.  Family History  Problem Relation Age of Onset  . Diabetes Sister   . Diabetes Brother     Medications: Patient's Medications  New Prescriptions   No medications on file  Previous Medications   ACETAMINOPHEN (TYLENOL) 325 MG TABLET    Take 2 tablets (650 mg total) by mouth every 6 (six) hours as needed for mild pain (or Fever >/= 101).   AMLODIPINE-BENAZEPRIL (LOTREL) 5-20 MG PER CAPSULE    Take 1 capsule by mouth daily.   ASPIRIN EC 81 MG EC TABLET    Take 1 tablet (81 mg total) by mouth daily.   CLOPIDOGREL (PLAVIX) 75 MG TABLET    Take 1 tablet (75 mg total) by mouth daily.   FEEDING SUPPLEMENT, ENSURE ENLIVE, (ENSURE ENLIVE) LIQD    Take 237 mLs by mouth 2 (two) times daily between meals.   MORPHINE (MS CONTIN) 15 MG 12 HR TABLET    Take 1 tablet (15 mg total) by mouth every 12 (twelve) hours.   MORPHINE (MS CONTIN) 15 MG 12 HR TABLET    Take 15 mg by mouth every 12 (twelve) hours.   OMEPRAZOLE (PRILOSEC) 20 MG CAPSULE    Take 1 capsule (20 mg total) by mouth daily.   ONDANSETRON (ZOFRAN ODT) 4 MG DISINTEGRATING TABLET    Take 1 tablet (4 mg total) by mouth every 8 (eight) hours as needed for nausea or vomiting.   OXYCODONE (ROXICODONE) 5 MG IMMEDIATE RELEASE TABLET    Take 1-2 tablets (5-10 mg total) by mouth every 4 (four) hours as needed for severe pain.   PITAVASTATIN CALCIUM (LIVALO) 2 MG TABS    Take 2 mg by mouth 2 (two) times daily.   POLYETHYLENE GLYCOL (MIRALAX / GLYCOLAX) PACKET    Take 17 g by mouth daily as needed for moderate constipation.   PREGABALIN (LYRICA) 50 MG CAPSULE    Take 1 capsule (50 mg total) by mouth 3 (three) times daily.  Modified Medications   No medications on file  Discontinued Medications    No medications on file     Physical Exam: Filed Vitals:   05/27/15 1637  BP: 151/58  Pulse: 52  Temp: 98 F (36.7 C)  Resp: 20    Physical Exam  Constitutional: No distress.  Thin AA female NAD  HENT:  Head: Normocephalic and atraumatic.  Cardiovascular: Normal rate, regular rhythm and normal heart sounds.   Pulmonary/Chest: Effort normal and breath sounds normal.  Abdominal: Soft. Bowel sounds are normal. She exhibits no distension. There is no tenderness.  Musculoskeletal:  Left BKA Right foot with mild erythema with mild swelling  Neurological: She is alert.  Skin: Skin is warm and dry. She is not diaphoretic.  Psychiatric: She has a normal mood and affect.    Labs reviewed: Basic Metabolic Panel:  Recent Labs  16/10/96 0530 05/24/15 1750 05/25/15 0616  NA 133* 137 138  K 3.9 3.6 3.1*  CL 97* 97* 107  CO2 30  30 24  GLUCOSE 101* 96 85  BUN 6 6 <5*  CREATININE 0.42* 0.43* 0.32*  CALCIUM 8.2* 8.7* 7.3*   Liver Function Tests:  Recent Labs  04/19/15 1947 05/24/15 1750 05/25/15 0616  AST 20 40 29  ALT 11* 18 16  ALKPHOS 53 68 49  BILITOT 0.6 0.9 1.4*  PROT 6.1* 6.7 5.2*  ALBUMIN 2.6* 2.9* 2.3*    Recent Labs  04/13/15 1515  LIPASE 13*   No results for input(s): AMMONIA in the last 8760 hours. CBC:  Recent Labs  12/08/14 0755  04/19/15 1947  05/24/15 1750  05/25/15 0616 05/25/15 0800 05/25/15 1435  WBC 5.6  < > 6.5  < > 7.2  < > 6.2 6.2 6.2  NEUTROABS 3.5  --  4.7  --  5.6  --   --   --   --   HGB 12.7  < > 8.8*  < > 6.6*  < > 8.7* 9.3* 9.7*  HCT 39.6  < > 28.4*  < > 21.9*  < > 26.9* 28.8* 29.9*  MCV 94.3  < > 94.4  < > 97.3  < > 93.1 93.5 92.9  PLT 135*  < > 220  < > 186  < > 144* 136* 156  < > = values in this interval not displayed. TSH:  Recent Labs  05/25/15 0616  TSH 5.185*   A1C: No results found for: HGBA1C Lipid Panel:  Recent Labs  05/25/15 0616  CHOL 78  HDL 26*  LDLCALC 43  TRIG 47  CHOLHDL 3.0      Assessment/Plan  1. Critical lower limb ischemia With Left foot gangrene now s/p left bka for osteomyelitis. Pain controlled. Continue plavix for now with aspirin.   2. Phantom pain (HCC) -stable with lyrica 50 mg TID  3. Peripheral artery disease (HCC) Stable, review of epic showed 80% calcified stenosis to right femoral. Following with vascular due to right LE PAD.  Continue MS Contin 15 mg BID with oxycodone 5 mg PRN 1-2 tab q4h prn pain. Continue aspirin and plavix for now.   4. Essential hypertension Stable, lotrel 5-20 mg daily and lopressor 25 mg BID  5. Other constipation - stable, conts on miralax daily   6. Protein-calorie malnutrition, severe (HCC) conts on supplement  7. Anemia -no signs of active bleed, hgb stable -conts on omeprazole 20 mg daily, will need further outpatient follow up   Pt stable for discharge-will need PT/OT/Nursing/hha per home health. DME needed includes WC and 3-1 and hospital bed. Rx written.  will need to follow up with PCP within 2 weeks.  Janene HarveyJessica K. Biagio BorgEubanks, AGNP  [facility] [phone](Monday-Friday 8 am - 5 pm) [phone] (after hours)

## 2015-05-29 NOTE — Progress Notes (Signed)
Patient ID: [facility], female   DOB: 1930/09/19, 81 y.o.   MRN: [id number]     Facility: [facility]    PCP: Ralene OkMOREIRA,ROY, MD   No Known Allergies  Chief Complaint  Patient presents with  . Readmit To SNF     HPI:  81 y.o. patient is here for short term rehabilitation post hospital re-admission from 05/24/15-05/25/15 for symptomatic anemia with Hb of 6.1. She received 2 u prbc transfusion. No active bleeding documented. She was started on PPI and her pletal was discontinued. She is seen in her room today with her daughter present. She has appointment with cardiology dr Jacinto Halimganji this afternoon. She complaints of intermittent pain in her right foot and mentions that sometimes the pain is severe. Wound nurse has noticed some drainage from in between her toes in right foot.   Review of Systems:  Constitutional: Negative for fever, chills, diaphoresis.  HENT: Negative for headache, congestion, nasal discharge  Eyes: Negative for eye pain, blurred vision, double vision and discharge.  Respiratory: Negative for cough, shortness of breath and wheezing.   Cardiovascular: Negative for chest pain, palpitations.  Gastrointestinal: Negative for heartburn, nausea, vomiting, abdominal pain. Genitourinary: Negative for dysuria Neurological: positive for weakness. Negative for dizziness, tingling, focal weakness Psychiatric/Behavioral: Negative for depression    Past Medical History  Diagnosis Date  . Hypertension   . GERD (gastroesophageal reflux disease)   . Lupus (HCC)     skin, scleraderma  . Scleroderma (HCC)   . Arthritis     scleroderma  . Peripheral vascular disease (HCC)   . PAD (peripheral artery disease) (HCC)   . Heart murmur   . Chronic diastolic (congestive) heart failure [facility](HCC)    Past Surgical History  Procedure Laterality Date  . Abdominal hysterectomy    . Coronary angioplasty      Dr Jenne CampusMcQueen  . Sympathectomy Right 01/27/2015   Procedure: RIGHT RADICAL ARTERY SYMPATHECTOMY, RIGHT ULNAR ARTERY SYMPATHECTOMY, DIGITAL SYMPATHECTOMY RIGHT INDEX, MIDDLE, RING SMALL;  Surgeon: Cindee SaltGary Kuzma, MD;  Location: [facility];  Service: Orthopedics;  Laterality: Right;  . Amputation Right 01/27/2015    Procedure: AMPUTATION DISTAL TIP RIGHT INDEX FINGER;  Surgeon: Cindee SaltGary Kuzma, MD;  Location: [facility];  Service: Orthopedics;  Laterality: Right;  . Peripheral vascular catheterization N/A 02/15/2015    Procedure: Lower Extremity Angiography;  Surgeon: Yates DecampJay Ganji, MD;  [facility];  Service: Cardiovascular;  Laterality: N/A;  . Peripheral vascular catheterization Left 04/12/2015    Procedure: Lower Extremity Angiography;  Surgeon: Yates DecampJay Ganji, MD;  [facility];  Service: Cardiovascular;  Laterality: Left;  . Peripheral vascular catheterization Left 04/12/2015    Procedure: Peripheral Vascular Atherectomy;  Surgeon: Yates DecampJay Ganji, MD;  [facility];  Service: Cardiovascular;  Laterality: Left;  sfa     . Amputation Left 04/20/2015    Procedure: Left Below Knee Amputation;  Surgeon: Nadara MustardMarcus Duda V, MD;  [facility];  Service: Orthopedics;  Laterality: Left;  . Peripheral arterial stent graft Left 05/17/2015  . Peripheral vascular catheterization N/A 05/17/2015    Procedure: Lower Extremity Intervention;  Surgeon: Yates DecampJay Ganji, MD;  [facility];  Service: Cardiovascular;  Laterality: N/A;   Social History:   reports that she quit smoking about 5 months ago. Her smoking use included Cigarettes. She has a 15 pack-year smoking history. She has never used smokeless tobacco. She reports that she drinks alcohol. She  reports that she does not use illicit drugs.  Family History  Problem Relation Age of Onset  . Diabetes Sister   . Diabetes Brother     Medications:   Medication List       This list is accurate as of: 05/26/15 11:59 PM.  Always use your most  recent med list.               acetaminophen 325 MG tablet  Commonly known as:  TYLENOL  Take 2 tablets (650 mg total) by mouth every 6 (six) hours as needed for mild pain (or Fever >/= 101).     amLODipine-benazepril 5-20 MG capsule  Commonly known as:  LOTREL  Take 1 capsule by mouth daily.     aspirin 81 MG EC tablet  Take 1 tablet (81 mg total) by mouth daily.     clopidogrel 75 MG tablet  Commonly known as:  PLAVIX  Take 1 tablet (75 mg total) by mouth daily.     feeding supplement (ENSURE ENLIVE) Liqd  Take 237 mLs by mouth 2 (two) times daily between meals.     LIVALO 2 MG Tabs  Generic drug:  Pitavastatin Calcium  Take 2 mg by mouth 2 (two) times daily.     morphine 15 MG 12 hr tablet  Commonly known as:  MS CONTIN  Take 15 mg by mouth every 12 (twelve) hours.     morphine 15 MG 12 hr tablet  Commonly known as:  MS CONTIN  Take 1 tablet (15 mg total) by mouth every 12 (twelve) hours.     omeprazole 20 MG capsule  Commonly known as:  PRILOSEC  Take 1 capsule (20 mg total) by mouth daily.     ondansetron 4 MG disintegrating tablet  Commonly known as:  ZOFRAN ODT  Take 1 tablet (4 mg total) by mouth every 8 (eight) hours as needed for nausea or vomiting.     oxyCODONE 5 MG immediate release tablet  Commonly known as:  ROXICODONE  Take 1-2 tablets (5-10 mg total) by mouth every 4 (four) hours as needed for severe pain.     polyethylene glycol packet  Commonly known as:  MIRALAX / GLYCOLAX  Take 17 g by mouth daily as needed for moderate constipation.     pregabalin 50 MG capsule  Commonly known as:  LYRICA  Take 1 capsule (50 mg total) by mouth 3 (three) times daily.         Physical Exam: Filed Vitals:   05/26/15 0802  BP: 135/72  Pulse: 96  Temp: 98.8 F (37.1 C)  Resp: 18  SpO2: 96%    General- elderly female, thin built, in no acute distress Head- normocephalic, atraumatic Throat- moist mucus membrane Eyes- PERRLA, EOMI, no pallor, no  icterus, no discharge, normal conjunctiva, normal sclera Neck- no cervical lymphadenopathy Cardiovascular- normal s1,s2 Respiratory- bilateral clear to auscultation, no wheeze, no rhonchi, no crackles, on o2 Abdomen- bowel sounds present, soft, non tender Musculoskeletal- left BKA, right foot edema +, purplish discoloration, gangrene to her toes and some clear drainage to her foot dressing, no crepitus to the foot, absent pulses to her right foot, right foot cold to touch, normal temperature for above the ankle Neurological- no focal deficit, alert and oriented to person, place and time Psychiatry- normal mood and affect    Labs reviewed: Basic Metabolic Panel:  Recent Labs  16/10/96 0530 05/24/15 1750 05/25/15 0616  NA 133* 137 138  K 3.9 3.6 3.1*  CL 97* 97* 107  CO2 30 30 24   GLUCOSE 101* 96 85  BUN 6 6 <5*  CREATININE 0.42* 0.43* 0.32*  CALCIUM 8.2* 8.7* 7.3*   Liver Function Tests:  Recent Labs  04/19/15 1947 05/24/15 1750 05/25/15 0616  AST 20 40 29  ALT 11* 18 16  ALKPHOS 53 68 49  BILITOT 0.6 0.9 1.4*  PROT 6.1* 6.7 5.2*  ALBUMIN 2.6* 2.9* 2.3*    Recent Labs  04/13/15 1515  LIPASE 13*   No results for input(s): AMMONIA in the last 8760 hours. CBC:  Recent Labs  12/08/14 0755  04/19/15 1947  05/24/15 1750  05/25/15 0616 05/25/15 0800 05/25/15 1435  WBC 5.6  < > 6.5  < > 7.2  < > 6.2 6.2 6.2  NEUTROABS 3.5  --  4.7  --  5.6  --   --   --   --   HGB 12.7  < > 8.8*  < > 6.6*  < > 8.7* 9.3* 9.7*  HCT 39.6  < > 28.4*  < > 21.9*  < > 26.9* 28.8* 29.9*  MCV 94.3  < > 94.4  < > 97.3  < > 93.1 93.5 92.9  PLT 135*  < > 220  < > 186  < > 144* 136* 156  < > = values in this interval not displayed.  CBG:  Recent Labs  05/25/15 0626  GLUCAP 78    Radiological Exams: Ct Abdomen Pelvis Wo Contrast  05/25/2015  CLINICAL DATA:  Acute onset of dizziness. Anemia. Assess for retroperitoneal bleed. Initial encounter. EXAM: CT ABDOMEN AND PELVIS WITHOUT  CONTRAST TECHNIQUE: Multidetector CT imaging of the abdomen and pelvis was performed following the standard protocol without IV contrast. COMPARISON:  CT of the abdomen and pelvis performed 08/24/2010 FINDINGS: There is no evidence of retroperitoneal hemorrhage. Mild diffuse soft tissue edema is noted about the abdomen and pelvis, likely reflecting mild anasarca. Trace bilateral pleural fluid is noted, with bibasilar atelectasis. Mild biatrial enlargement is noted. Diffuse coronary artery calcifications are seen. The liver and spleen are unremarkable in appearance. The gallbladder is within normal limits. The pancreas and adrenal glands are unremarkable. A few small hyperdense foci within the kidneys may reflect small hyperdense cysts. There is no evidence of hydronephrosis. A 2 mm stone is noted at the upper pole of the right kidney. No obstructing ureteral stones are seen. No perinephric stranding is appreciated. No free fluid is identified. The small bowel is unremarkable in appearance. The stomach is within normal limits. No acute vascular abnormalities are seen. Diffuse calcification is noted along the abdominal aorta and its branches. The appendix is normal in caliber, without evidence of appendicitis. The colon is unremarkable in appearance. The bladder is significantly distended and grossly unremarkable. The patient is status post hysterectomy. No suspicious adnexal masses are seen. No inguinal lymphadenopathy is seen. No acute osseous abnormalities are identified. A chronic right-sided pars defect is noted at L5. IMPRESSION: 1. No evidence of acute hemorrhage. 2. Mild diffuse soft tissue edema about the abdomen and pelvis, likely reflecting mild anasarca. 3. Trace bilateral pleural fluid, with bibasilar atelectasis. 4. Mild biatrial enlargement noted. 5. Diffuse coronary artery calcifications seen. 6. Diffuse calcification along the abdominal aorta and branches. 7. Few small hyperdense foci within the  kidneys may reflect small hyperdense cysts. 2 mm nonobstructing stone at the upper pole of the right kidney. 8. Chronic right-sided pars defect at L5. Electronically Signed   By: Roanna Raider  M.D.   On: 05/25/2015 02:55    Assessment/Plan  Physical deconditioning S/p left BKA. Had ecent symptomatic anemia requiring blood transfusion and has severe PAD to right lower extremity. All of these are contributing to her deconditioning. Will have patient work with PT/OT as tolerated to regain strength and restore function.  Fall precautions are in place.  Right leg PAD S/p angioplasty. Currently off cilostazol with bleeding risk. Continue aspirin and plavix. Has appointment with dr Jacinto Halim this afternoon. Will need assessment for possible wet gangrene and further treatment plan discussion.  Claudication  Continue mscontin 15 mg bid with oxycodone 5 mg 1-2 tab q4h prn pain. Continue aspirin and plavix for now. Will need further evaluation for repeat angioplasty vs amputation  Blood loss anemia S/p 2 u prbc transfusion in hospital. Monitor cbc for now. Continue prilosec 20 mg daily  Phantom pain In left amputation site. Continue lyrica 50 mg tid for now  gerd Continue prilosec 20 mg daily  HTN Continue lotrel 5-20 mg daily and monitor  Protein calorie malnutrition Monitor po intake and weight for now. Encourage po intakre. Continue feeding supplement  Constipation Continue miralax daily for now and hydration encouraged   Goals of care: short term rehabilitation   Labs/tests ordered: cbc, cmp   Family/ staff Communication: reviewed care plan with patient and nursing supervisor    Oneal Grout, MD  [facility] [phone] (Monday-Friday 8 am - 5 pm) [phone] (afterhours)

## 2015-05-31 NOTE — Progress Notes (Signed)
Late entry for missed G-code. Based on review of the evaluation and goals by Samul Dadauth Stout, PT.   05/19/15 1236  PT G-Codes **NOT FOR INPATIENT CLASS**  Functional Assessment Tool Used clinical judgement based on review of medical record  Functional Limitation Mobility: Walking and moving around  Mobility: Walking and Moving Around Current Status ([id number](G8978) CK  Mobility: Walking and Moving Around Goal Status [phone](G8979) Cristy FolksJ    Deaisha Welborn, PT  [phone] 05/31/2015

## 2015-06-02 ENCOUNTER — Encounter (HOSPITAL_COMMUNITY): Payer: Self-pay | Admitting: Emergency Medicine

## 2015-06-02 ENCOUNTER — Inpatient Hospital Stay (HOSPITAL_COMMUNITY)
Admission: EM | Admit: 2015-06-02 | Discharge: 2015-06-10 | DRG: 239 | Disposition: A | Discharge status: Skilled Nursing Facility | Payer: Medicare Other | Attending: Internal Medicine | Admitting: Internal Medicine

## 2015-06-02 ENCOUNTER — Inpatient Hospital Stay (HOSPITAL_COMMUNITY): Payer: Medicare Other

## 2015-06-02 DIAGNOSIS — I5033 Acute on chronic diastolic (congestive) heart failure: Secondary | ICD-10-CM | POA: Diagnosis present

## 2015-06-02 DIAGNOSIS — I70261 Atherosclerosis of native arteries of extremities with gangrene, right leg: Secondary | ICD-10-CM | POA: Diagnosis present

## 2015-06-02 DIAGNOSIS — Y95 Nosocomial condition: Secondary | ICD-10-CM | POA: Diagnosis not present

## 2015-06-02 DIAGNOSIS — Z515 Encounter for palliative care: Secondary | ICD-10-CM | POA: Insufficient documentation

## 2015-06-02 DIAGNOSIS — Z681 Body mass index (BMI) 19 or less, adult: Secondary | ICD-10-CM | POA: Diagnosis not present

## 2015-06-02 DIAGNOSIS — I998 Other disorder of circulatory system: Secondary | ICD-10-CM | POA: Diagnosis present

## 2015-06-02 DIAGNOSIS — K219 Gastro-esophageal reflux disease without esophagitis: Secondary | ICD-10-CM | POA: Diagnosis present

## 2015-06-02 DIAGNOSIS — M349 Systemic sclerosis, unspecified: Secondary | ICD-10-CM | POA: Diagnosis present

## 2015-06-02 DIAGNOSIS — J690 Pneumonitis due to inhalation of food and vomit: Secondary | ICD-10-CM | POA: Diagnosis not present

## 2015-06-02 DIAGNOSIS — M7989 Other specified soft tissue disorders: Secondary | ICD-10-CM | POA: Diagnosis present

## 2015-06-02 DIAGNOSIS — Z87891 Personal history of nicotine dependence: Secondary | ICD-10-CM | POA: Diagnosis not present

## 2015-06-02 DIAGNOSIS — Z89512 Acquired absence of left leg below knee: Secondary | ICD-10-CM

## 2015-06-02 DIAGNOSIS — Z7189 Other specified counseling: Secondary | ICD-10-CM | POA: Diagnosis not present

## 2015-06-02 DIAGNOSIS — L89152 Pressure ulcer of sacral region, stage 2: Secondary | ICD-10-CM | POA: Diagnosis present

## 2015-06-02 DIAGNOSIS — G894 Chronic pain syndrome: Secondary | ICD-10-CM | POA: Diagnosis present

## 2015-06-02 DIAGNOSIS — Z7902 Long term (current) use of antithrombotics/antiplatelets: Secondary | ICD-10-CM

## 2015-06-02 DIAGNOSIS — I739 Peripheral vascular disease, unspecified: Secondary | ICD-10-CM | POA: Diagnosis present

## 2015-06-02 DIAGNOSIS — Z89021 Acquired absence of right finger(s): Secondary | ICD-10-CM

## 2015-06-02 DIAGNOSIS — R1313 Dysphagia, pharyngeal phase: Secondary | ICD-10-CM | POA: Diagnosis present

## 2015-06-02 DIAGNOSIS — R627 Adult failure to thrive: Secondary | ICD-10-CM | POA: Diagnosis present

## 2015-06-02 DIAGNOSIS — T17908A Unspecified foreign body in respiratory tract, part unspecified causing other injury, initial encounter: Secondary | ICD-10-CM

## 2015-06-02 DIAGNOSIS — Z66 Do not resuscitate: Secondary | ICD-10-CM | POA: Diagnosis present

## 2015-06-02 DIAGNOSIS — Z79891 Long term (current) use of opiate analgesic: Secondary | ICD-10-CM

## 2015-06-02 DIAGNOSIS — J9601 Acute respiratory failure with hypoxia: Secondary | ICD-10-CM | POA: Diagnosis not present

## 2015-06-02 DIAGNOSIS — E785 Hyperlipidemia, unspecified: Secondary | ICD-10-CM | POA: Diagnosis present

## 2015-06-02 DIAGNOSIS — L03115 Cellulitis of right lower limb: Secondary | ICD-10-CM | POA: Diagnosis present

## 2015-06-02 DIAGNOSIS — D62 Acute posthemorrhagic anemia: Secondary | ICD-10-CM | POA: Diagnosis not present

## 2015-06-02 DIAGNOSIS — K225 Diverticulum of esophagus, acquired: Secondary | ICD-10-CM | POA: Diagnosis present

## 2015-06-02 DIAGNOSIS — D638 Anemia in other chronic diseases classified elsewhere: Secondary | ICD-10-CM | POA: Diagnosis present

## 2015-06-02 DIAGNOSIS — R509 Fever, unspecified: Secondary | ICD-10-CM

## 2015-06-02 DIAGNOSIS — E43 Unspecified severe protein-calorie malnutrition: Secondary | ICD-10-CM | POA: Diagnosis not present

## 2015-06-02 DIAGNOSIS — I480 Paroxysmal atrial fibrillation: Secondary | ICD-10-CM | POA: Diagnosis not present

## 2015-06-02 DIAGNOSIS — I10 Essential (primary) hypertension: Secondary | ICD-10-CM | POA: Diagnosis present

## 2015-06-02 DIAGNOSIS — R0989 Other specified symptoms and signs involving the circulatory and respiratory systems: Secondary | ICD-10-CM

## 2015-06-02 DIAGNOSIS — I96 Gangrene, not elsewhere classified: Secondary | ICD-10-CM | POA: Diagnosis present

## 2015-06-02 DIAGNOSIS — Z7982 Long term (current) use of aspirin: Secondary | ICD-10-CM

## 2015-06-02 DIAGNOSIS — I5032 Chronic diastolic (congestive) heart failure: Secondary | ICD-10-CM | POA: Diagnosis present

## 2015-06-02 DIAGNOSIS — L8990 Pressure ulcer of unspecified site, unspecified stage: Secondary | ICD-10-CM | POA: Insufficient documentation

## 2015-06-02 DIAGNOSIS — I70229 Atherosclerosis of native arteries of extremities with rest pain, unspecified extremity: Secondary | ICD-10-CM | POA: Diagnosis present

## 2015-06-02 DIAGNOSIS — R0602 Shortness of breath: Secondary | ICD-10-CM

## 2015-06-02 DIAGNOSIS — M79673 Pain in unspecified foot: Secondary | ICD-10-CM

## 2015-06-02 LAB — HEPATIC FUNCTION PANEL
ALBUMIN: 2.8 g/dL — AB (ref 3.5–5.0)
ALK PHOS: 57 U/L (ref 38–126)
ALT: 13 U/L — AB (ref 14–54)
AST: 23 U/L (ref 15–41)
Bilirubin, Direct: 0.2 mg/dL (ref 0.1–0.5)
Indirect Bilirubin: 0.4 mg/dL (ref 0.3–0.9)
TOTAL PROTEIN: 6.7 g/dL (ref 6.5–8.1)
Total Bilirubin: 0.6 mg/dL (ref 0.3–1.2)

## 2015-06-02 LAB — URINALYSIS, ROUTINE W REFLEX MICROSCOPIC
Bilirubin Urine: NEGATIVE
GLUCOSE, UA: NEGATIVE mg/dL
HGB URINE DIPSTICK: NEGATIVE
Ketones, ur: NEGATIVE mg/dL
Leukocytes, UA: NEGATIVE
Nitrite: NEGATIVE
PH: 7.5 (ref 5.0–8.0)
Protein, ur: NEGATIVE mg/dL
SPECIFIC GRAVITY, URINE: 1.01 (ref 1.005–1.030)

## 2015-06-02 LAB — MRSA PCR SCREENING: MRSA BY PCR: NEGATIVE

## 2015-06-02 LAB — CBC WITH DIFFERENTIAL/PLATELET
Basophils Absolute: 0 10*3/uL (ref 0.0–0.1)
Basophils Relative: 0 %
EOS ABS: 0.1 10*3/uL (ref 0.0–0.7)
EOS PCT: 1 %
HCT: 33.1 % — ABNORMAL LOW (ref 36.0–46.0)
Hemoglobin: 10 g/dL — ABNORMAL LOW (ref 12.0–15.0)
LYMPHS ABS: 0.9 10*3/uL (ref 0.7–4.0)
LYMPHS PCT: 18 %
MCH: 29 pg (ref 26.0–34.0)
MCHC: 30.2 g/dL (ref 30.0–36.0)
MCV: 95.9 fL (ref 78.0–100.0)
MONO ABS: 0.3 10*3/uL (ref 0.1–1.0)
Monocytes Relative: 7 %
Neutro Abs: 3.5 10*3/uL (ref 1.7–7.7)
Neutrophils Relative %: 74 %
PLATELETS: 152 10*3/uL (ref 150–400)
RBC: 3.45 MIL/uL — ABNORMAL LOW (ref 3.87–5.11)
RDW: 14.7 % (ref 11.5–15.5)
WBC: 4.8 10*3/uL (ref 4.0–10.5)

## 2015-06-02 LAB — BASIC METABOLIC PANEL
Anion gap: 8 (ref 5–15)
BUN: 6 mg/dL (ref 6–20)
CHLORIDE: 100 mmol/L — AB (ref 101–111)
CO2: 28 mmol/L (ref 22–32)
CREATININE: 0.43 mg/dL — AB (ref 0.44–1.00)
Calcium: 8.8 mg/dL — ABNORMAL LOW (ref 8.9–10.3)
GFR calc Af Amer: >60 mL/min (ref >60)
GLUCOSE: 87 mg/dL (ref 65–99)
POTASSIUM: 4.1 mmol/L (ref 3.5–5.1)
SODIUM: 136 mmol/L (ref 135–145)

## 2015-06-02 MED ORDER — ONDANSETRON 4 MG PO TBDP
4.0000 mg | ORAL_TABLET | Freq: Three times a day (TID) | ORAL | Status: DC | PRN
  Filled 2015-06-02: qty 1

## 2015-06-02 MED ORDER — VANCOMYCIN HCL IN DEXTROSE 1-5 GM/200ML-% IV SOLN: 1000.0000 mg | Freq: Once | INTRAVENOUS | Status: DC

## 2015-06-02 MED ORDER — ACETAMINOPHEN 325 MG PO TABS
650.0000 mg | ORAL_TABLET | Freq: Four times a day (QID) | ORAL | Status: DC | PRN
  Administered 2015-06-09: 650 mg via ORAL
  Filled 2015-06-02: qty 2

## 2015-06-02 MED ORDER — CLOPIDOGREL BISULFATE 75 MG PO TABS
75.0000 mg | ORAL_TABLET | Freq: Every day | ORAL | Status: DC
  Administered 2015-06-02 – 2015-06-07 (×5): 75 mg via ORAL
  Filled 2015-06-02 (×5): qty 1

## 2015-06-02 MED ORDER — POLYETHYLENE GLYCOL 3350 17 G PO PACK: 17.0000 g | PACK | Freq: Every day | ORAL | Status: DC | PRN

## 2015-06-02 MED ORDER — ENOXAPARIN SODIUM 30 MG/0.3ML ~~LOC~~ SOLN
30.0000 mg | SUBCUTANEOUS | Status: DC
  Administered 2015-06-02 – 2015-06-07 (×5): 30 mg via SUBCUTANEOUS
  Filled 2015-06-02 (×5): qty 0.3

## 2015-06-02 MED ORDER — PREGABALIN 50 MG PO CAPS
50.0000 mg | ORAL_CAPSULE | Freq: Three times a day (TID) | ORAL | Status: DC
  Administered 2015-06-02 – 2015-06-10 (×22): 50 mg via ORAL
  Filled 2015-06-02: qty 1
  Filled 2015-06-02: qty 2
  Filled 2015-06-02: qty 1
  Filled 2015-06-02: qty 2
  Filled 2015-06-02 (×8): qty 1
  Filled 2015-06-02: qty 2
  Filled 2015-06-02 (×3): qty 1
  Filled 2015-06-02: qty 2
  Filled 2015-06-02 (×2): qty 1
  Filled 2015-06-02: qty 2
  Filled 2015-06-02 (×2): qty 1
  Filled 2015-06-02: qty 2

## 2015-06-02 MED ORDER — DOCUSATE SODIUM 100 MG PO CAPS
100.0000 mg | ORAL_CAPSULE | Freq: Two times a day (BID) | ORAL | Status: DC
  Administered 2015-06-02 – 2015-06-08 (×13): 100 mg via ORAL
  Filled 2015-06-02 (×13): qty 1

## 2015-06-02 MED ORDER — PRAVASTATIN SODIUM 40 MG PO TABS
80.0000 mg | ORAL_TABLET | Freq: Every day | ORAL | Status: DC
  Administered 2015-06-02 – 2015-06-09 (×7): 80 mg via ORAL
  Filled 2015-06-02: qty 2
  Filled 2015-06-02: qty 1
  Filled 2015-06-02 (×3): qty 2
  Filled 2015-06-02: qty 1
  Filled 2015-06-02 (×3): qty 2

## 2015-06-02 MED ORDER — PANTOPRAZOLE SODIUM 40 MG PO TBEC
40.0000 mg | DELAYED_RELEASE_TABLET | Freq: Every day | ORAL | Status: DC
  Administered 2015-06-02 – 2015-06-10 (×7): 40 mg via ORAL
  Filled 2015-06-02 (×8): qty 1

## 2015-06-02 MED ORDER — ACETAMINOPHEN 325 MG PO TABS: 650.0000 mg | ORAL_TABLET | Freq: Four times a day (QID) | ORAL | Status: DC | PRN

## 2015-06-02 MED ORDER — OXYCODONE HCL 5 MG PO TABS
10.0000 mg | ORAL_TABLET | Freq: Once | ORAL | Status: AC
  Administered 2015-06-02: 10 mg via ORAL
  Filled 2015-06-02: qty 2

## 2015-06-02 MED ORDER — ASPIRIN EC 81 MG PO TBEC
81.0000 mg | DELAYED_RELEASE_TABLET | Freq: Every day | ORAL | Status: DC
  Administered 2015-06-02 – 2015-06-07 (×5): 81 mg via ORAL
  Filled 2015-06-02 (×5): qty 1

## 2015-06-02 MED ORDER — OXYCODONE HCL 5 MG PO TABS
5.0000 mg | ORAL_TABLET | ORAL | Status: DC | PRN
  Administered 2015-06-02 – 2015-06-04 (×7): 10 mg via ORAL
  Administered 2015-06-04: 5 mg via ORAL
  Administered 2015-06-05: 10 mg via ORAL
  Administered 2015-06-05: 5 mg via ORAL
  Administered 2015-06-06: 10 mg via ORAL
  Administered 2015-06-06 – 2015-06-07 (×2): 5 mg via ORAL
  Administered 2015-06-07: 10 mg via ORAL
  Administered 2015-06-08: 5 mg via ORAL
  Filled 2015-06-02: qty 2
  Filled 2015-06-02: qty 1
  Filled 2015-06-02: qty 2
  Filled 2015-06-02: qty 1
  Filled 2015-06-02: qty 2
  Filled 2015-06-02: qty 1
  Filled 2015-06-02 (×5): qty 2
  Filled 2015-06-02: qty 1
  Filled 2015-06-02: qty 2
  Filled 2015-06-02: qty 1
  Filled 2015-06-02: qty 2

## 2015-06-02 MED ORDER — AMLODIPINE BESYLATE 5 MG PO TABS
5.0000 mg | ORAL_TABLET | Freq: Every day | ORAL | Status: DC
  Administered 2015-06-02 – 2015-06-05 (×3): 5 mg via ORAL
  Filled 2015-06-02 (×3): qty 1

## 2015-06-02 MED ORDER — ALUM & MAG HYDROXIDE-SIMETH 200-200-20 MG/5ML PO SUSP: 30.0000 mL | Freq: Four times a day (QID) | ORAL | Status: DC | PRN

## 2015-06-02 MED ORDER — VANCOMYCIN HCL 500 MG IV SOLR
500.0000 mg | Freq: Once | INTRAVENOUS | Status: AC
  Administered 2015-06-02: 500 mg via INTRAVENOUS
  Filled 2015-06-02: qty 500

## 2015-06-02 MED ORDER — BENAZEPRIL HCL 20 MG PO TABS
20.0000 mg | ORAL_TABLET | Freq: Every day | ORAL | Status: DC
  Administered 2015-06-02 – 2015-06-05 (×3): 20 mg via ORAL
  Filled 2015-06-02 (×4): qty 1

## 2015-06-02 MED ORDER — VANCOMYCIN HCL 500 MG IV SOLR
500.0000 mg | INTRAVENOUS | Status: DC
  Administered 2015-06-03 – 2015-06-04 (×2): 500 mg via INTRAVENOUS
  Filled 2015-06-02 (×3): qty 500

## 2015-06-02 MED ORDER — ENSURE ENLIVE PO LIQD
237.0000 mL | Freq: Two times a day (BID) | ORAL | Status: DC
  Administered 2015-06-03: 237 mL via ORAL

## 2015-06-02 MED ORDER — AMLODIPINE BESY-BENAZEPRIL HCL 5-20 MG PO CAPS: 1.0000 | ORAL_CAPSULE | Freq: Every day | ORAL | Status: DC

## 2015-06-02 MED ORDER — MORPHINE SULFATE ER 15 MG PO TBCR
15.0000 mg | EXTENDED_RELEASE_TABLET | Freq: Two times a day (BID) | ORAL | Status: DC
  Administered 2015-06-02 – 2015-06-09 (×15): 15 mg via ORAL
  Filled 2015-06-02 (×15): qty 1

## 2015-06-02 MED ORDER — ACETAMINOPHEN 650 MG RE SUPP: 650.0000 mg | Freq: Four times a day (QID) | RECTAL | Status: DC | PRN

## 2015-06-02 NOTE — Progress Notes (Signed)
NURSING PROGRESS NOTE  [facility] M [facility] [id number] Admission Data: 06/02/2015 1:38 PM Attending Provider: Alba CoryBelkys A Regalado, MD NWG:NFAOZHY,QMVPCP:MOREIRA,ROY, MD Code Status: DNR  Allergies:  Review of patient's allergies indicates no known allergies. Past Medical History:   has a past medical history of Hypertension; GERD (gastroesophageal reflux disease); Lupus (HCC); Scleroderma (HCC); Arthritis; Peripheral vascular disease (HCC); PAD (peripheral artery disease) (HCC); Heart murmur; and Chronic diastolic (congestive) heart failure (HCC). Past Surgical History:   has past surgical history that includes Abdominal hysterectomy; Coronary angioplasty; Sympathectomy (Right, 01/27/2015); Amputation (Right, 01/27/2015); Cardiac catheterization (N/A, 02/15/2015); Cardiac catheterization (Left, 04/12/2015); Cardiac catheterization (Left, 04/12/2015); Amputation (Left, 04/20/2015); Peripheral arterial stent graft (Left, 05/17/2015); and Cardiac catheterization (N/A, 05/17/2015). Social History:   reports that she quit smoking about 5 months ago. Her smoking use included Cigarettes. She has a 15 pack-year smoking history. She has never used smokeless tobacco. She reports that she drinks alcohol. She reports that she does not use illicit drugs.  Sh[facility] SkeetersJohnetta M [facility] is a 81 y.o. female patient admitted from ED:   Last Documented Vital Signs: Blood pressure 163/52, pulse 71, temperature 98.6 F (37 C), temperature source Oral, resp. rate 22, height 5\' 2"  (1.575 m), SpO2 100 %.  Cardiac Monitoring: none  IV Fluids:  IV in place, occlusive dsg intact without redness, IV cath forearm right, condition patent and no redness none.   Skin: Right foot discolored, sacral area with small stage 2 pressure ulcer  Patient/Family orientated to room. Information packet given to patient/family. Admission inpatient armband information verified with patient/family to include name and date of birth and placed on patient arm. Side rails up  x 2, fall assessment and education completed with patient/family. Patient/family able to verbalize understanding of risk associated with falls and verbalized understanding to call for assistance before getting out of bed. Call light within reach. Patient/family able to voice and demonstrate understanding of unit orientation instructions.    Will continue to evaluate and treat per MD orders.   Madelin RearLonnie Aziza Stuckert, MSN, RN, [employer]RN

## 2015-06-02 NOTE — ED Notes (Signed)
Pt coming from [facility] noticed R leg swelling yesterday. Swollen more than normal. Tight skin on the right side. Chronic pain. Staff states that leg is more swollen more than normal. Denies increased pain.  DNR form at bedside  BKA on left leg. Right foot bandaged.

## 2015-06-02 NOTE — H&P (Signed)
[facility] History and Physical                                                                                    Shannon Rice, is a 81 y.o. female  MRN: [id number]   DOB - 1931-02-25  Admit Date - 06/02/2015  Outpatient Primary MD for the patient is Ralene OkMOREIRA,ROY, MD  Referring Physician:  Trixie DredgeEmily West, PA-C  Chief Complaint:   Chief Complaint  Patient presents with  . Leg Swelling    right     HPI  Shannon IdlerJohnetta Bethard  is a 81 y.o. female, with lupus, scleroderma, diastolic heart failure and severe peripheral artery disease status post left AKA and has had multiple peripheral vascular catheterization catheterizations for critical limb ischemia (05/17/2015). She presents from [facility] skilled nursing for increasing redness and pain in the leg above her right lower extremity gangrenous toes.  The patient is mildly, but pleasantly confused and is not a good historian. Her daughter explains that she was [facility] this [facility] due to redness. The patient complains that she always has pain in the leg but very recently has been worse than usual. She denies anorexia, changes in bowel habits, nausea, vomiting, chest pain, difficulty breathing, cough, sore throat.  The patient also has secondary complaints of urinary frequency, increased redness (capillaries) on her head, and soreness on her bottom.  She was most recently discharged [facility] on [facility]16 after being treated for anemia and receiving 2 units of packed red blood cells.   In the emergency department. The patient's labs appear generally stable.  Urine is pending.  Blood cultures were not obtained.  EDP was unable to obtain a pulse in her right foot.  Review of Systems  Constitutional: Negative for fever and chills.  HENT: Negative.   Eyes: Negative.   Respiratory: Negative.   Cardiovascular: Positive for chest pain. Negative for orthopnea and leg swelling.  Gastrointestinal: Negative.    Genitourinary: Negative.   Musculoskeletal: Negative.   Skin: Positive for rash.  Neurological: Negative.   Endo/Heme/Allergies: Negative.   Psychiatric/Behavioral: Negative.      Past Medical History  Past Medical History  Diagnosis Date  . Hypertension   . GERD (gastroesophageal reflux disease)   . Lupus (HCC)     skin, scleraderma  . Scleroderma (HCC)   . Arthritis     scleroderma  . Peripheral vascular disease (HCC)   . PAD (peripheral artery disease) (HCC)   . Heart murmur   . Chronic diastolic (congestive) heart failure [facility](HCC)     Past Surgical History  Procedure Laterality Date  . Abdominal hysterectomy    . Coronary angioplasty      Dr Jenne CampusMcQueen  . Sympathectomy Right 01/27/2015    Procedure: RIGHT RADICAL ARTERY SYMPATHECTOMY, RIGHT ULNAR ARTERY SYMPATHECTOMY, DIGITAL SYMPATHECTOMY RIGHT INDEX, MIDDLE, RING SMALL;  Surgeon: Cindee SaltGary Kuzma, MD;  Location: [facility];  Service: Orthopedics;  Laterality: Right;  . Amputation Right 01/27/2015    Procedure: AMPUTATION DISTAL TIP RIGHT INDEX FINGER;  Surgeon: Cindee SaltGary Kuzma, MD;  Location: [facility];  Service: Orthopedics;  Laterality: Right;  . Peripheral vascular catheterization  N/A 02/15/2015    Procedure: Lower Extremity Angiography;  Surgeon: [name], MD;  Location: [facility] INVASIVE CV LAB;  Service: Cardiovascular;  Laterality: N/A;  . Peripheral vascular catheterization Left 04/12/2015    Procedure: Lower Extremity Angiography;  Surgeon: [name], MD;  Location: [facility] INVASIVE CV LAB;  Service: Cardiovascular;  Laterality: Left;  . Peripheral vascular catheterization Left 04/12/2015    Procedure: Peripheral Vascular Atherectomy;  Surgeon: [name], MD;  Location: [facility] INVASIVE CV LAB;  Service: Cardiovascular;  Laterality: Left;  sfa     . Amputation Left 04/20/2015    Procedure: Left Below Knee Amputation;  Surgeon: Nadara Mustard, MD;  Location: [facility] OR;  Service: Orthopedics;  Laterality: Left;  .  Peripheral arterial stent graft Left 05/17/2015  . Peripheral vascular catheterization N/A 05/17/2015    Procedure: Lower Extremity Intervention;  Surgeon: [name], MD;  Location: [facility] INVASIVE CV LAB;  Service: Cardiovascular;  Laterality: N/A;      Social History Social History  Substance Use Topics  . Smoking status: Former Smoker -- 0.25 packs/day for 60 years    Types: Cigarettes    Quit date: 12/12/2014  . Smokeless tobacco: Never Used  . Alcohol Use: Yes     Comment: glass wine on special occasions  Her primary caretaker is her daughter. They plan to live together once she is discharged from SNF  Family History Family History  Problem Relation Age of Onset  . Diabetes Sister   . Diabetes Brother     Prior to Admission medications   Medication Sig Start Date End Date Taking? Authorizing Provider  acetaminophen (TYLENOL) 325 MG tablet Take 2 tablets (650 mg total) by mouth every 6 (six) hours as needed for mild pain (or Fever >/= 101). 04/22/15  Yes Dawood S Elgergawy, MD  amLODipine-benazepril (LOTREL) 5-20 MG per capsule Take 1 capsule by mouth daily.   Yes Historical Provider, MD  aspirin EC 81 MG EC tablet Take 1 tablet (81 mg total) by mouth daily. 04/13/15  Yes [name], NP  clopidogrel (PLAVIX) 75 MG tablet Take 1 tablet (75 mg total) by mouth daily. 04/22/15  Yes Leana Roe Elgergawy, MD  feeding supplement, ENSURE ENLIVE, (ENSURE ENLIVE) LIQD Take 237 mLs by mouth 2 (two) times daily between meals. 05/25/15  Yes Hollice Espy, MD  morphine (MS CONTIN) 15 MG 12 hr tablet Take 1 tablet (15 mg total) by mouth every 12 (twelve) hours. 05/25/15  Yes Hollice Espy, MD  omeprazole (PRILOSEC) 20 MG capsule Take 1 capsule (20 mg total) by mouth daily. 05/25/15  Yes Hollice Espy, MD  ondansetron (ZOFRAN ODT) 4 MG disintegrating tablet Take 1 tablet (4 mg total) by mouth every 8 (eight) hours as needed for nausea or vomiting. 04/13/15  Yes [name], NP   oxyCODONE (ROXICODONE) 5 MG immediate release tablet Take 1-2 tablets (5-10 mg total) by mouth every 4 (four) hours as needed for severe pain. 05/05/15  Yes Tiffany L Reed, DO  Pitavastatin Calcium (LIVALO) 2 MG TABS Take 2 mg by mouth 2 (two) times daily.   Yes Historical Provider, MD  polyethylene glycol (MIRALAX / GLYCOLAX) packet Take 17 g by mouth daily as needed for moderate constipation. 04/22/15  Yes Starleen Arms, MD  pregabalin (LYRICA) 50 MG capsule Take 1 capsule (50 mg total) by mouth 3 (three) times daily. 04/27/15  Yes Sharon Seller, NP    No Known Allergies  Physical Exam  Vitals  Blood pressure  163/52, pulse 71, temperature 98.6 F (37 C), temperature source Oral, resp. rate 22, height 5\' 2"  (1.575 m), SpO2 100 %.   General: Pleasant, mildly confused, elderly female  lying in bed in NAD, Family bedside  Psych:  Not Suicidal or Homicidal, Awake Alert, Oriented X 3.  Neuro:   No F.N deficits, ALL C.Nerves Intact,   ENT:  Ears and Eyes appear Normal, Conjunctivae clear, PER. Moist oral mucosa without erythema or exudates.  Neck:  Supple, No lymphadenopathy appreciated  Respiratory:  Symmetrical chest wall movement, Good air movement bilaterally, CTAB.  Cardiac:  RRR, No Murmurs, no LE edema noted, no JVD.    Abdomen:  Positive bowel sounds, Soft, Non tender, Non distended,  No masses appreciated  Skin:  No Cyanosis, Normal Skin Turgor, No Skin Rash or Bruise.  Extremities:  Able to move all 4. 5/5 strength in each,  no effusions. Left AKA. Right lower extremity has mild erythema in the tibial area, toes are black with gangrene. Dried white pus is surrounding her toes  Data Review  Wt Readings from Last 3 Encounters:  05/24/15 33.702 kg (74 lb 4.8 oz)  05/19/15 39.4 kg (86 lb 13.8 oz)  04/21/15 43.5 kg (95 lb 14.4 oz)    CBC  Recent Labs Lab 06/02/15 0620  WBC 4.8  HGB 10.0*  HCT 33.1*  PLT 152  MCV 95.9  MCH 29.0  MCHC 30.2  RDW 14.7   LYMPHSABS 0.9  MONOABS 0.3  EOSABS 0.1  BASOSABS 0.0    Chemistries   Recent Labs Lab 06/02/15 0620  NA 136  K 4.1  CL 100*  CO2 28  GLUCOSE 87  BUN 6  CREATININE 0.43*  CALCIUM 8.8*  AST 23  ALT 13*  ALKPHOS 57  BILITOT 0.6       Urinalysis: Pending  Imaging results:   Ct Abdomen Pelvis Wo Contrast  05/25/2015  CLINICAL DATA:  Acute onset of dizziness. Anemia. Assess for retroperitoneal bleed. Initial encounter. EXAM: CT ABDOMEN AND PELVIS WITHOUT CONTRAST TECHNIQUE: Multidetector CT imaging of the abdomen and pelvis was performed following the standard protocol without IV contrast. COMPARISON:  CT of the abdomen and pelvis performed 08/24/2010 FINDINGS: There is no evidence of retroperitoneal hemorrhage. Mild diffuse soft tissue edema is noted about the abdomen and pelvis, likely reflecting mild anasarca. Trace bilateral pleural fluid is noted, with bibasilar atelectasis. Mild biatrial enlargement is noted. Diffuse coronary artery calcifications are seen. The liver and spleen are unremarkable in appearance. The gallbladder is within normal limits. The pancreas and adrenal glands are unremarkable. A few small hyperdense foci within the kidneys may reflect small hyperdense cysts. There is no evidence of hydronephrosis. A 2 mm stone is noted at the upper pole of the right kidney. No obstructing ureteral stones are seen. No perinephric stranding is appreciated. No free fluid is identified. The small bowel is unremarkable in appearance. The stomach is within normal limits. No acute vascular abnormalities are seen. Diffuse calcification is noted along the abdominal aorta and its branches. The appendix is normal in caliber, without evidence of appendicitis. The colon is unremarkable in appearance. The bladder is significantly distended and grossly unremarkable. The patient is status post hysterectomy. No suspicious adnexal masses are seen. No inguinal lymphadenopathy is seen. No acute  osseous abnormalities are identified. A chronic right-sided pars defect is noted at L5. IMPRESSION: 1. No evidence of acute hemorrhage. 2. Mild diffuse soft tissue edema about the abdomen and pelvis, likely reflecting mild anasarca.  3. Trace bilateral pleural fluid, with bibasilar atelectasis. 4. Mild biatrial enlargement noted. 5. Diffuse coronary artery calcifications seen. 6. Diffuse calcification along the abdominal aorta and branches. 7. Few small hyperdense foci within the kidneys may reflect small hyperdense cysts. 2 mm nonobstructing stone at the upper pole of the right kidney. 8. Chronic right-sided pars defect at L5. Electronically Signed   By: Roanna Raider M.D.   On: 05/25/2015 02:55   Dg Foot Complete Right  06/02/2015  CLINICAL DATA:  Gangrene of the foot. EXAM: RIGHT FOOT COMPLETE - 3+ VIEW COMPARISON:  None. FINDINGS: There is no fracture or dislocation. No bone destruction to suggest osteomyelitis. Soft tissue swelling of the dorsum of the foot. Arterial vascular calcifications. IMPRESSION: No acute osseous abnormality.  Osteopenia. Electronically Signed   By: Francene Boyers M.D.   On: 06/02/2015 08:55    My personal review of EKG: No EKG performed in the ER.   Assessment & Plan  Principal Problem:   Cellulitis of right foot Active Problems:   Gangrene of foot (HCC)   Scleroderma (HCC)   Peripheral artery disease (HCC)   Critical lower limb ischemia   HTN (hypertension)   Protein-calorie malnutrition, severe (HCC)   HLD (hyperlipidemia)   Chronic diastolic (congestive) heart failure (HCC)   Pressure ulcer    Acute right lower extremity cellulitis in the setting of known gangrene in the distal digits Unable to obtain pulse in the ER this morning. Will place on IV vancomycin. Check lower extremity Doppler and ABI.  Would obtain bld cultures if she spikes a temperature.  Wound care and nutrition consultation. Discussed with Dr. Nadara Eaton who recommends IV antibiotics for a  few days and discharge.  He anticipates the toes will auto amputate.  Will consult Dr. Lajoyce Corners as well.   Severe peripheral artery disease. On aspirin, Plavix.  Will continue these.     Chronic Diastolic Heart Failure Patient appears euvolemic.  Will monitor closely for volume overload.   Chronic pain Continue MS Contin, oxycodone, Lyrica.   Dyslipidemia Continue Livalo.    Consultants Called:    Dr. Nadara Eaton, Dr. Lajoyce Corners.  Family Communication:     Family at bedside  Code Status:    DO NOT RESUSCITATE  Condition:    Guarded  Potential Disposition:   Likely SNF after cleared by Dr. Nadara Eaton and workup is complete  Time spent in minutes : 60   [facility] Algis Downs,  [place] on 06/02/2015 at 10:33 AM Between 7am to 7pm - Pager - [phone] After 7pm go to [email or web address] - password [email or web address] And look for the night coverage person covering me after hours

## 2015-06-02 NOTE — Progress Notes (Signed)
ANTIBIOTIC CONSULT NOTE - INITIAL  Pharmacy Consult for Vancomycin  Indication: Cellulitis   No Known Allergies  Patient Measurements: Height: 5\' 2"  (157.5 cm) IBW/kg (Calculated) : 50.1  Vital Signs: Temp: 98.6 F (37 C) (11/24 0716) Temp Source: Oral (11/24 0716) BP: 163/52 mmHg (11/24 0904) Pulse Rate: 71 (11/24 0904) Intake/Output from previous day:   Intake/Output from this shift:    Labs:  Recent Labs  06/02/15 0620  WBC 4.8  HGB 10.0*  PLT 152  CREATININE 0.43*   Estimated Creatinine Clearance: 28.8 mL/min (by C-G formula based on Cr of 0.43). No results for input(s): VANCOTROUGH, VANCOPEAK, VANCORANDOM, GENTTROUGH, GENTPEAK, GENTRANDOM, TOBRATROUGH, TOBRAPEAK, TOBRARND, AMIKACINPEAK, AMIKACINTROU, AMIKACIN in the last 72 hours.   Microbiology: Recent Results (from the past 720 hour(s))  MRSA PCR Screening     Status: None   Collection Time: 05/25/15  4:33 AM  Result Value Ref Range Status   MRSA by PCR NEGATIVE NEGATIVE Final    Comment:        The GeneXpert MRSA Assay (FDA approved for NASAL specimens only), is one component of a comprehensive MRSA colonization surveillance program. It is not intended to diagnose MRSA infection nor to guide or monitor treatment for MRSA infections.     Medical History: Past Medical History  Diagnosis Date  . Hypertension   . GERD (gastroesophageal reflux disease)   . Lupus (HCC)     skin, scleraderma  . Scleroderma (HCC)   . Arthritis     scleroderma  . Peripheral vascular disease (HCC)   . PAD (peripheral artery disease) (HCC)   . Heart murmur   . Chronic diastolic (congestive) heart failure [facility](HCC)     Assessment: [age] YOF admitted 06/02/2015 from nursing home with c/o dizziess and lightheadedness and hgb 6.1.  Pharmacy to dose Vancomycin for gangrenous distal foot and toes of right foot. WBC 4.8, Tmax 98.6. No blood cultures  11/24 Vanc>>  Goal of Therapy:  Vancomycin trough level 15-20  mcg/ml  Plan:  Vancomycin 500mg  IV Q24h  VT level at ss to assess  F/U LOT, clinical course   Calbert Hulsebus C. Marvis MoellerMiles, PharmD Pharmacy Resident  Pager: [phone] 06/02/2015 10:13 AM

## 2015-06-02 NOTE — ED Notes (Signed)
Unable to find pulse on RLE with doppler, [place] aware.

## 2015-06-02 NOTE — ED Notes (Signed)
Patient transported to X-ray 

## 2015-06-02 NOTE — ED Notes (Signed)
Admitting at bedside 

## 2015-06-02 NOTE — ED Provider Notes (Signed)
CSN: [id number]     Arrival date & time 06/02/15  0533 History   First MD Initiated Contact with Patient 06/02/15 0557     Chief Complaint  Patient presents with  . Leg Swelling    right     (Consider location/radiation/quality/duration/timing/severity/associated sxs/prior Treatment) HPI   Pt with hx PAD, CHF, lupus, PVC, left BKA, recent admission for symptomatic anemia, sent in from [facility] for redness and swelling of her right leg.  Pt states she has chronic pain in her right leg and she has not noticed a change.  Denies any symptoms currently, states she is feeling well.   Patient and family member note the toes on her right foot have been dark for the past weeks to months.   Denies fever, CP, SOB, change in chronic pain of right leg.     Orthopedist - Dr Lajoyce Corners Vascular - Dr Jacinto Halim   Past Medical History  Diagnosis Date  . Hypertension   . GERD (gastroesophageal reflux disease)   . Lupus (HCC)     skin, scleraderma  . Scleroderma (HCC)   . Arthritis     scleroderma  . Peripheral vascular disease (HCC)   . PAD (peripheral artery disease) (HCC)   . Heart murmur   . Chronic diastolic (congestive) heart failure [facility])    Past Surgical History  Procedure Laterality Date  . Abdominal hysterectomy    . Coronary angioplasty      Dr [name]  . Sympathectomy Right 01/27/2015    Procedure: RIGHT RADICAL ARTERY SYMPATHECTOMY, RIGHT ULNAR ARTERY SYMPATHECTOMY, DIGITAL SYMPATHECTOMY RIGHT INDEX, MIDDLE, RING SMALL;  Surgeon: [name], MD;  Location: [facility];  Service: Orthopedics;  Laterality: Right;  . Amputation Right 01/27/2015    Procedure: AMPUTATION DISTAL TIP RIGHT INDEX FINGER;  Surgeon: [name], MD;  Location: [facility];  Service: Orthopedics;  Laterality: Right;  . Peripheral vascular catheterization N/A 02/15/2015    Procedure: Lower Extremity Angiography;  Surgeon: [name], MD;  Location: [facility] INVASIVE CV LAB;  Service:  Cardiovascular;  Laterality: N/A;  . Peripheral vascular catheterization Left 04/12/2015    Procedure: Lower Extremity Angiography;  Surgeon: [name], MD;  Location: [facility] INVASIVE CV LAB;  Service: Cardiovascular;  Laterality: Left;  . Peripheral vascular catheterization Left 04/12/2015    Procedure: Peripheral Vascular Atherectomy;  Surgeon: [name], MD;  Location: [facility] INVASIVE CV LAB;  Service: Cardiovascular;  Laterality: Left;  sfa     . Amputation Left 04/20/2015    Procedure: Left Below Knee Amputation;  Surgeon: Nadara Mustard, MD;  Location: [facility] OR;  Service: Orthopedics;  Laterality: Left;  . Peripheral arterial stent graft Left 05/17/2015  . Peripheral vascular catheterization N/A 05/17/2015    Procedure: Lower Extremity Intervention;  Surgeon: [name], MD;  Location: [facility] INVASIVE CV LAB;  Service: Cardiovascular;  Laterality: N/A;   Family History  Problem Relation Age of Onset  . Diabetes Sister   . Diabetes Brother    Social History  Substance Use Topics  . Smoking status: Former Smoker -- 0.25 packs/day for 60 years    Types: Cigarettes    Quit date: 12/12/2014  . Smokeless tobacco: Never Used  . Alcohol Use: Yes     Comment: glass wine on special occasions   OB History    No data available     Review of Systems  All other systems reviewed and are negative.     Allergies  Review of patient's allergies indicates  no known allergies.  Home Medications   Prior to Admission medications   Medication Sig Start Date End Date Taking? Authorizing Provider  acetaminophen (TYLENOL) 325 MG tablet Take 2 tablets (650 mg total) by mouth every 6 (six) hours as needed for mild pain (or Fever >/= 101). 04/22/15  Yes Dawood S Elgergawy, MD  amLODipine-benazepril (LOTREL) 5-20 MG per capsule Take 1 capsule by mouth daily.   Yes Historical Provider, MD  aspirin EC 81 MG EC tablet Take 1 tablet (81 mg total) by mouth daily. 04/13/15  Yes [name], NP  clopidogrel (PLAVIX) 75  MG tablet Take 1 tablet (75 mg total) by mouth daily. 04/22/15  Yes Leana Roe Elgergawy, MD  feeding supplement, ENSURE ENLIVE, (ENSURE ENLIVE) LIQD Take 237 mLs by mouth 2 (two) times daily between meals. 05/25/15  Yes Hollice Espy, MD  morphine (MS CONTIN) 15 MG 12 hr tablet Take 1 tablet (15 mg total) by mouth every 12 (twelve) hours. 05/25/15  Yes Hollice Espy, MD  morphine (MS CONTIN) 15 MG 12 hr tablet Take 15 mg by mouth every 12 (twelve) hours.   Yes Historical Provider, MD  omeprazole (PRILOSEC) 20 MG capsule Take 1 capsule (20 mg total) by mouth daily. 05/25/15  Yes Hollice Espy, MD  ondansetron (ZOFRAN ODT) 4 MG disintegrating tablet Take 1 tablet (4 mg total) by mouth every 8 (eight) hours as needed for nausea or vomiting. 04/13/15  Yes [name], NP  oxyCODONE (ROXICODONE) 5 MG immediate release tablet Take 1-2 tablets (5-10 mg total) by mouth every 4 (four) hours as needed for severe pain. 05/05/15  Yes Tiffany L Reed, DO  Pitavastatin Calcium (LIVALO) 2 MG TABS Take 2 mg by mouth 2 (two) times daily.   Yes Historical Provider, MD  polyethylene glycol (MIRALAX / GLYCOLAX) packet Take 17 g by mouth daily as needed for moderate constipation. 04/22/15  Yes Starleen Arms, MD  pregabalin (LYRICA) 50 MG capsule Take 1 capsule (50 mg total) by mouth 3 (three) times daily. 04/27/15  Yes Sharon Seller, NP   BP 157/53 mmHg  Temp(Src) 98.5 F (36.9 C) (Oral)  Resp 16  Ht  (1.575 m)  SpO2 100% Physical Exam  Constitutional: She appears well-developed. She appears cachectic.  Non-toxic appearance. No distress.  HENT:  Head: Normocephalic and atraumatic.  Eyes: Conjunctivae are normal.  Neck: Neck supple.  Cardiovascular: Normal rate and regular rhythm.   Pulmonary/Chest: Effort normal and breath sounds normal. No respiratory distress. She has no wheezes. She has no rales. She exhibits no tenderness.  Abdominal: Soft. She exhibits no distension. There is no  tenderness. There is no rebound and no guarding.  Musculoskeletal:  Right foot without palpable pulse.  Lower leg and foot are warm.    Neurological: She is alert.  Skin: She is not diaphoretic.  Right foot with chronic necrotic wounds on toes and dorsum of foot, per family and patient are unchanged.   Right lower extremity with erythema, edema, warmth.    Nursing note and vitals reviewed.   ED Course  Procedures (including critical care time) Labs Review Labs Reviewed  BASIC METABOLIC PANEL - Abnormal; Notable for the following:    Chloride 100 (*)    Creatinine, Ser 0.43 (*)    Calcium 8.8 (*)    All other components within normal limits  CBC WITH DIFFERENTIAL/PLATELET - Abnormal; Notable for the following:    RBC 3.45 (*)    Hemoglobin 10.0 (*)  HCT 33.1 (*)    All other components within normal limits  HEPATIC FUNCTION PANEL  URINALYSIS, ROUTINE W REFLEX MICROSCOPIC (NOT AT [facility])    Imaging Review No results found. I have personally reviewed and evaluated these images and lab results as part of my medical decision-making.   EKG Interpretation None       7:45 AM Discussed pt with PA [place] who accepts pt for admission to [facility].    Doppler by nurses and by me - unable to find dorsalis pedis or posterior tibialis pulse in right foot.  Discussed pt with Dr Blinda LeatherwoodPollina who has also seen and examined the patient.    MDM   Final diagnoses:  Cellulitis of right lower extremity  Gangrene of foot (HCC)   Afebrile nontoxic patient with hx PVD, PAD sent from rehab facility with new cellulitis of right lower extremity.  Pt has left BKA.  Right toes are necrotic, has open wounds - per family and pt they have been like that for weeks to months.  No distal pulses present.  Cellulitis is new today.  Vancomycin started in ED.  VS normal.  Labs remarkable for improved Hgb (recent admission for symptomatic anemia).  Pt also seen and examined by Dr Blinda LeatherwoodPollina.  [facility] treatment of infection, suspect pt will likely require some level of amputation.     Trixie Dredgemily Chassidy Layson, PA-C 06/02/15 [id number]  Gilda Creasehristopher J Pollina, MD 06/02/15 [phone]

## 2015-06-02 NOTE — ED Notes (Signed)
Family at bedside. 

## 2015-06-02 NOTE — ED Provider Notes (Signed)
Patient presented to the ER with pain, swelling, redness of right leg.  Face to face Exam: HEENT - PERRLA Lungs - CTAB Heart - RRR, no M/R/G Abd - S/NT/ND Neuro - alert, oriented x3 Musculoskeletal - gangrene all 5 toes and distal aspect of right foot, warmth and erythema of lower leg  Plan: Cellulitis in setting of gangrene of distal foot and toes. Admit for IV antibiotics.  Gilda Creasehristopher J Ezelle Surprenant, MD 06/02/15 [phone]

## 2015-06-02 NOTE — ED Notes (Signed)
Attempted report 

## 2015-06-03 LAB — CBC
HCT: 31.4 % — ABNORMAL LOW (ref 36.0–46.0)
Hemoglobin: 9.4 g/dL — ABNORMAL LOW (ref 12.0–15.0)
MCH: 28.8 pg (ref 26.0–34.0)
MCHC: 29.9 g/dL — AB (ref 30.0–36.0)
MCV: 96.3 fL (ref 78.0–100.0)
PLATELETS: 167 10*3/uL (ref 150–400)
RBC: 3.26 MIL/uL — ABNORMAL LOW (ref 3.87–5.11)
RDW: 14.8 % (ref 11.5–15.5)
WBC: 4.5 10*3/uL (ref 4.0–10.5)

## 2015-06-03 LAB — BASIC METABOLIC PANEL
Anion gap: 6 (ref 5–15)
BUN: 10 mg/dL (ref 6–20)
CALCIUM: 8.5 mg/dL — AB (ref 8.9–10.3)
CO2: 29 mmol/L (ref 22–32)
CREATININE: 0.48 mg/dL (ref 0.44–1.00)
Chloride: 103 mmol/L (ref 101–111)
Glucose, Bld: 123 mg/dL — ABNORMAL HIGH (ref 65–99)
Potassium: 3.7 mmol/L (ref 3.5–5.1)
SODIUM: 138 mmol/L (ref 135–145)

## 2015-06-03 LAB — SURGICAL PCR SCREEN
MRSA, PCR: NEGATIVE
STAPHYLOCOCCUS AUREUS: POSITIVE — AB

## 2015-06-03 LAB — TYPE AND SCREEN
ABO/RH(D): AB POS
ANTIBODY SCREEN: NEGATIVE

## 2015-06-03 MED ORDER — ADULT MULTIVITAMIN W/MINERALS CH
1.0000 | ORAL_TABLET | Freq: Every day | ORAL | Status: DC
Start: 2015-06-03 — End: 2015-06-10
  Administered 2015-06-03 – 2015-06-10 (×6): 1 via ORAL
  Filled 2015-06-03 (×7): qty 1

## 2015-06-03 MED ORDER — BOOST PLUS PO LIQD
237.0000 mL | Freq: Three times a day (TID) | ORAL | Status: DC
  Administered 2015-06-04 – 2015-06-07 (×6): 237 mL via ORAL
  Filled 2015-06-03 (×19): qty 237

## 2015-06-03 MED ORDER — SODIUM CHLORIDE 0.9 % IV SOLN
INTRAVENOUS | Status: DC
  Administered 2015-06-03 – 2015-06-04 (×2): via INTRAVENOUS

## 2015-06-03 NOTE — Progress Notes (Signed)
OT Cancellation Note  Patient Details Name: Shannon SkeetersJohnetta M Rice MRN: [id number] DOB: [birth year]   Cancelled Treatment:    Reason Eval/Treat Not Completed: Patient not medically ready - pt scheduled  for transmetatarsal amputation tomorrow. Will defer OT eval until after post op.    Angelene GiovanniConarpe, Aliyah Abeyta M  Kahlin Mark [place], OTR/L [phone]  06/03/2015, 1:07 PM

## 2015-06-03 NOTE — Consult Note (Signed)
WOC wound consult note Reason for Consult: left buttock/sacrum, foot (ortho to consult) Wound type: Stage 2 pressure injury Pressure Ulcer POA: Yes Measurement: sacrum/left 1.0cm x 1.0cm x 0.2cm  Wound bed: pink, with epithelial buds throughout the wound bed. Drainage (amount, consistency, odor) none Periwound:intact  Dressing procedure/placement/frequency: Continue soft silicone foam to protect and insulate the area. Change every 3 days and PRN soilage.  Dr. Lajoyce Cornersuda to consult for right foot wounds.   Discussed POC with patient and bedside nurse.  Re consult if needed, will not follow at this time. Thanks  Kaleem Sartwell [employer] RN, CWOCN [phone])

## 2015-06-03 NOTE — Progress Notes (Signed)
Initial Nutrition Assessment  DOCUMENTATION CODES:   Severe malnutrition in context of chronic illness, Underweight  INTERVENTION:   -Downgrade diet to dysphagia 3 consistency -Boost Plus TID -MVI daily  NUTRITION DIAGNOSIS:   Malnutrition related to chronic illness as evidenced by severe depletion of body fat, severe depletion of muscle mass.  GOAL:   Patient will meet greater than or equal to 90% of their needs  MONITOR:   PO intake, Supplement acceptance, Labs, Weight trends, Skin, I & O's  REASON FOR ASSESSMENT:   Malnutrition Screening Tool, Consult Wound healing  ASSESSMENT:   81 y.o. female, with lupus, scleroderma, diastolic heart failure and severe peripheral artery disease status post left AKA and has had multiple peripheral vascular catheterization catheterizations for critical limb ischemia (05/17/2015). She presents from [facility] skilled nursing for increasing redness and pain in the leg above her right lower extremity gangrenous toes. The patient is mildly, but pleasantly confused and is not a good historian. Her daughter explains that [facility] due to redness. Found to have Acute right lower extremity cellulitis in the setting of known gangrene in the distal digits.  Pt admitted with rt foot cellulitis.   Hx obtained by pt and family members at bedside. Both confirm that appetite and weight fluctuate. Pt reports that her weight has gone "up and down" since previous hospitalization about one month ago, prior to discharge to SNF. Appetite is also variable; family member reports some days she eats well, but other times eats very little. She notes that pt is a fairly choosy eater, but will eat if she likes what is served. She was consuming supplements PTA; pt prefers Boost over Ensure. RD to order.   Pt complains of difficulty chewing meats, due to being unable to access her dentures. She is agreeable to diet downgrade for ease of  intake.   Reviewed [facility] note from 06/03/15; pt with stage II on sacrum. Also with necrotic wound on rt foot. Per orthopedic note, plan is for transtibial amputation tomorrow (06/04/15).   Nutrition-Focused physical exam completed. Findings are severe fat depletion, severe muscle depletion, and no edema.   Discussed importance of good meal and supplement intake to promote healing.   Labs reviewed.   Diet Order:  Diet Heart Room service appropriate?: Yes; Fluid consistency:: Thin Diet NPO time specified  Skin:  Wound (see comment) (stage II sacrum, necrotic wounds on rt foot)  Last BM:  PTA  Height:   Ht Readings from Last 1 Encounters:  06/02/15 5\' 3"  (1.6 m)    Weight:   Wt Readings from Last 1 Encounters:  06/03/15 87 lb 11.9 oz (39.8 kg)    Ideal Body Weight:  48.8 kg  BMI:  Body mass index is 15.55 kg/(m^2).  Estimated Nutritional Needs:   Kcal:  1300-1500  Protein:  60-75 grams  Fluid:  1.3-1.5 L  EDUCATION NEEDS:   Education needs addressed  Stevee Valenta A. Mayford KnifeWilliams, RD, LDN, CDE Pager: [phone] After hours Pager: [phone]

## 2015-06-03 NOTE — Progress Notes (Signed)
[facility] PROGRESS NOTE  Shannon Rice ZOX:[id number] DOB: [birth year] DOA: 06/02/2015 PCP: Ralene Ok, MD  Length of stay: 1   Assessment/Plan: Principal Problem:   Cellulitis of right foot Active Problems:   Scleroderma (HCC)   Peripheral artery disease (HCC)   Critical lower limb ischemia   HTN (hypertension)   Protein-calorie malnutrition, severe (HCC)   HLD (hyperlipidemia)   Chronic diastolic (congestive) heart failure (HCC)   Gangrene of foot (HCC)   Pressure ulcer   Brief summary 81 y.o. female, with lupus, scleroderma, diastolic heart failure and severe peripheral artery disease status post left AKA and has had multiple peripheral vascular catheterization catheterizations for critical limb ischemia (05/17/2015). She presents from [facility] skilled nursing for increasing redness and pain in the leg above her right lower extremity gangrenous toes. The patient is mildly, but pleasantly confused and is not a good historian. Her daughter explains that she was called [facility] this morning due to redness. Found to have Acute right lower extremity cellulitis in the setting of known gangrene in the distal digits.  Assessment and plan  Acute right lower extremity cellulitis in the setting of known gangrene in the distal digits Unable to obtain pulse in the ER this morning. ABI pending Continue IV vancomycin. Awaiting further recommendations from Dr. Lajoyce Corners. Wound care consultation X-rays negative for osteomyelitis According to Dr. Audrie Lia note from 11/9-patient would need transmetatarsal amputation due to the ischemic changes of 3 out of 5 toes. Discussed with Dr. Nadara Eaton who recommends IV antibiotics for a few days and discharge. He anticipates the toes will auto amputate.  Dr. Lajoyce Corners to make further recommendations Patient is status post Successful atherectomy and drug coated balloon angioplasty of the right external iliac and right common femoral and  right superficial femoral artery  Severe peripheral artery disease. On aspirin, Plavix. Will continue these.  Status post Successful atherectomy and drug coated balloon angioplasty of the right external iliac and right common femoral and right superficial femoral artery on 11/8  Chronic Diastolic Heart Failure Patient appears euvolemic. Will monitor closely for volume overload.   Chronic pain Continue MS Contin, oxycodone, Lyrica.   Dyslipidemia Continue Livalo.  Gastroesophageal reflux disease-Protonix  Scleroderma-continue Lyrica    DVT prophylaxsis Lovenox  Code Status:      Code Status Orders        Start     Ordered   06/02/15 0940  Do not attempt resuscitation (DNR)   Continuous    Question Answer Comment  In the event of cardiac or respiratory ARREST Do not call a "code blue"   In the event of cardiac or respiratory ARREST Do not perform Intubation, CPR, defibrillation or ACLS   In the event of cardiac or respiratory ARREST Use medication by any route, position, wound care, and other measures to relive pain and suffering. May use oxygen, suction and manual treatment of airway obstruction as needed for comfort.      06/02/15 8119    Advance Directive Documentation        Most Recent Value   Type of Advance Directive  Out of facility DNR (pink MOST or yellow form)   Pre-existing out of facility DNR order (yellow form or pink MOST form)  Yellow form placed in chart (order not valid for inpatient use)   "MOST" Form in Place?       Family Communication: family updated about patient's clinical progress Disposition Plan:  Plans per orthopedic surgery recommendations  Consultants:  Dr. Lajoyce Corners  Procedures:  None  Antibiotics: Anti-infectives    Start     Dose/Rate Route Frequency Ordered Stop   06/03/15 0600  vancomycin (VANCOCIN) 500 mg in sodium chloride 0.9 % 100 mL IVPB     500 mg 100 mL/hr over 60 Minutes Intravenous Every 24 hours 06/02/15  1908     06/02/15 0945  vancomycin (VANCOCIN) IVPB 1000 mg/200 mL premix  Status:  Discontinued     1,000 mg 200 mL/hr over 60 Minutes Intravenous  Once 06/02/15 0939 06/02/15 0956   06/02/15 0615  vancomycin (VANCOCIN) 500 mg in sodium chloride 0.9 % 100 mL IVPB     500 mg 100 mL/hr over 60 Minutes Intravenous  Once 06/02/15 0614 06/02/15 0808         HPI/Subjective: Confused ,trying to get out of bed   Objective: Filed Vitals:   06/02/15 1409 06/02/15 2129 06/03/15 0555 06/03/15 0700  BP: 160/56 149/51 128/40   Pulse: 84 80 79   Temp:  99.1 F (37.3 C) 99.2 F (37.3 C)   TempSrc:  Oral Oral   Resp: 22 18 16    Height:      Weight:    39.8 kg (87 lb 11.9 oz)  SpO2: 100% 100% 98%     Intake/Output Summary (Last 24 hours) at 06/03/15 0859 Last data filed at 06/02/15 2300  Gross per 24 hour  Intake    300 ml  Output    200 ml  Net    100 ml    Exam:  General: No acute respiratory distress Lungs: Clear to auscultation bilaterally without wheezes or crackles Cardiovascular: Regular rate and rhythm without murmur gallop or rub normal S1 and S2 Abdomen: Nontender, nondistended, soft, bowel sounds positive, no rebound, no ascites, no appreciable mass Extremities: Able to move all 4. 5/5 strength in each, no effusions. Left AKA. Right lower extremity has mild erythema in the tibial area, toes are black with gangrene. Dried white pus is surrounding her toes   Data Review   Micro Results Recent Results (from the past 240 hour(s))  MRSA PCR Screening     Status: None   Collection Time: 05/25/15  4:33 AM  Result Value Ref Range Status   MRSA by PCR NEGATIVE NEGATIVE Final    Comment:        The GeneXpert MRSA Assay (FDA approved for NASAL specimens only), is one component of a comprehensive MRSA colonization surveillance program. It is not intended to diagnose MRSA infection nor to guide or monitor treatment for MRSA infections.   MRSA PCR Screening     Status:  None   Collection Time: 06/02/15 12:00 PM  Result Value Ref Range Status   MRSA by PCR NEGATIVE NEGATIVE Final    Comment:        The GeneXpert MRSA Assay (FDA approved for NASAL specimens only), is one component of a comprehensive MRSA colonization surveillance program. It is not intended to diagnose MRSA infection nor to guide or monitor treatment for MRSA infections.     Radiology Reports Ct Abdomen Pelvis Wo Contrast  05/25/2015  CLINICAL DATA:  Acute onset of dizziness. Anemia. Assess for retroperitoneal bleed. Initial encounter. EXAM: CT ABDOMEN AND PELVIS WITHOUT CONTRAST TECHNIQUE: Multidetector CT imaging of the abdomen and pelvis was performed following the standard protocol without IV contrast. COMPARISON:  CT of the abdomen and pelvis performed 08/24/2010 FINDINGS: There is no evidence of retroperitoneal hemorrhage. Mild diffuse soft tissue edema is noted  about the abdomen and pelvis, likely reflecting mild anasarca. Trace bilateral pleural fluid is noted, with bibasilar atelectasis. Mild biatrial enlargement is noted. Diffuse coronary artery calcifications are seen. The liver and spleen are unremarkable in appearance. The gallbladder is within normal limits. The pancreas and adrenal glands are unremarkable. A few small hyperdense foci within the kidneys may reflect small hyperdense cysts. There is no evidence of hydronephrosis. A 2 mm stone is noted at the upper pole of the right kidney. No obstructing ureteral stones are seen. No perinephric stranding is appreciated. No free fluid is identified. The small bowel is unremarkable in appearance. The stomach is within normal limits. No acute vascular abnormalities are seen. Diffuse calcification is noted along the abdominal aorta and its branches. The appendix is normal in caliber, without evidence of appendicitis. The colon is unremarkable in appearance. The bladder is significantly distended and grossly unremarkable. The patient is  status post hysterectomy. No suspicious adnexal masses are seen. No inguinal lymphadenopathy is seen. No acute osseous abnormalities are identified. A chronic right-sided pars defect is noted at L5. IMPRESSION: 1. No evidence of acute hemorrhage. 2. Mild diffuse soft tissue edema about the abdomen and pelvis, likely reflecting mild anasarca. 3. Trace bilateral pleural fluid, with bibasilar atelectasis. 4. Mild biatrial enlargement noted. 5. Diffuse coronary artery calcifications seen. 6. Diffuse calcification along the abdominal aorta and branches. 7. Few small hyperdense foci within the kidneys may reflect small hyperdense cysts. 2 mm nonobstructing stone at the upper pole of the right kidney. 8. Chronic right-sided pars defect at L5. Electronically Signed   By: Roanna Raider M.D.   On: 05/25/2015 02:55   Dg Foot Complete Right  06/02/2015  CLINICAL DATA:  Gangrene of the foot. EXAM: RIGHT FOOT COMPLETE - 3+ VIEW COMPARISON:  None. FINDINGS: There is no fracture or dislocation. No bone destruction to suggest osteomyelitis. Soft tissue swelling of the dorsum of the foot. Arterial vascular calcifications. IMPRESSION: No acute osseous abnormality.  Osteopenia. Electronically Signed   By: Francene Boyers M.D.   On: 06/02/2015 08:55     CBC  Recent Labs Lab 06/02/15 0620 06/03/15 0406  WBC 4.8 4.5  HGB 10.0* 9.4*  HCT 33.1* 31.4*  PLT 152 167  MCV 95.9 96.3  MCH 29.0 28.8  MCHC 30.2 29.9*  RDW 14.7 14.8  LYMPHSABS 0.9  --   MONOABS 0.3  --   EOSABS 0.1  --   BASOSABS 0.0  --     Chemistries   Recent Labs Lab 06/02/15 0620 06/03/15 0406  NA 136 138  K 4.1 3.7  CL 100* 103  CO2 28 29  GLUCOSE 87 123*  BUN 6 10  CREATININE 0.43* 0.48  CALCIUM 8.8* 8.5*  AST 23  --   ALT 13*  --   ALKPHOS 57  --   BILITOT 0.6  --    ------------------------------------------------------------------------------------------------------------------ estimated creatinine clearance is 34.1 mL/min (by  C-G formula based on Cr of 0.48). ------------------------------------------------------------------------------------------------------------------ No results for input(s): HGBA1C in the last 72 hours. ------------------------------------------------------------------------------------------------------------------ No results for input(s): CHOL, HDL, LDLCALC, TRIG, CHOLHDL, LDLDIRECT in the last 72 hours. ------------------------------------------------------------------------------------------------------------------ No results for input(s): TSH, T4TOTAL, T3FREE, THYROIDAB in the last 72 hours.  Invalid input(s): FREET3 ------------------------------------------------------------------------------------------------------------------ No results for input(s): VITAMINB12, FOLATE, FERRITIN, TIBC, IRON, RETICCTPCT in the last 72 hours.  Coagulation profile No results for input(s): INR, PROTIME in the last 168 hours.  No results for input(s): DDIMER in the last 72 hours.  Cardiac Enzymes No  results for input(s): CKMB, TROPONINI, MYOGLOBIN in the last 168 hours.  Invalid input(s): CK ------------------------------------------------------------------------------------------------------------------ Invalid input(s): POCBNP   CBG: No results for input(s): GLUCAP in the last 168 hours.     Studies: Dg Foot Complete Right  06/02/2015  CLINICAL DATA:  Gangrene of the foot. EXAM: RIGHT FOOT COMPLETE - 3+ VIEW COMPARISON:  None. FINDINGS: There is no fracture or dislocation. No bone destruction to suggest osteomyelitis. Soft tissue swelling of the dorsum of the foot. Arterial vascular calcifications. IMPRESSION: No acute osseous abnormality.  Osteopenia. Electronically Signed   By: Francene BoyersJames  Maxwell M.D.   On: 06/02/2015 08:55      No results found for: HGBA1C Lab Results  Component Value Date   LDLCALC 43 05/25/2015   CREATININE 0.48 06/03/2015       Scheduled Meds: . amLODipine  5  mg Oral Daily  . aspirin EC  81 mg Oral Daily  . benazepril  20 mg Oral Daily  . clopidogrel  75 mg Oral Daily  . docusate sodium  100 mg Oral BID  . enoxaparin (LOVENOX) injection  30 mg Subcutaneous Q24H  . feeding supplement (ENSURE ENLIVE)  237 mL Oral BID BM  . morphine  15 mg Oral Q12H  . pantoprazole  40 mg Oral Daily  . pravastatin  80 mg Oral q1800  . pregabalin  50 mg Oral TID  . vancomycin  500 mg Intravenous Q24H   Continuous Infusions:   Principal Problem:   Cellulitis of right foot Active Problems:   Scleroderma (HCC)   Peripheral artery disease (HCC)   Critical lower limb ischemia   HTN (hypertension)   Protein-calorie malnutrition, severe (HCC)   HLD (hyperlipidemia)   Chronic diastolic (congestive) heart failure (HCC)   Gangrene of foot (HCC)   Pressure ulcer    Time spent: 45 minutes   [facility] HarlingenBROL,Seung Nidiffer  [facility] Pager [phone]. If 7PM-7AM, please contact night-coverage at [email or web address], password [email or web address] 06/03/2015, 8:59 AM  LOS: 1 day

## 2015-06-03 NOTE — Consult Note (Signed)
Reason for Consult: Progressive gangrenous changes right foot Referring Physician: Dr Alycia Patten RHEDA Rice is an 81 y.o. female.  HPI: Patient is an 81 year old woman with severe peripheral vascular disease. She is status post transtibial amputation on the left and presents at this time with progressive dry gangrenous changes of her entire right forefoot.  Past Medical History  Diagnosis Date  . Hypertension   . GERD (gastroesophageal reflux disease)   . Lupus (HCC)     skin, scleraderma  . Scleroderma ([place])   . Arthritis     scleroderma  . Peripheral vascular disease ([place])   . PAD (peripheral artery disease) ([place])   . Heart murmur   . Chronic diastolic (congestive) heart failure [facility])     Past Surgical History  Procedure Laterality Date  . Abdominal hysterectomy    . Coronary angioplasty      Dr Tami Ribas  . Sympathectomy Right 01/27/2015    Procedure: RIGHT RADICAL ARTERY SYMPATHECTOMY, RIGHT ULNAR ARTERY SYMPATHECTOMY, DIGITAL SYMPATHECTOMY RIGHT INDEX, MIDDLE, RING SMALL;  Surgeon: Daryll Brod, MD;  Location: [place];  Service: Orthopedics;  Laterality: Right;  . Amputation Right 01/27/2015    Procedure: AMPUTATION DISTAL TIP RIGHT INDEX FINGER;  Surgeon: Daryll Brod, MD;  Location: [place];  Service: Orthopedics;  Laterality: Right;  . Peripheral vascular catheterization N/A 02/15/2015    Procedure: Lower Extremity Angiography;  Surgeon: Adrian Prows, MD;  Location: [place] [facility];  Service: Cardiovascular;  Laterality: N/A;  . Peripheral vascular catheterization Left 04/12/2015    Procedure: Lower Extremity Angiography;  Surgeon: Adrian Prows, MD;  Location: [place] [facility];  Service: Cardiovascular;  Laterality: Left;  . Peripheral vascular catheterization Left 04/12/2015    Procedure: Peripheral Vascular Atherectomy;  Surgeon: Adrian Prows, MD;  Location: [place] [facility];  Service: Cardiovascular;  Laterality: Left;  sfa     . Amputation  Left 04/20/2015    Procedure: Left Below Knee Amputation;  Surgeon: Newt Minion, MD;  Location: [place];  Service: Orthopedics;  Laterality: Left;  . Peripheral arterial stent graft Left 05/17/2015  . Peripheral vascular catheterization N/A 05/17/2015    Procedure: Lower Extremity Intervention;  Surgeon: Adrian Prows, MD;  Location: [place] [facility];  Service: Cardiovascular;  Laterality: N/A;    Family History  Problem Relation Age of Onset  . Diabetes Sister   . Diabetes Brother     Social History:  reports that she quit smoking about 5 months ago. Her smoking use included Cigarettes. She has a 15 pack-year smoking history. She has never used smokeless tobacco. She reports that she drinks alcohol. She reports that she does not use illicit drugs.  Allergies: No Known Allergies  Medications: I have reviewed the patient's current medications.  Results for orders placed or performed during the hospital encounter of 06/02/15 (from the past 48 hour(s))  Basic metabolic panel     Status: Abnormal   Collection Time: 06/02/15  6:20 AM  Result Value Ref Range   Sodium 136 135 - 145 mmol/L   Potassium 4.1 3.5 - 5.1 mmol/L   Chloride 100 (L) 101 - 111 mmol/L   CO2 28 22 - 32 mmol/L   Glucose, Bld 87 65 - 99 mg/dL   BUN 6 6 - 20 mg/dL   Creatinine, Ser 0.43 (L) 0.44 - 1.00 mg/dL   Calcium 8.8 (L) 8.9 - 10.3 mg/dL   GFR calc non Af Amer >60 >60 mL/min   GFR  calc Af Amer >60 >60 mL/min    Comment: (NOTE) The eGFR has been calculated using the CKD EPI equation. This calculation has not been validated in all clinical situations. eGFR's persistently <60 mL/min signify possible Chronic Kidney Disease.    Anion gap 8 5 - 15  CBC with Differential     Status: Abnormal   Collection Time: 06/02/15  6:20 AM  Result Value Ref Range   WBC 4.8 4.0 - 10.5 K/uL   RBC 3.45 (L) 3.87 - 5.11 MIL/uL   Hemoglobin 10.0 (L) 12.0 - 15.0 g/dL   HCT 33.1 (L) 36.0 - 46.0 %   MCV 95.9 78.0 - 100.0 fL   MCH 29.0  26.0 - 34.0 pg   MCHC 30.2 30.0 - 36.0 g/dL   RDW 14.7 11.5 - 15.5 %   Platelets 152 150 - 400 K/uL   Neutrophils Relative % 74 %   Neutro Abs 3.5 1.7 - 7.7 K/uL   Lymphocytes Relative 18 %   Lymphs Abs 0.9 0.7 - 4.0 K/uL   Monocytes Relative 7 %   Monocytes Absolute 0.3 0.1 - 1.0 K/uL   Eosinophils Relative 1 %   Eosinophils Absolute 0.1 0.0 - 0.7 K/uL   Basophils Relative 0 %   Basophils Absolute 0.0 0.0 - 0.1 K/uL  Hepatic function panel     Status: Abnormal   Collection Time: 06/02/15  6:20 AM  Result Value Ref Range   Total Protein 6.7 6.5 - 8.1 g/dL   Albumin 2.8 (L) 3.5 - 5.0 g/dL   AST 23 15 - 41 U/L   ALT 13 (L) 14 - 54 U/L   Alkaline Phosphatase 57 38 - 126 U/L   Total Bilirubin 0.6 0.3 - 1.2 mg/dL   Bilirubin, Direct 0.2 0.1 - 0.5 mg/dL   Indirect Bilirubin 0.4 0.3 - 0.9 mg/dL  Urinalysis, Routine w reflex microscopic (not at [facility])     Status: None   Collection Time: 06/02/15  8:40 AM  Result Value Ref Range   Color, Urine YELLOW YELLOW   APPearance CLEAR CLEAR   Specific Gravity, Urine 1.010 1.005 - 1.030   pH 7.5 5.0 - 8.0   Glucose, UA NEGATIVE NEGATIVE mg/dL   Hgb urine dipstick NEGATIVE NEGATIVE   Bilirubin Urine NEGATIVE NEGATIVE   Ketones, ur NEGATIVE NEGATIVE mg/dL   Protein, ur NEGATIVE NEGATIVE mg/dL   Nitrite NEGATIVE NEGATIVE   Leukocytes, UA NEGATIVE NEGATIVE    Comment: MICROSCOPIC NOT DONE ON URINES WITH NEGATIVE PROTEIN, BLOOD, LEUKOCYTES, NITRITE, OR GLUCOSE <1000 mg/dL.  MRSA PCR Screening     Status: None   Collection Time: 06/02/15 12:00 PM  Result Value Ref Range   MRSA by PCR NEGATIVE NEGATIVE    Comment:        The GeneXpert MRSA Assay (FDA approved for NASAL specimens only), is one component of a comprehensive MRSA colonization surveillance program. It is not intended to diagnose MRSA infection nor to guide or monitor treatment for MRSA infections.   CBC     Status: Abnormal   Collection Time: 06/03/15  4:06 AM  Result Value  Ref Range   WBC 4.5 4.0 - 10.5 K/uL   RBC 3.26 (L) 3.87 - 5.11 MIL/uL   Hemoglobin 9.4 (L) 12.0 - 15.0 g/dL   HCT 31.4 (L) 36.0 - 46.0 %   MCV 96.3 78.0 - 100.0 fL   MCH 28.8 26.0 - 34.0 pg   MCHC 29.9 (L) 30.0 - 36.0 g/dL   RDW  14.8 11.5 - 15.5 %   Platelets 167 150 - 400 K/uL  Basic metabolic panel     Status: Abnormal   Collection Time: 06/03/15  4:06 AM  Result Value Ref Range   Sodium 138 135 - 145 mmol/L   Potassium 3.7 3.5 - 5.1 mmol/L   Chloride 103 101 - 111 mmol/L   CO2 29 22 - 32 mmol/L   Glucose, Bld 123 (H) 65 - 99 mg/dL   BUN 10 6 - 20 mg/dL   Creatinine, Ser 0.48 0.44 - 1.00 mg/dL   Calcium 8.5 (L) 8.9 - 10.3 mg/dL   GFR calc non Af Amer >60 >60 mL/min   GFR calc Af Amer >60 >60 mL/min    Comment: (NOTE) The eGFR has been calculated using the CKD EPI equation. This calculation has not been validated in all clinical situations. eGFR's persistently <60 mL/min signify possible Chronic Kidney Disease.    Anion gap 6 5 - 15    Dg Foot Complete Right  06/02/2015  CLINICAL DATA:  Gangrene of the foot. EXAM: RIGHT FOOT COMPLETE - 3+ VIEW COMPARISON:  None. FINDINGS: There is no fracture or dislocation. No bone destruction to suggest osteomyelitis. Soft tissue swelling of the dorsum of the foot. Arterial vascular calcifications. IMPRESSION: No acute osseous abnormality.  Osteopenia. Electronically Signed   By: Lorriane Shire M.D.   On: 06/02/2015 08:55    Review of Systems  All other systems reviewed and are negative.  Blood pressure 128/40, pulse 79, temperature 99.2 F (37.3 C), temperature source Oral, resp. rate 16, height _0  (1.6 m), weight 39.8 kg (87 lb 11.9 oz), SpO2 98 %. Physical Exam On examination patient has some dementia. She is afebrile no acute systemic illness. No elevated white cell count. Her hemoglobin is stable at 9.4. Examination of her right foot she has thin atrophic skin with dry gangrenous changes of all of her toes. There is no ascending  cellulitis no evidence of an abscess no odor. Assessment/Plan: Assessment: Dry gangrene right forefoot status post left transtibial amputation with severe peripheral vascular disease.  Plan: We'll plan for a right transtibial amputation tomorrow morning Saturday I discussed with the patient's daughter over the phone the risks and benefits of surgery patient's daughter states she understands and agrees to proceed with surgery. Patient will need discharge to skilled nursing postoperatively.  DUDA,MARCUS V 06/03/2015, 10:53 AM

## 2015-06-03 NOTE — Progress Notes (Signed)
PT Cancellation Note  Patient Details Name: Shannon Rice MRN: [id number] DOB: [birth year]   Cancelled Treatment:    Reason Eval/Treat Not Completed: Medical issues which prohibited therapy.  Per Orthopedics note, patient for transtibial amputation tomorrow.  Will defer PT evaluation today, and will need a re-order for PT post-op.  Will await post-op re-order to initiate PT evaluation.  Thank you.   Vena AustriaDavis, Angas Isabell H 06/03/2015, 12:37 PM Durenda HurtSusan H. [name], PT, [facility] Pager [phone]

## 2015-06-04 ENCOUNTER — Encounter (HOSPITAL_COMMUNITY)
Admission: EM | Disposition: A | Discharge status: Skilled Nursing Facility | Payer: Self-pay | Source: Home / Self Care | Attending: Internal Medicine

## 2015-06-04 ENCOUNTER — Inpatient Hospital Stay (HOSPITAL_COMMUNITY): Payer: Medicare Other | Admitting: Certified Registered Nurse Anesthetist

## 2015-06-04 ENCOUNTER — Inpatient Hospital Stay (HOSPITAL_COMMUNITY): Payer: Medicare Other

## 2015-06-04 HISTORY — PX: AMPUTATION: SHX166

## 2015-06-04 LAB — COMPREHENSIVE METABOLIC PANEL
ALBUMIN: 2.9 g/dL — AB (ref 3.5–5.0)
ALK PHOS: 66 U/L (ref 38–126)
ALT: 12 U/L — ABNORMAL LOW (ref 14–54)
ANION GAP: 8 (ref 5–15)
AST: 23 U/L (ref 15–41)
BILIRUBIN TOTAL: 0.5 mg/dL (ref 0.3–1.2)
BUN: 7 mg/dL (ref 6–20)
CALCIUM: 8.8 mg/dL — AB (ref 8.9–10.3)
CO2: 26 mmol/L (ref 22–32)
CREATININE: 0.46 mg/dL (ref 0.44–1.00)
Chloride: 101 mmol/L (ref 101–111)
GFR calc Af Amer: >60 mL/min (ref >60)
GFR calc non Af Amer: >60 mL/min (ref >60)
Glucose, Bld: 104 mg/dL — ABNORMAL HIGH (ref 65–99)
Potassium: 3.9 mmol/L (ref 3.5–5.1)
SODIUM: 135 mmol/L (ref 135–145)
TOTAL PROTEIN: 7.6 g/dL (ref 6.5–8.1)

## 2015-06-04 LAB — CBC
HEMATOCRIT: 37.8 % (ref 36.0–46.0)
HEMOGLOBIN: 11.6 g/dL — AB (ref 12.0–15.0)
MCH: 29.7 pg (ref 26.0–34.0)
MCHC: 30.7 g/dL (ref 30.0–36.0)
MCV: 96.9 fL (ref 78.0–100.0)
Platelets: 182 10*3/uL (ref 150–400)
RBC: 3.9 MIL/uL (ref 3.87–5.11)
RDW: 14.6 % (ref 11.5–15.5)
WBC: 7.8 10*3/uL (ref 4.0–10.5)

## 2015-06-04 LAB — POCT I-STAT 3, ART BLOOD GAS (G3+)
ACID-BASE EXCESS: 3 mmol/L — AB (ref 0.0–2.0)
Bicarbonate: 27.5 mEq/L — ABNORMAL HIGH (ref 20.0–24.0)
O2 Saturation: 86 %
PCO2 ART: 42.7 mmHg (ref 35.0–45.0)
PH ART: 7.417 (ref 7.350–7.450)
Patient temperature: 98.6
TCO2: 29 mmol/L (ref 0–100)
pO2, Arterial: 50 mmHg — ABNORMAL LOW (ref 80.0–100.0)

## 2015-06-04 SURGERY — AMPUTATION BELOW KNEE
Anesthesia: General | Site: Leg Lower | Laterality: Right

## 2015-06-04 MED ORDER — ACETAMINOPHEN 650 MG RE SUPP: 650.0000 mg | Freq: Four times a day (QID) | RECTAL | Status: DC | PRN

## 2015-06-04 MED ORDER — SUCCINYLCHOLINE CHLORIDE 20 MG/ML IJ SOLN
INTRAMUSCULAR | Status: AC
  Filled 2015-06-04: qty 1

## 2015-06-04 MED ORDER — PROMETHAZINE HCL 25 MG/ML IJ SOLN: 6.2500 mg | INTRAMUSCULAR | Status: DC | PRN

## 2015-06-04 MED ORDER — 0.9 % SODIUM CHLORIDE (POUR BTL) OPTIME
TOPICAL | Status: DC | PRN
  Administered 2015-06-04: 1000 mL

## 2015-06-04 MED ORDER — MEPERIDINE HCL 25 MG/ML IJ SOLN: 6.2500 mg | INTRAMUSCULAR | Status: DC | PRN

## 2015-06-04 MED ORDER — MIDAZOLAM HCL 2 MG/2ML IJ SOLN: 0.5000 mg | Freq: Once | INTRAMUSCULAR | Status: DC | PRN

## 2015-06-04 MED ORDER — FENTANYL CITRATE (PF) 250 MCG/5ML IJ SOLN
INTRAMUSCULAR | Status: DC | PRN
  Administered 2015-06-04: 25 ug via INTRAVENOUS
  Administered 2015-06-04: 50 ug via INTRAVENOUS

## 2015-06-04 MED ORDER — SUCCINYLCHOLINE CHLORIDE 20 MG/ML IJ SOLN
INTRAMUSCULAR | Status: DC | PRN
  Administered 2015-06-04: 100 mg via INTRAVENOUS

## 2015-06-04 MED ORDER — HYDROMORPHONE HCL 1 MG/ML IJ SOLN
0.2500 mg | INTRAMUSCULAR | Status: DC | PRN
  Administered 2015-06-04 (×2): 0.25 mg via INTRAVENOUS
  Administered 2015-06-04: 0.5 mg via INTRAVENOUS

## 2015-06-04 MED ORDER — METHOCARBAMOL 1000 MG/10ML IJ SOLN
500.0000 mg | Freq: Four times a day (QID) | INTRAVENOUS | Status: DC | PRN
  Filled 2015-06-04: qty 5

## 2015-06-04 MED ORDER — DEXAMETHASONE SODIUM PHOSPHATE 4 MG/ML IJ SOLN
INTRAMUSCULAR | Status: DC | PRN
  Administered 2015-06-04: 4 mg via INTRAVENOUS

## 2015-06-04 MED ORDER — ACETAMINOPHEN 325 MG PO TABS: 650.0000 mg | ORAL_TABLET | Freq: Four times a day (QID) | ORAL | Status: DC | PRN

## 2015-06-04 MED ORDER — PROPOFOL 10 MG/ML IV BOLUS
INTRAVENOUS | Status: DC | PRN
  Administered 2015-06-04: 120 mg via INTRAVENOUS

## 2015-06-04 MED ORDER — LEVALBUTEROL HCL 1.25 MG/0.5ML IN NEBU: 1.2500 mg | INHALATION_SOLUTION | Freq: Four times a day (QID) | RESPIRATORY_TRACT | Status: DC | PRN

## 2015-06-04 MED ORDER — ONDANSETRON HCL 4 MG PO TABS
4.0000 mg | ORAL_TABLET | Freq: Four times a day (QID) | ORAL | Status: DC | PRN
  Administered 2015-06-09: 4 mg via ORAL
  Filled 2015-06-04: qty 1

## 2015-06-04 MED ORDER — METOCLOPRAMIDE HCL 5 MG/ML IJ SOLN: 5.0000 mg | Freq: Three times a day (TID) | INTRAMUSCULAR | Status: DC | PRN

## 2015-06-04 MED ORDER — ONDANSETRON HCL 4 MG/2ML IJ SOLN: 4.0000 mg | Freq: Four times a day (QID) | INTRAMUSCULAR | Status: DC | PRN

## 2015-06-04 MED ORDER — SODIUM CHLORIDE 0.9 % IV SOLN: INTRAVENOUS | Status: DC

## 2015-06-04 MED ORDER — METOCLOPRAMIDE HCL 10 MG PO TABS: 5.0000 mg | ORAL_TABLET | Freq: Three times a day (TID) | ORAL | Status: DC | PRN

## 2015-06-04 MED ORDER — HYDROMORPHONE HCL 1 MG/ML IJ SOLN
0.5000 mg | Freq: Once | INTRAMUSCULAR | Status: AC
  Administered 2015-06-04: 0.5 mg via INTRAVENOUS
  Filled 2015-06-04: qty 1

## 2015-06-04 MED ORDER — METHOCARBAMOL 500 MG PO TABS
500.0000 mg | ORAL_TABLET | Freq: Four times a day (QID) | ORAL | Status: DC | PRN
  Administered 2015-06-10: 500 mg via ORAL
  Filled 2015-06-04: qty 1

## 2015-06-04 MED ORDER — PROPOFOL 10 MG/ML IV BOLUS
INTRAVENOUS | Status: AC
  Filled 2015-06-04: qty 20

## 2015-06-04 MED ORDER — LACTATED RINGERS IV SOLN
INTRAVENOUS | Status: DC
  Administered 2015-06-04: 07:00:00 via INTRAVENOUS

## 2015-06-04 MED ORDER — LIDOCAINE HCL (CARDIAC) 20 MG/ML IV SOLN
INTRAVENOUS | Status: AC
  Filled 2015-06-04: qty 5

## 2015-06-04 MED ORDER — LACTATED RINGERS IV SOLN
INTRAVENOUS | Status: DC | PRN
  Administered 2015-06-04: 07:00:00 via INTRAVENOUS

## 2015-06-04 MED ORDER — FUROSEMIDE 10 MG/ML IJ SOLN
40.0000 mg | Freq: Once | INTRAMUSCULAR | Status: AC
  Administered 2015-06-04: 40 mg via INTRAVENOUS
  Filled 2015-06-04: qty 4

## 2015-06-04 MED ORDER — FENTANYL CITRATE (PF) 250 MCG/5ML IJ SOLN
INTRAMUSCULAR | Status: AC
  Filled 2015-06-04: qty 5

## 2015-06-04 MED ORDER — CETYLPYRIDINIUM CHLORIDE 0.05 % MT LIQD
7.0000 mL | Freq: Two times a day (BID) | OROMUCOSAL | Status: DC
  Administered 2015-06-04 – 2015-06-10 (×13): 7 mL via OROMUCOSAL

## 2015-06-04 MED ORDER — HYDROMORPHONE HCL 1 MG/ML IJ SOLN
INTRAMUSCULAR | Status: AC
  Administered 2015-06-04: 0.25 mg via INTRAVENOUS
  Filled 2015-06-04: qty 1

## 2015-06-04 MED ORDER — PIPERACILLIN-TAZOBACTAM 3.375 G IVPB
3.3750 g | Freq: Three times a day (TID) | INTRAVENOUS | Status: DC
  Administered 2015-06-04 – 2015-06-07 (×10): 3.375 g via INTRAVENOUS
  Filled 2015-06-04 (×11): qty 50

## 2015-06-04 MED ORDER — ONDANSETRON HCL 4 MG/2ML IJ SOLN
INTRAMUSCULAR | Status: DC | PRN
  Administered 2015-06-04: 4 mg via INTRAVENOUS

## 2015-06-04 SURGICAL SUPPLY — 36 items
BLADE SAW RECIP 87.9 MT (BLADE) ×3 IMPLANT
BLADE SURG 21 STRL SS (BLADE) ×3 IMPLANT
BNDG COHESIVE 6X5 TAN STRL LF (GAUZE/BANDAGES/DRESSINGS) ×3 IMPLANT
BNDG GAUZE ELAST 4 BULKY (GAUZE/BANDAGES/DRESSINGS) ×3 IMPLANT
COVER SURGICAL LIGHT HANDLE (MISCELLANEOUS) ×6 IMPLANT
CUFF TOURNIQUET SINGLE 34IN LL (TOURNIQUET CUFF) IMPLANT
CUFF TOURNIQUET SINGLE 44IN (TOURNIQUET CUFF) IMPLANT
DRAPE EXTREMITY T 121X128X90 (DRAPE) ×3 IMPLANT
DRAPE PROXIMA HALF (DRAPES) ×6 IMPLANT
DRAPE U-SHAPE 47X51 STRL (DRAPES) ×3 IMPLANT
DRSG ADAPTIC 3X8 NADH LF (GAUZE/BANDAGES/DRESSINGS) ×3 IMPLANT
DRSG PAD ABDOMINAL 8X10 ST (GAUZE/BANDAGES/DRESSINGS) ×3 IMPLANT
DURAPREP 26ML APPLICATOR (WOUND CARE) ×3 IMPLANT
ELECT REM PT RETURN 9FT ADLT (ELECTROSURGICAL) ×3
ELECTRODE REM PT RTRN 9FT ADLT (ELECTROSURGICAL) ×1 IMPLANT
GAUZE SPONGE 4X4 12PLY STRL (GAUZE/BANDAGES/DRESSINGS) ×3 IMPLANT
GLOVE BIOGEL PI IND STRL 9 (GLOVE) ×1 IMPLANT
GLOVE BIOGEL PI INDICATOR 9 (GLOVE) ×2
GLOVE SURG ORTHO 9.0 STRL STRW (GLOVE) ×3 IMPLANT
GOWN STRL REUS W/ TWL XL LVL3 (GOWN DISPOSABLE) ×2 IMPLANT
GOWN STRL REUS W/TWL XL LVL3 (GOWN DISPOSABLE) ×4
KIT BASIN OR (CUSTOM PROCEDURE TRAY) ×3 IMPLANT
KIT ROOM TURNOVER OR (KITS) ×3 IMPLANT
MANIFOLD NEPTUNE II (INSTRUMENTS) ×3 IMPLANT
NS IRRIG 1000ML POUR BTL (IV SOLUTION) ×3 IMPLANT
PACK GENERAL/GYN (CUSTOM PROCEDURE TRAY) ×3 IMPLANT
PAD ARMBOARD 7.5X6 YLW CONV (MISCELLANEOUS) ×6 IMPLANT
SPONGE LAP 18X18 X RAY DECT (DISPOSABLE) IMPLANT
STAPLER VISISTAT 35W (STAPLE) IMPLANT
STOCKINETTE IMPERVIOUS LG (DRAPES) ×3 IMPLANT
SUT SILK 2 0 (SUTURE) ×3
SUT SILK 2-0 18XBRD TIE 12 (SUTURE) ×1 IMPLANT
SUT VIC AB 1 CTX 27 (SUTURE) IMPLANT
TOWEL OR 17X24 6PK STRL BLUE (TOWEL DISPOSABLE) ×3 IMPLANT
TOWEL OR 17X26 10 PK STRL BLUE (TOWEL DISPOSABLE) ×3 IMPLANT
WATER STERILE IRR 1000ML POUR (IV SOLUTION) ×3 IMPLANT

## 2015-06-04 NOTE — Transfer of Care (Signed)
Immediate Anesthesia Transfer of Care Note  Patient: Cyndia SkeetersJohnetta M Meiser  Procedure(s) Performed: Procedure(s): AMPUTATION BELOW KNEE (Right)  Patient Location: PACU  Anesthesia Type:General  Level of Consciousness: sedated  Airway & Oxygen Therapy: Patient Spontanous Breathing and Patient connected to face mask oxygen  Post-op Assessment: Report given to RN and Post -op Vital signs reviewed and stable  Post vital signs: Reviewed and stable  Last Vitals:  Filed Vitals:   06/03/15 2128 06/04/15 0558  BP: 155/47 187/54  Pulse: 86 107  Temp: 37.1 C 37 C  Resp: 20 20    Complications: Intr-op aspiration, see note

## 2015-06-04 NOTE — Op Note (Signed)
   Date of Surgery: 06/04/2015  INDICATIONS: Ms. Shannon Rice is a 81 y.o.-year-old female who has severe peripheral vascular disease status post transtibial amputation the left who presents at this time with gangrenous changes of the entire right forefoot.Marland Kitchen.  PREOPERATIVE DIAGNOSIS: Gangrene right forefoot  POSTOPERATIVE DIAGNOSIS: Same.  PROCEDURE: Transtibial amputation on the right  SURGEON: Lajoyce Cornersuda, M.D.  ANESTHESIA:  general  IV FLUIDS AND URINE: See anesthesia.  ESTIMATED BLOOD LOSS: Minimal mL.  COMPLICATIONS: Aspiration. Bronchoscopy did not show any foreign material within the bronchi.  DESCRIPTION OF PROCEDURE: The patient was brought to the operating room and underwent a general anesthetic. After adequate levels of anesthesia were obtained patient's lower extremity was prepped using DuraPrep draped into a sterile field. A timeout was called.  A transverse incision was made 11 cm distal to the tibial tubercle. This curved proximally and a large posterior flap was created. The tibia was transected 1 cm proximal to the skin incision. The fibula was transected just proximal to the tibial incision. The tibia was beveled anteriorly. A large posterior flap was created. The sciatic nerve was pulled cut and allowed to retract. The vascular bundles were suture ligated with 2-0 silk. The deep and superficial fascial layers were closed using #1 Vicryl. The skin was closed using staples and 2-0 nylon. The wound was covered with Adaptic orthopedic sponges AB dressing Kerlix and Coban. Patient was extubated taken to the PACU in stable condition.  Shannon BakerMarcus Jarryn Altland, MD [facility] 8:35 AM

## 2015-06-04 NOTE — H&P (View-Only) (Signed)
Reason for Consult: Progressive gangrenous changes right foot Referring Physician: Dr Alycia Patten Shannon Rice is an 81 y.o. female.  HPI: Patient is an 81 year old woman with severe peripheral vascular disease. She is status post transtibial amputation on the left and presents at this time with progressive dry gangrenous changes of her entire right forefoot.  Past Medical History  Diagnosis Date  . Hypertension   . GERD (gastroesophageal reflux disease)   . Lupus (HCC)     skin, scleraderma  . Scleroderma ([place])   . Arthritis     scleroderma  . Peripheral vascular disease ([place])   . PAD (peripheral artery disease) ([place])   . Heart murmur   . Chronic diastolic (congestive) heart failure [facility])     Past Surgical History  Procedure Laterality Date  . Abdominal hysterectomy    . Coronary angioplasty      Dr Tami Ribas  . Sympathectomy Right 01/27/2015    Procedure: RIGHT RADICAL ARTERY SYMPATHECTOMY, RIGHT ULNAR ARTERY SYMPATHECTOMY, DIGITAL SYMPATHECTOMY RIGHT INDEX, MIDDLE, RING SMALL;  Surgeon: Daryll Brod, MD;  Location: [place];  Service: Orthopedics;  Laterality: Right;  . Amputation Right 01/27/2015    Procedure: AMPUTATION DISTAL TIP RIGHT INDEX FINGER;  Surgeon: Daryll Brod, MD;  Location: [place];  Service: Orthopedics;  Laterality: Right;  . Peripheral vascular catheterization N/A 02/15/2015    Procedure: Lower Extremity Angiography;  Surgeon: Adrian Prows, MD;  Location: [place] [facility];  Service: Cardiovascular;  Laterality: N/A;  . Peripheral vascular catheterization Left 04/12/2015    Procedure: Lower Extremity Angiography;  Surgeon: Adrian Prows, MD;  Location: [place] [facility];  Service: Cardiovascular;  Laterality: Left;  . Peripheral vascular catheterization Left 04/12/2015    Procedure: Peripheral Vascular Atherectomy;  Surgeon: Adrian Prows, MD;  Location: [place] [facility];  Service: Cardiovascular;  Laterality: Left;  sfa     . Amputation  Left 04/20/2015    Procedure: Left Below Knee Amputation;  Surgeon: Newt Minion, MD;  Location: [place];  Service: Orthopedics;  Laterality: Left;  . Peripheral arterial stent graft Left 05/17/2015  . Peripheral vascular catheterization N/A 05/17/2015    Procedure: Lower Extremity Intervention;  Surgeon: Adrian Prows, MD;  Location: [place] [facility];  Service: Cardiovascular;  Laterality: N/A;    Family History  Problem Relation Age of Onset  . Diabetes Sister   . Diabetes Brother     Social History:  reports that she quit smoking about 5 months ago. Her smoking use included Cigarettes. She has a 15 pack-year smoking history. She has never used smokeless tobacco. She reports that she drinks alcohol. She reports that she does not use illicit drugs.  Allergies: No Known Allergies  Medications: I have reviewed the patient's current medications.  Results for orders placed or performed during the hospital encounter of 06/02/15 (from the past 48 hour(s))  Basic metabolic panel     Status: Abnormal   Collection Time: 06/02/15  6:20 AM  Result Value Ref Range   Sodium 136 135 - 145 mmol/L   Potassium 4.1 3.5 - 5.1 mmol/L   Chloride 100 (L) 101 - 111 mmol/L   CO2 28 22 - 32 mmol/L   Glucose, Bld 87 65 - 99 mg/dL   BUN 6 6 - 20 mg/dL   Creatinine, Ser 0.43 (L) 0.44 - 1.00 mg/dL   Calcium 8.8 (L) 8.9 - 10.3 mg/dL   GFR calc non Af Amer >60 >60 mL/min   GFR  calc Af Amer >60 >60 mL/min    Comment: (NOTE) The eGFR has been calculated using the CKD EPI equation. This calculation has not been validated in all clinical situations. eGFR's persistently <60 mL/min signify possible Chronic Kidney Disease.    Anion gap 8 5 - 15  CBC with Differential     Status: Abnormal   Collection Time: 06/02/15  6:20 AM  Result Value Ref Range   WBC 4.8 4.0 - 10.5 K/uL   RBC 3.45 (L) 3.87 - 5.11 MIL/uL   Hemoglobin 10.0 (L) 12.0 - 15.0 g/dL   HCT 33.1 (L) 36.0 - 46.0 %   MCV 95.9 78.0 - 100.0 fL   MCH 29.0  26.0 - 34.0 pg   MCHC 30.2 30.0 - 36.0 g/dL   RDW 14.7 11.5 - 15.5 %   Platelets 152 150 - 400 K/uL   Neutrophils Relative % 74 %   Neutro Abs 3.5 1.7 - 7.7 K/uL   Lymphocytes Relative 18 %   Lymphs Abs 0.9 0.7 - 4.0 K/uL   Monocytes Relative 7 %   Monocytes Absolute 0.3 0.1 - 1.0 K/uL   Eosinophils Relative 1 %   Eosinophils Absolute 0.1 0.0 - 0.7 K/uL   Basophils Relative 0 %   Basophils Absolute 0.0 0.0 - 0.1 K/uL  Hepatic function panel     Status: Abnormal   Collection Time: 06/02/15  6:20 AM  Result Value Ref Range   Total Protein 6.7 6.5 - 8.1 g/dL   Albumin 2.8 (L) 3.5 - 5.0 g/dL   AST 23 15 - 41 U/L   ALT 13 (L) 14 - 54 U/L   Alkaline Phosphatase 57 38 - 126 U/L   Total Bilirubin 0.6 0.3 - 1.2 mg/dL   Bilirubin, Direct 0.2 0.1 - 0.5 mg/dL   Indirect Bilirubin 0.4 0.3 - 0.9 mg/dL  Urinalysis, Routine w reflex microscopic (not at [facility])     Status: None   Collection Time: 06/02/15  8:40 AM  Result Value Ref Range   Color, Urine YELLOW YELLOW   APPearance CLEAR CLEAR   Specific Gravity, Urine 1.010 1.005 - 1.030   pH 7.5 5.0 - 8.0   Glucose, UA NEGATIVE NEGATIVE mg/dL   Hgb urine dipstick NEGATIVE NEGATIVE   Bilirubin Urine NEGATIVE NEGATIVE   Ketones, ur NEGATIVE NEGATIVE mg/dL   Protein, ur NEGATIVE NEGATIVE mg/dL   Nitrite NEGATIVE NEGATIVE   Leukocytes, UA NEGATIVE NEGATIVE    Comment: MICROSCOPIC NOT DONE ON URINES WITH NEGATIVE PROTEIN, BLOOD, LEUKOCYTES, NITRITE, OR GLUCOSE <1000 mg/dL.  MRSA PCR Screening     Status: None   Collection Time: 06/02/15 12:00 PM  Result Value Ref Range   MRSA by PCR NEGATIVE NEGATIVE    Comment:        The GeneXpert MRSA Assay (FDA approved for NASAL specimens only), is one component of a comprehensive MRSA colonization surveillance program. It is not intended to diagnose MRSA infection nor to guide or monitor treatment for MRSA infections.   CBC     Status: Abnormal   Collection Time: 06/03/15  4:06 AM  Result Value  Ref Range   WBC 4.5 4.0 - 10.5 K/uL   RBC 3.26 (L) 3.87 - 5.11 MIL/uL   Hemoglobin 9.4 (L) 12.0 - 15.0 g/dL   HCT 31.4 (L) 36.0 - 46.0 %   MCV 96.3 78.0 - 100.0 fL   MCH 28.8 26.0 - 34.0 pg   MCHC 29.9 (L) 30.0 - 36.0 g/dL   RDW  14.8 11.5 - 15.5 %   Platelets 167 150 - 400 K/uL  Basic metabolic panel     Status: Abnormal   Collection Time: 06/03/15  4:06 AM  Result Value Ref Range   Sodium 138 135 - 145 mmol/L   Potassium 3.7 3.5 - 5.1 mmol/L   Chloride 103 101 - 111 mmol/L   CO2 29 22 - 32 mmol/L   Glucose, Bld 123 (H) 65 - 99 mg/dL   BUN 10 6 - 20 mg/dL   Creatinine, Ser 0.48 0.44 - 1.00 mg/dL   Calcium 8.5 (L) 8.9 - 10.3 mg/dL   GFR calc non Af Amer >60 >60 mL/min   GFR calc Af Amer >60 >60 mL/min    Comment: (NOTE) The eGFR has been calculated using the CKD EPI equation. This calculation has not been validated in all clinical situations. eGFR's persistently <60 mL/min signify possible Chronic Kidney Disease.    Anion gap 6 5 - 15    Dg Foot Complete Right  06/02/2015  CLINICAL DATA:  Gangrene of the foot. EXAM: RIGHT FOOT COMPLETE - 3+ VIEW COMPARISON:  None. FINDINGS: There is no fracture or dislocation. No bone destruction to suggest osteomyelitis. Soft tissue swelling of the dorsum of the foot. Arterial vascular calcifications. IMPRESSION: No acute osseous abnormality.  Osteopenia. Electronically Signed   By: Lorriane Shire M.D.   On: 06/02/2015 08:55    Review of Systems  All other systems reviewed and are negative.  Blood pressure 128/40, pulse 79, temperature 99.2 F (37.3 C), temperature source Oral, resp. rate 16, height _0  (1.6 m), weight 39.8 kg (87 lb 11.9 oz), SpO2 98 %. Physical Exam On examination patient has some dementia. She is afebrile no acute systemic illness. No elevated white cell count. Her hemoglobin is stable at 9.4. Examination of her right foot she has thin atrophic skin with dry gangrenous changes of all of her toes. There is no ascending  cellulitis no evidence of an abscess no odor. Assessment/Plan: Assessment: Dry gangrene right forefoot status post left transtibial amputation with severe peripheral vascular disease.  Plan: We'll plan for a right transtibial amputation tomorrow morning Saturday I discussed with the patient's daughter over the phone the risks and benefits of surgery patient's daughter states she understands and agrees to proceed with surgery. Patient will need discharge to skilled nursing postoperatively.  DUDA,MARCUS V 06/03/2015, 10:53 AM

## 2015-06-04 NOTE — Progress Notes (Signed)
ANTIBIOTIC CONSULT NOTE - INITIAL  Pharmacy Consult:  Zosyn Indication:  Aspiration PNA  No Known Allergies  Patient Measurements: Height: 5\' 3"  (160 cm) Weight: 87 lb 11.9 oz (39.8 kg) IBW/kg (Calculated) : 52.4  Vital Signs: Temp: 99.2 F (37.3 C) (11/26 1000) Temp Source: Oral (11/26 0558) BP: 130/59 mmHg (11/26 1000) Pulse Rate: 125 (11/26 1015) Intake/Output from previous day: 11/25 0701 - 11/26 0700 In: 560 [P.O.:460; IV Piggyback:100] Out: 1400 [Urine:1400] Intake/Output from this shift: Total I/O In: 700 [I.V.:700] Out: 60 [Blood:60]  Labs:  Recent Labs  06/02/15 0620 06/03/15 0406 06/04/15 0610  WBC 4.8 4.5 7.8  HGB 10.0* 9.4* 11.6*  PLT 152 167 182  CREATININE 0.43* 0.48 0.46   Estimated Creatinine Clearance: 34.1 mL/min (by C-G formula based on Cr of 0.46). No results for input(s): VANCOTROUGH, VANCOPEAK, VANCORANDOM, GENTTROUGH, GENTPEAK, GENTRANDOM, TOBRATROUGH, TOBRAPEAK, TOBRARND, AMIKACINPEAK, AMIKACINTROU, AMIKACIN in the last 72 hours.   Microbiology: Recent Results (from the past 720 hour(s))  MRSA PCR Screening     Status: None   Collection Time: 05/25/15  4:33 AM  Result Value Ref Range Status   MRSA by PCR NEGATIVE NEGATIVE Final    Comment:        The GeneXpert MRSA Assay (FDA approved for NASAL specimens only), is one component of a comprehensive MRSA colonization surveillance program. It is not intended to diagnose MRSA infection nor to guide or monitor treatment for MRSA infections.   MRSA PCR Screening     Status: None   Collection Time: 06/02/15 12:00 PM  Result Value Ref Range Status   MRSA by PCR NEGATIVE NEGATIVE Final    Comment:        The GeneXpert MRSA Assay (FDA approved for NASAL specimens only), is one component of a comprehensive MRSA colonization surveillance program. It is not intended to diagnose MRSA infection nor to guide or monitor treatment for MRSA infections.   Surgical PCR screen     Status:  Abnormal   Collection Time: 06/03/15  6:12 PM  Result Value Ref Range Status   MRSA, PCR NEGATIVE NEGATIVE Final   Staphylococcus aureus POSITIVE (A) NEGATIVE Final    Comment:        The Xpert SA Assay (FDA approved for NASAL specimens in patients over 79 years of age), is one component of a comprehensive surveillance program.  Test performance has been validated by [facility] for patients greater than or equal to 79 year old. It is not intended to diagnose infection nor to guide or monitor treatment.     Medical History: Past Medical History  Diagnosis Date  . Hypertension   . GERD (gastroesophageal reflux disease)   . Lupus (HCC)     skin, scleraderma  . Scleroderma (HCC)   . Arthritis     scleroderma  . Peripheral vascular disease (HCC)   . PAD (peripheral artery disease) (HCC)   . Heart murmur   . Chronic diastolic (congestive) heart failure (HCC)      Assessment: 81 YOF initially started on vancomycin for gangrenous distal foot and toe.  During right BKA today, patient aspirated.  Pharmacy now consulted to add Zosyn for aspiration PNA.  Patient's renal function has been stable.  Vanc 11/24 >> Zosyn 11/26 >>  11/25 S.aureus PCR - positive 11/25 UCx -   Goal of Therapy:  Vancomycin trough level 15-20 mcg/ml   Plan:  - Continue vanc 500mg  IV Q24H - Zosyn 3.375gm IV Q8H, 4 hr infusion - Monitor  renal fxn, clinical progress, vanc trough tomorrow (dose slightly below for new goal 15-20)   Elexius Minar D. Laney Potash, PharmD, BCPS Pager:  [phone] 06/04/2015, 10:57 AM

## 2015-06-04 NOTE — Anesthesia Procedure Notes (Addendum)
Procedure Name: LMA Insertion Date/Time: 06/04/2015 7:48 AM Performed by: Alanda AmassFRIEDMAN, Talaya Lamprecht A Pre-anesthesia Checklist: Patient identified, Timeout performed, Emergency Drugs available, Suction available and Patient being monitored Patient Re-evaluated:Patient Re-evaluated prior to inductionOxygen Delivery Method: Circle system utilized Preoxygenation: Pre-oxygenation with 100% oxygen Intubation Type: IV induction Ventilation: Mask ventilation without difficulty LMA: LMA inserted LMA Size: 3.0 Number of attempts: 1 Placement Confirmation: positive ETCO2 and breath sounds checked- equal and bilateral Tube secured with: Tape Dental Injury: Teeth and Oropharynx as per pre-operative assessment    Procedure Name: Intubation Date/Time: 06/04/2015 8:14 AM Performed by: Alanda AmassFRIEDMAN, Wallie Lagrand A Pre-anesthesia Checklist: Patient identified, Emergency Drugs available, Suction available and Patient being monitored Oxygen Delivery Method: Circle system utilized Laryngoscope Size: Mac and 3 Grade View: Grade I Tube type: Oral Tube size: 7.5 mm Number of attempts: 1 Airway Equipment and Method: Stylet Placement Confirmation: ETT inserted through vocal cords under direct vision,  positive ETCO2 and breath sounds checked- equal and bilateral Secured at: 21 cm Tube secured with: Tape Dental Injury: Teeth and Oropharynx as per pre-operative assessment

## 2015-06-04 NOTE — Anesthesia Preprocedure Evaluation (Addendum)
Anesthesia Evaluation  Patient identified by MRN, date of birth, ID bandGeneral Assessment Comment:Somnolent, arousable   Reviewed: Allergy & Precautions, NPO status , Patient's Chart, lab work & pertinent test results  History of Anesthesia Complications Negative for: history of anesthetic complications  Airway Mallampati: II  TM Distance: >3 FB Neck ROM: Full    Dental  (+) Edentulous Upper, Edentulous Lower   Pulmonary pneumonia (recent pneumonia 10/16), resolved, former smoker (recently quit),  Room air O2 sat 85%   breath sounds clear to auscultation       Cardiovascular hypertension, Pt. on medications (-) angina+ Peripheral Vascular Disease and +CHF   Rhythm:Regular Rate:Tachycardia  10/16 ECHO; EF 60-65%, grade 2 diastolic dysfunction, mod TR   Neuro/Psych negative neurological ROS     GI/Hepatic Neg liver ROS, GERD  Medicated and Controlled,  Endo/Other  SLE scleroderma  Renal/GU negative Renal ROS     Musculoskeletal   Abdominal   Peds  Hematology  (+) Blood dyscrasia (Hb 11.6), ,   Anesthesia Other Findings   Reproductive/Obstetrics                           Anesthesia Physical Anesthesia Plan  ASA: III  Anesthesia Plan: General   Post-op Pain Management:    Induction: Intravenous  Airway Management Planned: LMA  Additional Equipment:   Intra-op Plan:   Post-operative Plan:   Informed Consent: I have reviewed the patients History and Physical, chart, labs and discussed the procedure including the risks, benefits and alternatives for the proposed anesthesia with the patient or authorized representative who has indicated his/her understanding and acceptance.   Consent reviewed with POA  Plan Discussed with: Surgeon and CRNA  Anesthesia Plan Comments: (Plan routine monitors, GA- LMA OK Discussed with pt's daughter, who understands and accepts.  She agrees with  peri-operative DNR suspension. )        Anesthesia Quick Evaluation

## 2015-06-04 NOTE — Anesthesia Postprocedure Evaluation (Signed)
Anesthesia Post Note  Patient: Cyndia SkeetersJohnetta M Scharfenberg  Procedure(s) Performed: Procedure(s) (LRB): AMPUTATION BELOW KNEE (Right)  Patient location during evaluation: PACU Anesthesia Type: General Level of consciousness: awake and alert and responds to stimulation Pain management: pain level controlled Vital Signs Assessment: post-procedure vital signs reviewed and stable Respiratory status: spontaneous breathing, nonlabored ventilation, respiratory function stable and patient connected to face mask oxygen (observing for S/Sx aspiration pneumonia) Cardiovascular status: blood pressure returned to baseline and stable Anesthetic complications: yes Anesthetic complication details: hospitalist to follow for aspiration pneumonia and respiratory event   Last Vitals:  Filed Vitals:   06/04/15 1000 06/04/15 1015  BP: 130/59   Pulse: 127 125  Temp: 37.3 C   Resp: 25 25    Last Pain:  Filed Vitals:   06/04/15 1021  PainSc: 0-No pain                 Annya Lizana,E. Annalucia Laino

## 2015-06-04 NOTE — Interval H&P Note (Signed)
History and Physical Interval Note:  06/04/2015 7:25 AM  Shannon HooverJohnetta Murriel HopperM Rice  has presented today for surgery, with the diagnosis of gangrene right foot  The various methods of treatment have been discussed with the patient and family. After consideration of risks, benefits and other options for treatment, the patient has consented to  Procedure(s): AMPUTATION BELOW KNEE (Right) as a surgical intervention .  The patient's history has been reviewed, patient examined, no change in status, stable for surgery.  I have reviewed the patient's chart and labs.  Questions were answered to the patient's satisfaction.     Oliviarose Punch V

## 2015-06-04 NOTE — OR Nursing (Signed)
Around 0821 during the OR casr patient vomited aspirated . Dr Jean RosenthalJackson and Joesph JulyScott Friedman at head of bed suction and intubated pt scoped by Dr Jean RosenthalJackson some fluid noted in lung

## 2015-06-04 NOTE — Progress Notes (Addendum)
[facility] PROGRESS NOTE  Shannon Rice ZOX:[id number] DOB: 1930-09-28 DOA: 06/02/2015 PCP: Ralene Ok, MD  Length of stay: 2   Assessment/Plan: Principal Problem:   Cellulitis of right foot Active Problems:   Scleroderma (HCC)   Peripheral artery disease (HCC)   Critical lower limb ischemia   HTN (hypertension)   Protein-calorie malnutrition, severe (HCC)   HLD (hyperlipidemia)   Chronic diastolic (congestive) heart failure (HCC)   Gangrene of foot (HCC)   Pressure ulcer   Brief summary 81 y.o. female, with lupus, scleroderma, diastolic heart failure and severe peripheral artery disease status post left AKA and has had multiple peripheral vascular catheterization catheterizations for critical limb ischemia (05/17/2015). She presents from [facility] skilled nursing for increasing redness and pain in the leg above her right lower extremity gangrenous toes. The patient is mildly, but pleasantly confused and is not a good historian. Her daughter explains that she was called [facility] this morning due to redness. Found to have Acute right lower extremity cellulitis in the setting of known gangrene in the distal digits.   Assessment and plan  Acute right lower extremity cellulitis in the setting of known gangrene in the distal digits Unable to obtain pulse in the ER this morning.   Continue IV vancomycin.  Dr. Lajoyce Corners did Transtibial amputation on the right on 11/26 Cont IV antibiotics for now  Patient is status post Successful atherectomy and drug coated balloon angioplasty of the right external iliac and right common femoral and right superficial femoral artery during her last admission   Probable aspiration during surgery  Dr Jean Rosenthal concerned about aspiration,cont abx and repeat CXR this afternoon She was suctioned aggressively during surgery , add zosyn to vanc  Will keep NPO SLP in am   Severe peripheral artery disease. On aspirin, Plavix. Will  continue these.  Status post Successful atherectomy and drug coated balloon angioplasty of the right external iliac and right common femoral and right superficial femoral artery on 11/8  Chronic Diastolic Heart Failure Patient appears euvolemic. Will monitor closely for volume overload.   Chronic pain Continue MS Contin, oxycodone, Lyrica.   Dyslipidemia Continue Livalo.  Gastroesophageal reflux disease-Protonix  Scleroderma-continue Lyrica    DVT prophylaxsis Lovenox  Code Status:      Code Status Orders        Start     Ordered   06/02/15 0940  Do not attempt resuscitation (DNR)   Continuous    Question Answer Comment  In the event of cardiac or respiratory ARREST Do not call a "code blue"   In the event of cardiac or respiratory ARREST Do not perform Intubation, CPR, defibrillation or ACLS   In the event of cardiac or respiratory ARREST Use medication by any route, position, wound care, and other measures to relive pain and suffering. May use oxygen, suction and manual treatment of airway obstruction as needed for comfort.      06/02/15 8119    Advance Directive Documentation        Most Recent Value   Type of Advance Directive  Out of facility DNR (pink MOST or yellow form)   Pre-existing out of facility DNR order (yellow form or pink MOST form)  Yellow form placed in chart (order not valid for inpatient use)   "MOST" Form in Place?       Family Communication: family updated about patient's clinical progress Disposition Plan: tx to stepdown      Consultants:  Dr. Lajoyce Corners  Procedures:  None  Antibiotics: Anti-infectives    Start     Dose/Rate Route Frequency Ordered Stop   06/03/15 0600  [MAR Hold]  vancomycin (VANCOCIN) 500 mg in sodium chloride 0.9 % 100 mL IVPB     (MAR Hold since 06/04/15 0647)   500 mg 100 mL/hr over 60 Minutes Intravenous Every 24 hours 06/02/15 1908     06/02/15 0945  vancomycin (VANCOCIN) IVPB 1000 mg/200 mL premix  Status:   Discontinued     1,000 mg 200 mL/hr over 60 Minutes Intravenous  Once 06/02/15 0939 06/02/15 0956   06/02/15 0615  vancomycin (VANCOCIN) 500 mg in sodium chloride 0.9 % 100 mL IVPB     500 mg 100 mL/hr over 60 Minutes Intravenous  Once 06/02/15 0614 06/02/15 0808         HPI/Subjective: Postop from surgery , aspirated , will need to be monitored closely   Objective: Filed Vitals:   06/04/15 0905 06/04/15 0915 06/04/15 0930 06/04/15 0945  BP: 160/54 156/55 136/58 132/59  Pulse: 128 127 128 126  Temp: 100.4 F (38 C)     TempSrc:      Resp: Height:      Weight:      SpO2: 100%  100%     Intake/Output Summary (Last 24 hours) at 06/04/15 1002 Last data filed at 06/04/15 0844  Gross per 24 hour  Intake   1260 ml  Output   1460 ml  Net   -200 ml    Exam:  General: No acute respiratory distress Lungs: Clear to auscultation bilaterally without wheezes or crackles Cardiovascular: Regular rate and rhythm without murmur gallop or rub normal S1 and S2 Abdomen: Nontender, nondistended, soft, bowel sounds positive, no rebound, no ascites, no appreciable mass Extremities: Able to move all 4. 5/5 strength in each, no effusions. Left AKA. Right lower extremity has mild erythema in the tibial area, toes are black with gangrene. Dried white pus is surrounding her toes   Data Review   Micro Results Recent Results (from the past 240 hour(s))  MRSA PCR Screening     Status: None   Collection Time: 06/02/15 12:00 PM  Result Value Ref Range Status   MRSA by PCR NEGATIVE NEGATIVE Final    Comment:        The GeneXpert MRSA Assay (FDA approved for NASAL specimens only), is one component of a comprehensive MRSA colonization surveillance program. It is not intended to diagnose MRSA infection nor to guide or monitor treatment for MRSA infections.   Surgical PCR screen     Status: Abnormal   Collection Time: 06/03/15  6:12 PM  Result Value Ref Range Status   MRSA,  PCR NEGATIVE NEGATIVE Final   Staphylococcus aureus POSITIVE (A) NEGATIVE Final    Comment:        The Xpert SA Assay (FDA approved for NASAL specimens in patients over 36 years of age), is one component of a comprehensive surveillance program.  Test performance has been validated by [facility] for patients greater than or equal to 42 year old. It is not intended to diagnose infection nor to guide or monitor treatment.     Radiology Reports Ct Abdomen Pelvis Wo Contrast  05/25/2015  CLINICAL DATA:  Acute onset of dizziness. Anemia. Assess for retroperitoneal bleed. Initial encounter. EXAM: CT ABDOMEN AND PELVIS WITHOUT CONTRAST TECHNIQUE: Multidetector CT imaging of the abdomen and pelvis was performed following the standard protocol  without IV contrast. COMPARISON:  CT of the abdomen and pelvis performed 08/24/2010 FINDINGS: There is no evidence of retroperitoneal hemorrhage. Mild diffuse soft tissue edema is noted about the abdomen and pelvis, likely reflecting mild anasarca. Trace bilateral pleural fluid is noted, with bibasilar atelectasis. Mild biatrial enlargement is noted. Diffuse coronary artery calcifications are seen. The liver and spleen are unremarkable in appearance. The gallbladder is within normal limits. The pancreas and adrenal glands are unremarkable. A few small hyperdense foci within the kidneys may reflect small hyperdense cysts. There is no evidence of hydronephrosis. A 2 mm stone is noted at the upper pole of the right kidney. No obstructing ureteral stones are seen. No perinephric stranding is appreciated. No free fluid is identified. The small bowel is unremarkable in appearance. The stomach is within normal limits. No acute vascular abnormalities are seen. Diffuse calcification is noted along the abdominal aorta and its branches. The appendix is normal in caliber, without evidence of appendicitis. The colon is unremarkable in appearance. The bladder is significantly  distended and grossly unremarkable. The patient is status post hysterectomy. No suspicious adnexal masses are seen. No inguinal lymphadenopathy is seen. No acute osseous abnormalities are identified. A chronic right-sided pars defect is noted at L5. IMPRESSION: 1. No evidence of acute hemorrhage. 2. Mild diffuse soft tissue edema about the abdomen and pelvis, likely reflecting mild anasarca. 3. Trace bilateral pleural fluid, with bibasilar atelectasis. 4. Mild biatrial enlargement noted. 5. Diffuse coronary artery calcifications seen. 6. Diffuse calcification along the abdominal aorta and branches. 7. Few small hyperdense foci within the kidneys may reflect small hyperdense cysts. 2 mm nonobstructing stone at the upper pole of the right kidney. 8. Chronic right-sided pars defect at L5. Electronically Signed   By: Roanna RaiderJeffery  Chang M.D.   On: 05/25/2015 02:55   Dg Foot Complete Right  06/02/2015  CLINICAL DATA:  Gangrene of the foot. EXAM: RIGHT FOOT COMPLETE - 3+ VIEW COMPARISON:  None. FINDINGS: There is no fracture or dislocation. No bone destruction to suggest osteomyelitis. Soft tissue swelling of the dorsum of the foot. Arterial vascular calcifications. IMPRESSION: No acute osseous abnormality.  Osteopenia. Electronically Signed   By: Francene BoyersJames  Maxwell M.D.   On: 06/02/2015 08:55     CBC  Recent Labs Lab 06/02/15 0620 06/03/15 0406 06/04/15 0610  WBC 4.8 4.5 7.8  HGB 10.0* 9.4* 11.6*  HCT 33.1* 31.4* 37.8  PLT 152 167 182  MCV 95.9 96.3 96.9  MCH 29.0 28.8 29.7  MCHC 30.2 29.9* 30.7  RDW 14.7 14.8 14.6  LYMPHSABS 0.9  --   --   MONOABS 0.3  --   --   EOSABS 0.1  --   --   BASOSABS 0.0  --   --     Chemistries   Recent Labs Lab 06/02/15 0620 06/03/15 0406 06/04/15 0610  NA 136 138 135  K 4.1 3.7 3.9  CL 100* 103 101  CO2 28 29 26   GLUCOSE 87 123* 104*  BUN 6 10 7   CREATININE 0.43* 0.48 0.46  CALCIUM 8.8* 8.5* 8.8*  AST 23  --  23  ALT 13*  --  12*  ALKPHOS 57  --  66   BILITOT 0.6  --  0.5   ------------------------------------------------------------------------------------------------------------------ estimated creatinine clearance is 34.1 mL/min (by C-G formula based on Cr of 0.46). ------------------------------------------------------------------------------------------------------------------ No results for input(s): HGBA1C in the last 72 hours. ------------------------------------------------------------------------------------------------------------------ No results for input(s): CHOL, HDL, LDLCALC, TRIG, CHOLHDL, LDLDIRECT in the last 72  hours. ------------------------------------------------------------------------------------------------------------------ No results for input(s): TSH, T4TOTAL, T3FREE, THYROIDAB in the last 72 hours.  Invalid input(s): FREET3 ------------------------------------------------------------------------------------------------------------------ No results for input(s): VITAMINB12, FOLATE, FERRITIN, TIBC, IRON, RETICCTPCT in the last 72 hours.  Coagulation profile No results for input(s): INR, PROTIME in the last 168 hours.  No results for input(s): DDIMER in the last 72 hours.  Cardiac Enzymes No results for input(s): CKMB, TROPONINI, MYOGLOBIN in the last 168 hours.  Invalid input(s): CK ------------------------------------------------------------------------------------------------------------------ Invalid input(s): POCBNP   CBG: No results for input(s): GLUCAP in the last 168 hours.     Studies: No results found.    No results found for: HGBA1C Lab Results  Component Value Date   LDLCALC 43 05/25/2015   CREATININE 0.46 06/04/2015       Scheduled Meds: . [MAR Hold] amLODipine  5 mg Oral Daily  . [MAR Hold] aspirin EC  81 mg Oral Daily  . [MAR Hold] benazepril  20 mg Oral Daily  . [MAR Hold] clopidogrel  75 mg Oral Daily  . [MAR Hold] docusate sodium  100 mg Oral BID  . [MAR Hold]  enoxaparin (LOVENOX) injection  30 mg Subcutaneous Q24H  . [MAR Hold] lactose free nutrition  237 mL Oral TID WC  . [MAR Hold] morphine  15 mg Oral Q12H  . [MAR Hold] multivitamin with minerals  1 tablet Oral Daily  . [MAR Hold] pantoprazole  40 mg Oral Daily  . [MAR Hold] pravastatin  80 mg Oral q1800  . [MAR Hold] pregabalin  50 mg Oral TID  . [MAR Hold] vancomycin  500 mg Intravenous Q24H   Continuous Infusions: . sodium chloride 50 mL/hr at 06/03/15 1033  . lactated ringers 10 mL/hr at 06/04/15 0703    Principal Problem:   Cellulitis of right foot Active Problems:   Scleroderma (HCC)   Peripheral artery disease (HCC)   Critical lower limb ischemia   HTN (hypertension)   Protein-calorie malnutrition, severe (HCC)   HLD (hyperlipidemia)   Chronic diastolic (congestive) heart failure (HCC)   Gangrene of foot (HCC)   Pressure ulcer    Time spent: 45 minutes   [facility]  [facility] Pager [phone]. If 7PM-7AM, please contact night-coverage at [email or web address], password [facility] 06/04/2015, 10:02 AM  LOS: 2 days

## 2015-06-04 NOTE — Progress Notes (Signed)
Patient is 81 year old with h/o SLE and scleroderma who underwent BKA under GA with LMA this morning Shannon Rice(Duda).  She had large amount of emesis intraop, was suctioned aggressively and intubated.  Although emesis was suctioned from ETT, she also had large mucus plug, with no emesis evident on bronch in trachea and L bronchus, very scant secretions in R bronchus after suctioning the plug.  VSS, pt is not tachypneic or labored in respiration.  Discussed with Dr. Susie CassetteAbrol, hospitalist, and she will manage this patient with potential aspiration pneumonia/pneumonitis post operatively.  Sandford Craze Ashleigh Luckow, MD

## 2015-06-05 ENCOUNTER — Inpatient Hospital Stay (HOSPITAL_COMMUNITY): Payer: Medicare Other

## 2015-06-05 LAB — COMPREHENSIVE METABOLIC PANEL
ALT: 10 U/L — ABNORMAL LOW (ref 14–54)
AST: 26 U/L (ref 15–41)
Albumin: 2.4 g/dL — ABNORMAL LOW (ref 3.5–5.0)
Alkaline Phosphatase: 48 U/L (ref 38–126)
Anion gap: 11 (ref 5–15)
BUN: 14 mg/dL (ref 6–20)
CO2: 26 mmol/L (ref 22–32)
Calcium: 8.1 mg/dL — ABNORMAL LOW (ref 8.9–10.3)
Chloride: 96 mmol/L — ABNORMAL LOW (ref 101–111)
Creatinine, Ser: 0.5 mg/dL (ref 0.44–1.00)
GFR calc Af Amer: >60 mL/min (ref >60)
GFR calc non Af Amer: >60 mL/min (ref >60)
Glucose, Bld: 93 mg/dL (ref 65–99)
Potassium: 3.6 mmol/L (ref 3.5–5.1)
Sodium: 133 mmol/L — ABNORMAL LOW (ref 135–145)
Total Bilirubin: 0.4 mg/dL (ref 0.3–1.2)
Total Protein: 6.2 g/dL — ABNORMAL LOW (ref 6.5–8.1)

## 2015-06-05 LAB — URINE CULTURE: Culture: 40000

## 2015-06-05 LAB — CBC
HEMATOCRIT: 29.1 % — AB (ref 36.0–46.0)
HEMOGLOBIN: 9 g/dL — AB (ref 12.0–15.0)
MCH: 29.4 pg (ref 26.0–34.0)
MCHC: 30.9 g/dL (ref 30.0–36.0)
MCV: 95.1 fL (ref 78.0–100.0)
Platelets: 185 10*3/uL (ref 150–400)
RBC: 3.06 MIL/uL — ABNORMAL LOW (ref 3.87–5.11)
RDW: 14.7 % (ref 11.5–15.5)
WBC: 8.8 10*3/uL (ref 4.0–10.5)

## 2015-06-05 LAB — VANCOMYCIN, TROUGH: Vancomycin Tr: <4 ug/mL — ABNORMAL LOW (ref 10.0–20.0)

## 2015-06-05 MED ORDER — VANCOMYCIN HCL IN DEXTROSE 1-5 GM/200ML-% IV SOLN
1000.0000 mg | INTRAVENOUS | Status: DC
  Administered 2015-06-05 – 2015-06-07 (×3): 1000 mg via INTRAVENOUS
  Filled 2015-06-05 (×3): qty 200

## 2015-06-05 MED ORDER — SODIUM CHLORIDE 0.9 % IV BOLUS (SEPSIS)
500.0000 mL | Freq: Once | INTRAVENOUS | Status: AC
  Administered 2015-06-05: 500 mL via INTRAVENOUS

## 2015-06-05 MED ORDER — METOPROLOL TARTRATE 1 MG/ML IV SOLN
INTRAVENOUS | Status: AC
  Filled 2015-06-05: qty 5

## 2015-06-05 MED ORDER — BENAZEPRIL HCL 20 MG PO TABS
10.0000 mg | ORAL_TABLET | Freq: Every day | ORAL | Status: DC
  Administered 2015-06-06 – 2015-06-07 (×2): 10 mg via ORAL
  Filled 2015-06-05 (×3): qty 1

## 2015-06-05 MED ORDER — LEVALBUTEROL HCL 0.63 MG/3ML IN NEBU
0.6300 mg | INHALATION_SOLUTION | Freq: Four times a day (QID) | RESPIRATORY_TRACT | Status: DC | PRN
  Administered 2015-06-05 – 2015-06-07 (×3): 0.63 mg via RESPIRATORY_TRACT
  Filled 2015-06-05 (×3): qty 3

## 2015-06-05 MED ORDER — GUAIFENESIN ER 600 MG PO TB12
600.0000 mg | ORAL_TABLET | Freq: Two times a day (BID) | ORAL | Status: DC
  Administered 2015-06-05 – 2015-06-10 (×9): 600 mg via ORAL
  Filled 2015-06-05 (×11): qty 1

## 2015-06-05 MED ORDER — METOPROLOL TARTRATE 1 MG/ML IV SOLN
2.5000 mg | Freq: Once | INTRAVENOUS | Status: AC
  Administered 2015-06-05: 2.5 mg via INTRAVENOUS

## 2015-06-05 NOTE — Progress Notes (Signed)
Notified Md pt now in AFib.  Hr 107-130.  Pt pulled iv out.  New Iv obtained.  ekg obtained.  New orders received.  Will continue to monitor Shannon AddisonCoro, [name]

## 2015-06-05 NOTE — Progress Notes (Signed)
Noted that every time pt .take something by mouth starts to come out.Family claimed that pt . been doing this. Continue to monitor.

## 2015-06-05 NOTE — Progress Notes (Signed)
ANTIBIOTIC CONSULT NOTE - FOLLOW UP  Pharmacy Consult for Vancomycin Indication: Cellulitis  No Known Allergies  Patient Measurements: Height: 5\' 3"  (160 cm) Weight: 85 lb 12.1 oz (38.9 kg) IBW/kg (Calculated) : 52.4  Vital Signs: Temp: 98.8 F (37.1 C) (11/27 0340) Temp Source: Oral (11/27 0340) BP: 135/47 mmHg (11/27 0340) Pulse Rate: 95 (11/27 0340)  Labs:  Recent Labs  06/03/15 0406 06/04/15 0610 06/05/15 0523  WBC 4.5 7.8  --   HGB 9.4* 11.6*  --   PLT 167 182  --   CREATININE 0.48 0.46 0.50   Estimated Creatinine Clearance: 33.3 mL/min (by C-G formula based on Cr of 0.5).  Recent Labs  06/05/15 0523  VANCOTROUGH <4*     Microbiology: Recent Results (from the past 720 hour(s))  MRSA PCR Screening     Status: None   Collection Time: 05/25/15  4:33 AM  Result Value Ref Range Status   MRSA by PCR NEGATIVE NEGATIVE Final    Comment:        The GeneXpert MRSA Assay (FDA approved for NASAL specimens only), is one component of a comprehensive MRSA colonization surveillance program. It is not intended to diagnose MRSA infection nor to guide or monitor treatment for MRSA infections.   MRSA PCR Screening     Status: None   Collection Time: 06/02/15 12:00 PM  Result Value Ref Range Status   MRSA by PCR NEGATIVE NEGATIVE Final    Comment:        The GeneXpert MRSA Assay (FDA approved for NASAL specimens only), is one component of a comprehensive MRSA colonization surveillance program. It is not intended to diagnose MRSA infection nor to guide or monitor treatment for MRSA infections.   Surgical PCR screen     Status: Abnormal   Collection Time: 06/03/15  6:12 PM  Result Value Ref Range Status   MRSA, PCR NEGATIVE NEGATIVE Final   Staphylococcus aureus POSITIVE (A) NEGATIVE Final    Comment:        The Xpert SA Assay (FDA approved for NASAL specimens in patients over 79 years of age), is one component of a comprehensive surveillance program.   Test performance has been validated by [facility] for patients greater than or equal to 79 year old. It is not intended to diagnose infection nor to guide or monitor treatment.   Urine culture     Status: None (Preliminary result)   Collection Time: 06/03/15  8:22 PM  Result Value Ref Range Status   Specimen Description URINE, RANDOM  Final   Special Requests NONE  Final   Culture 40,000 COLONIES/ml ESCHERICHIA COLI  Final   Report Status PENDING  Incomplete    Anti-infectives    Start     Dose/Rate Route Frequency Ordered Stop   06/05/15 0640  vancomycin (VANCOCIN) IVPB 1000 mg/200 mL premix     1,000 mg 200 mL/hr over 60 Minutes Intravenous Every 24 hours 06/05/15 0641     06/04/15 1200  piperacillin-tazobactam (ZOSYN) IVPB 3.375 g     3.375 g 12.5 mL/hr over 240 Minutes Intravenous Every 8 hours 06/04/15 1055     06/03/15 0600  vancomycin (VANCOCIN) 500 mg in sodium chloride 0.9 % 100 mL IVPB  Status:  Discontinued     500 mg 100 mL/hr over 60 Minutes Intravenous Every 24 hours 06/02/15 1908 06/05/15 0641   06/02/15 0945  vancomycin (VANCOCIN) IVPB 1000 mg/200 mL premix  Status:  Discontinued     1,000 mg 200  mL/hr over 60 Minutes Intravenous  Once 06/02/15 0939 06/02/15 0956   06/02/15 0615  vancomycin (VANCOCIN) 500 mg in sodium chloride 0.9 % 100 mL IVPB     500 mg 100 mL/hr over 60 Minutes Intravenous  Once 06/02/15 1610 06/02/15 9604      Assessment: Sub-therapeutic vancomycin trough, drawn correctly   Goal of Therapy:  Vancomycin trough level 10-15 mcg/ml  Plan:  -Increase vancomycin to 1000 mg IV q24h -Re-check vancomycin trough at steady state -F/U length of treatment now that pt is s/p amputation of the culprit foot  Abran Duke 06/05/2015,6:41 AM

## 2015-06-05 NOTE — Progress Notes (Signed)
[facility] PROGRESS NOTE  Shannon Rice ZOX:[id number] DOB: 01-11-1931 DOA: 06/02/2015 PCP: Ralene Ok, MD  Length of stay: 3   Assessment/Plan: Principal Problem:   Cellulitis of right foot Active Problems:   Scleroderma (HCC)   Peripheral artery disease (HCC)   Critical lower limb ischemia   HTN (hypertension)   Protein-calorie malnutrition, severe (HCC)   HLD (hyperlipidemia)   Chronic diastolic (congestive) heart failure (HCC)   Gangrene of foot (HCC)   Pressure ulcer   Brief summary 81 y.o. female, with lupus, scleroderma, diastolic heart failure and severe peripheral artery disease status post left AKA and has had multiple peripheral vascular catheterization catheterizations for critical limb ischemia (05/17/2015). She presents from [facility] skilled nursing for increasing redness and pain in the leg above her right lower extremity gangrenous toes. The patient is mildly, but pleasantly confused and is not a good historian. Her daughter explains that she was called by [facility] this morning due to redness. Found to have Acute right lower extremity cellulitis in the setting of known gangrene in the distal digits.   Assessment and plan  Acute right lower extremity cellulitis in the setting of known gangrene in the distal digits Continue IV vancomycin/Zosyn.  Dr. Lajoyce Corners performed Transtibial amputation on the right on 11/26 Cont IV antibiotics for now in the setting of aspiration pneumonia Previously, she is status post Successful atherectomy and drug coated balloon angioplasty of the right external iliac and right common femoral and right superficial femoral artery during her last admission Hemoglobin 9.0 down from 11.6, will transfuse for hemoglobin less than 8  Probable aspiration during surgery  Dr Jean Rosenthal concerned about aspiration,cont abx, repeat chest x-ray shows new infiltrate She was suctioned aggressively during surgery , continue zosyn to  vanc  Now on Modified diet with aspiration precautions Off Ventimask, now requiring 2 L of oxygen by nasal cannula Received one dose of Lasix 1 yesterday Hemodynamically stable   Severe peripheral artery disease. On aspirin, Plavix. Will continue these.  Status post Successful atherectomy and drug coated balloon angioplasty of the right external iliac and right  common femoral and right superficial femoral artery on 11/8   Chronic Diastolic Heart Failure Patient appears euvolemic. Will monitor closely for volume overload.   Chronic pain Continue MS Contin, oxycodone, Lyrica.   Dyslipidemia Continue Livalo.  Gastroesophageal reflux disease-Protonix  Scleroderma-continue Lyrica    DVT prophylaxsis Lovenox  Code Status:      Code Status Orders        Start     Ordered   06/02/15 0940  Do not attempt resuscitation (DNR)   Continuous    Question Answer Comment  In the event of cardiac or respiratory ARREST Do not call a "code blue"   In the event of cardiac or respiratory ARREST Do not perform Intubation, CPR, defibrillation or ACLS   In the event of cardiac or respiratory ARREST Use medication by any route, position, wound care, and other measures to relive pain and suffering. May use oxygen, suction and manual treatment of airway obstruction as needed for comfort.      06/02/15 8119    Advance Directive Documentation        Most Recent Value   Type of Advance Directive  Out of facility DNR (pink MOST or yellow form)   Pre-existing out of facility DNR order (yellow form or pink MOST form)  Yellow form placed in chart (order not valid for inpatient use)   "  MOST" Form in Place?       Family Communication: family updated about patient's clinical progress Disposition Plan: tx to stepdown      Consultants:  Dr. Lajoyce Cornersuda  Procedures:  None  Antibiotics: Anti-infectives    Start     Dose/Rate Route Frequency Ordered Stop   06/05/15 0640  vancomycin  (VANCOCIN) IVPB 1000 mg/200 mL premix     1,000 mg 200 mL/hr over 60 Minutes Intravenous Every 24 hours 06/05/15 0641     06/04/15 1200  piperacillin-tazobactam (ZOSYN) IVPB 3.375 g     3.375 g 12.5 mL/hr over 240 Minutes Intravenous Every 8 hours 06/04/15 1055     06/03/15 0600  vancomycin (VANCOCIN) 500 mg in sodium chloride 0.9 % 100 mL IVPB  Status:  Discontinued     500 mg 100 mL/hr over 60 Minutes Intravenous Every 24 hours 06/02/15 1908 06/05/15 0641   06/02/15 0945  vancomycin (VANCOCIN) IVPB 1000 mg/200 mL premix  Status:  Discontinued     1,000 mg 200 mL/hr over 60 Minutes Intravenous  Once 06/02/15 0939 06/02/15 0956   06/02/15 0615  vancomycin (VANCOCIN) 500 mg in sodium chloride 0.9 % 100 mL IVPB     500 mg 100 mL/hr over 60 Minutes Intravenous  Once 06/02/15 0614 06/02/15 0808         HPI/Subjective: Postop from surgery , aspirated , will need to be monitored closely   Objective: Filed Vitals:   06/04/15 2338 06/05/15 0340 06/05/15 0741 06/05/15 1141  BP: 91/80 135/47 130/52 110/48  Pulse: 95 95 96 93  Temp: 98.9 F (37.2 C) 98.8 F (37.1 C) 100 F (37.8 C) 98.4 F (36.9 C)  TempSrc: Oral Oral Oral Oral  Resp: 18 18 15 23   Height:      Weight:  38.9 kg (85 lb 12.1 oz)    SpO2: 100% 98% 91% 97%    Intake/Output Summary (Last 24 hours) at 06/05/15 1314 Last data filed at 06/05/15 1300  Gross per 24 hour  Intake 526.67 ml  Output      0 ml  Net 526.67 ml    Exam:  General: No acute respiratory distress Lungs: Clear to auscultation bilaterally without wheezes or crackles Cardiovascular: Regular rate and rhythm without murmur gallop or rub normal S1 and S2 Abdomen: Nontender, nondistended, soft, bowel sounds positive, no rebound, no ascites, no appreciable mass Extremities: Able to move all 4. 5/5 strength in each, no effusions. Left AKA. Right lower extremity has mild erythema in the tibial area, toes are black with gangrene. Dried white pus is  surrounding her toes   Data Review   Micro Results Recent Results (from the past 240 hour(s))  MRSA PCR Screening     Status: None   Collection Time: 06/02/15 12:00 PM  Result Value Ref Range Status   MRSA by PCR NEGATIVE NEGATIVE Final    Comment:        The GeneXpert MRSA Assay (FDA approved for NASAL specimens only), is one component of a comprehensive MRSA colonization surveillance program. It is not intended to diagnose MRSA infection nor to guide or monitor treatment for MRSA infections.   Surgical PCR screen     Status: Abnormal   Collection Time: 06/03/15  6:12 PM  Result Value Ref Range Status   MRSA, PCR NEGATIVE NEGATIVE Final   Staphylococcus aureus POSITIVE (A) NEGATIVE Final    Comment:        The Xpert SA Assay (FDA approved for NASAL specimens  in patients over 64 years of age), is one component of a comprehensive surveillance program.  Test performance has been validated by [facility] for patients greater than or equal to 30 year old. It is not intended to diagnose infection nor to guide or monitor treatment.   Urine culture     Status: None   Collection Time: 06/03/15  8:22 PM  Result Value Ref Range Status   Specimen Description URINE, RANDOM  Final   Special Requests NONE  Final   Culture 40,000 COLONIES/ml ESCHERICHIA COLI  Final   Report Status 06/05/2015 FINAL  Final   Organism ID, Bacteria ESCHERICHIA COLI  Final      Susceptibility   Escherichia coli - MIC*    AMPICILLIN <=2 SENSITIVE Sensitive     CEFAZOLIN <=4 SENSITIVE Sensitive     CEFTRIAXONE <=1 SENSITIVE Sensitive     CIPROFLOXACIN <=0.25 SENSITIVE Sensitive     GENTAMICIN <=1 SENSITIVE Sensitive     IMIPENEM <=0.25 SENSITIVE Sensitive     NITROFURANTOIN <=16 SENSITIVE Sensitive     TRIMETH/SULFA <=20 SENSITIVE Sensitive     AMPICILLIN/SULBACTAM <=2 SENSITIVE Sensitive     PIP/TAZO <=4 SENSITIVE Sensitive     * 40,000 COLONIES/ml ESCHERICHIA COLI    Radiology Reports Ct  Abdomen Pelvis Wo Contrast  05/25/2015  CLINICAL DATA:  Acute onset of dizziness. Anemia. Assess for retroperitoneal bleed. Initial encounter. EXAM: CT ABDOMEN AND PELVIS WITHOUT CONTRAST TECHNIQUE: Multidetector CT imaging of the abdomen and pelvis was performed following the standard protocol without IV contrast. COMPARISON:  CT of the abdomen and pelvis performed 08/24/2010 FINDINGS: There is no evidence of retroperitoneal hemorrhage. Mild diffuse soft tissue edema is noted about the abdomen and pelvis, likely reflecting mild anasarca. Trace bilateral pleural fluid is noted, with bibasilar atelectasis. Mild biatrial enlargement is noted. Diffuse coronary artery calcifications are seen. The liver and spleen are unremarkable in appearance. The gallbladder is within normal limits. The pancreas and adrenal glands are unremarkable. A few small hyperdense foci within the kidneys may reflect small hyperdense cysts. There is no evidence of hydronephrosis. A 2 mm stone is noted at the upper pole of the right kidney. No obstructing ureteral stones are seen. No perinephric stranding is appreciated. No free fluid is identified. The small bowel is unremarkable in appearance. The stomach is within normal limits. No acute vascular abnormalities are seen. Diffuse calcification is noted along the abdominal aorta and its branches. The appendix is normal in caliber, without evidence of appendicitis. The colon is unremarkable in appearance. The bladder is significantly distended and grossly unremarkable. The patient is status post hysterectomy. No suspicious adnexal masses are seen. No inguinal lymphadenopathy is seen. No acute osseous abnormalities are identified. A chronic right-sided pars defect is noted at L5. IMPRESSION: 1. No evidence of acute hemorrhage. 2. Mild diffuse soft tissue edema about the abdomen and pelvis, likely reflecting mild anasarca. 3. Trace bilateral pleural fluid, with bibasilar atelectasis. 4. Mild  biatrial enlargement noted. 5. Diffuse coronary artery calcifications seen. 6. Diffuse calcification along the abdominal aorta and branches. 7. Few small hyperdense foci within the kidneys may reflect small hyperdense cysts. 2 mm nonobstructing stone at the upper pole of the right kidney. 8. Chronic right-sided pars defect at L5. Electronically Signed   By: Roanna Raider M.D.   On: 05/25/2015 02:55   Dg Chest Port 1 View  06/05/2015  CLINICAL DATA:  Fever EXAM: PORTABLE CHEST 1 VIEW COMPARISON:  06/04/2015 FINDINGS: Cardiomegaly again noted. No pulmonary  edema. Persistent streaky left perihilar atelectasis or infiltrate. New streaky atelectasis or infiltrate in right upper lobe. IMPRESSION: Persistent left perihilar atelectasis or infiltrate. New streaky atelectasis or infiltrate in right upper lobe. Cardiomegaly again noted. Electronically Signed   By: Natasha Mead M.D.   On: 06/05/2015 09:31   Dg Chest Port 1 View  06/04/2015  CLINICAL DATA:  Postop, intubated, possible aspiration EXAM: PORTABLE CHEST 1 VIEW COMPARISON:  04/17/2015 FINDINGS: Cardiomediastinal silhouette is stable. Mild hyperinflation. Stable left basilar atelectasis or scarring. There is left perihilar atelectasis or infiltrate. No pulmonary edema pre IMPRESSION: Mild hyperinflation. Cardiomegaly again noted. Left perihilar atelectasis or infiltrate. No pulmonary edema. Electronically Signed   By: Natasha Mead M.D.   On: 06/04/2015 11:03   Dg Foot Complete Right  06/02/2015  CLINICAL DATA:  Gangrene of the foot. EXAM: RIGHT FOOT COMPLETE - 3+ VIEW COMPARISON:  None. FINDINGS: There is no fracture or dislocation. No bone destruction to suggest osteomyelitis. Soft tissue swelling of the dorsum of the foot. Arterial vascular calcifications. IMPRESSION: No acute osseous abnormality.  Osteopenia. Electronically Signed   By: Francene Boyers M.D.   On: 06/02/2015 08:55     CBC  Recent Labs Lab 06/02/15 0620 06/03/15 0406 06/04/15 0610  06/05/15 0523  WBC 4.8 4.5 7.8 8.8  HGB 10.0* 9.4* 11.6* 9.0*  HCT 33.1* 31.4* 37.8 29.1*  PLT 152 167 182 185  MCV 95.9 96.3 96.9 95.1  MCH 29.0 28.8 29.7 29.4  MCHC 30.2 29.9* 30.7 30.9  RDW 14.7 14.8 14.6 14.7  LYMPHSABS 0.9  --   --   --   MONOABS 0.3  --   --   --   EOSABS 0.1  --   --   --   BASOSABS 0.0  --   --   --     Chemistries   Recent Labs Lab 06/02/15 0620 06/03/15 0406 06/04/15 0610 06/05/15 0523  NA 136 138 135 133*  K 4.1 3.7 3.9 3.6  CL 100* 103 101 96*  CO2 GLUCOSE 87 123* 104* 93  BUN CREATININE 0.43* 0.48 0.46 0.50  CALCIUM 8.8* 8.5* 8.8* 8.1*  AST 23  --  23 26  ALT 13*  --  12* 10*  ALKPHOS 57  --  66 48  BILITOT 0.6  --  0.5 0.4   ------------------------------------------------------------------------------------------------------------------ estimated creatinine clearance is 33.3 mL/min (by C-G formula based on Cr of 0.5). ------------------------------------------------------------------------------------------------------------------ No results for input(s): HGBA1C in the last 72 hours. ------------------------------------------------------------------------------------------------------------------ No results for input(s): CHOL, HDL, LDLCALC, TRIG, CHOLHDL, LDLDIRECT in the last 72 hours. ------------------------------------------------------------------------------------------------------------------ No results for input(s): TSH, T4TOTAL, T3FREE, THYROIDAB in the last 72 hours.  Invalid input(s): FREET3 ------------------------------------------------------------------------------------------------------------------ No results for input(s): VITAMINB12, FOLATE, FERRITIN, TIBC, IRON, RETICCTPCT in the last 72 hours.  Coagulation profile No results for input(s): INR, PROTIME in the last 168 hours.  No results for input(s): DDIMER in the last 72 hours.  Cardiac Enzymes No results for input(s): CKMB, TROPONINI,  MYOGLOBIN in the last 168 hours.  Invalid input(s): CK ------------------------------------------------------------------------------------------------------------------ Invalid input(s): POCBNP   CBG: No results for input(s): GLUCAP in the last 168 hours.     Studies: Dg Chest Port 1 View  06/05/2015  CLINICAL DATA:  Fever EXAM: PORTABLE CHEST 1 VIEW COMPARISON:  06/04/2015 FINDINGS: Cardiomegaly again noted. No pulmonary edema. Persistent streaky left perihilar atelectasis or infiltrate. New streaky atelectasis or infiltrate in right upper lobe. IMPRESSION: Persistent  left perihilar atelectasis or infiltrate. New streaky atelectasis or infiltrate in right upper lobe. Cardiomegaly again noted. Electronically Signed   By: Natasha Mead M.D.   On: 06/05/2015 09:31   Dg Chest Port 1 View  06/04/2015  CLINICAL DATA:  Postop, intubated, possible aspiration EXAM: PORTABLE CHEST 1 VIEW COMPARISON:  04/17/2015 FINDINGS: Cardiomediastinal silhouette is stable. Mild hyperinflation. Stable left basilar atelectasis or scarring. There is left perihilar atelectasis or infiltrate. No pulmonary edema pre IMPRESSION: Mild hyperinflation. Cardiomegaly again noted. Left perihilar atelectasis or infiltrate. No pulmonary edema. Electronically Signed   By: Natasha Mead M.D.   On: 06/04/2015 11:03      No results found for: HGBA1C Lab Results  Component Value Date   LDLCALC 43 05/25/2015   CREATININE 0.50 06/05/2015       Scheduled Meds: . amLODipine  5 mg Oral Daily  . antiseptic oral rinse  7 mL Mouth Rinse BID  . aspirin EC  81 mg Oral Daily  . benazepril  20 mg Oral Daily  . clopidogrel  75 mg Oral Daily  . docusate sodium  100 mg Oral BID  . enoxaparin (LOVENOX) injection  30 mg Subcutaneous Q24H  . lactose free nutrition  237 mL Oral TID WC  . morphine  15 mg Oral Q12H  . multivitamin with minerals  1 tablet Oral Daily  . pantoprazole  40 mg Oral Daily  . piperacillin-tazobactam (ZOSYN)  IV   3.375 g Intravenous Q8H  . pravastatin  80 mg Oral q1800  . pregabalin  50 mg Oral TID  . vancomycin  1,000 mg Intravenous Q24H   Continuous Infusions: . sodium chloride 10 mL/hr at 06/05/15 0600  . sodium chloride    . lactated ringers Stopped (06/04/15 1237)    Principal Problem:   Cellulitis of right foot Active Problems:   Scleroderma (HCC)   Peripheral artery disease (HCC)   Critical lower limb ischemia   HTN (hypertension)   Protein-calorie malnutrition, severe (HCC)   HLD (hyperlipidemia)   Chronic diastolic (congestive) heart failure (HCC)   Gangrene of foot (HCC)   Pressure ulcer    Time spent: 45 minutes   [facility]  [facility] Pager [phone]. If 7PM-7AM, please contact night-coverage at [email or web address], password [facility] 06/05/2015, 1:14 PM  LOS: 3 days

## 2015-06-05 NOTE — Evaluation (Signed)
Physical Therapy Evaluation Patient Details Name: Shannon Rice MRN: [id number] DOB: 1930/11/27 Today's Date: 06/05/2015   History of Present Illness  Shannon Rice is a 81 y.o. female, with lupus, scleroderma, diastolic heart failure and severe peripheral artery disease status post left AKA and has had multiple peripheral vascular catheterization catheterizations for critical limb ischemia (05/17/2015). She presents from [facility] skilled nursing for increasing redness and pain in the leg above her right lower extremity gangrenous toes. s/p R BKA on 11/26; aspiration during surgery  Clinical Impression   Patient is s/p above surgery with the complication of aspiration during surgery resulting in functional limitations due to the deficits listed below (see PT Problem List).  Patient will benefit from skilled PT to increase their independence and safety with mobility to allow discharge to the venue listed below.       Follow Up Recommendations SNF    Equipment Recommendations  None recommended by PT    Recommendations for Other Services       Precautions / Restrictions Precautions Precautions: Fall Precaution Comments: previous L BKA, now with R BKA      Mobility  Bed Mobility Overal bed mobility: Needs Assistance Bed Mobility: Rolling;Sidelying to Sit Rolling: Mod assist Sidelying to sit: Mod assist       General bed mobility comments: used bed pad to fully stack hip sin sidelying; cues to flex hips and then proceed to pushing to sit; heavy mod assist to elevate trunk  Transfers Overall transfer level: Needs assistance Equipment used:  (bed pad) Transfers: Lateral/Scoot Transfers          Lateral/Scoot Transfers: Max assist General transfer comment: Max assist and use of bed pad to slide to drop-arm recliner; Noted pt did initiate weight shift with initial scooting  Ambulation/Gait                Stairs            Wheelchair Mobility     Modified Rankin (Stroke Patients Only)       Balance     Sitting balance-Leahy Scale: Fair                                       Pertinent Vitals/Pain Pain Assessment: No/denies pain    Home Living Family/patient expects to be discharged to:: Skilled nursing facility                      Prior Function Level of Independence: Needs assistance   Gait / Transfers Assistance Needed: Reports basic pivot and some "hopping" for transfers at SNF prior to admission  ADL's / Homemaking Assistance Needed: Pt reports she recieves assistance from SNF staff for bathing and dressing        Hand Dominance   Dominant Hand: Right    Extremity/Trunk Assessment   Upper Extremity Assessment: Generalized weakness           Lower Extremity Assessment: RLE deficits/detail (previous L BKA with good knee extension) RLE Deficits / Details: Noted pt seems hesitant to move RLE; with encouragement and AAROM can get close to full knee extension LLE Deficits / Details: s/p left BKA  Cervical / Trunk Assessment: Normal  Communication   Communication: Other (comment) (coughing a lot, audible movement of secretions)  Cognition Arousal/Alertness: Awake/alert Behavior During Therapy: WFL for tasks assessed/performed Overall Cognitive Status: Impaired/Different from baseline Area of  Impairment: Orientation               General Comments: Had to be reminded of her R BKA yesterday    General Comments      Exercises        Assessment/Plan    PT Assessment Patient needs continued PT services  PT Diagnosis Generalized weakness   PT Problem List Decreased strength;Decreased activity tolerance;Decreased balance;Decreased mobility;Decreased cognition;Decreased skin integrity  PT Treatment Interventions Balance training;Functional mobility training;Therapeutic exercise;Wheelchair mobility training;Therapeutic activities;Patient/family education;Gait training    PT Goals (Current goals can be found in the Care Plan section) Acute Rehab PT Goals Patient Stated Goal: none stated PT Goal Formulation: With patient Time For Goal Achievement: 06/19/15 Potential to Achieve Goals: Fair    Frequency Min 2X/week   Barriers to discharge        Co-evaluation               End of Session   Activity Tolerance: Patient tolerated treatment well Patient left: in chair;with call bell/phone within reach;with family/visitor present Nurse Communication: Mobility status         Time: 2956-2130 PT Time Calculation (min) (ACUTE ONLY): 29 min   Charges:   PT Evaluation $Initial PT Evaluation Tier I: 1 Procedure PT Treatments $Therapeutic Activity: 8-22 mins   PT G Codes:        Olen Pel 06/05/2015, 3:04 PM  Van Clines, PT  Acute Rehabilitation Services Pager [phone] Office [phone]

## 2015-06-06 ENCOUNTER — Encounter (HOSPITAL_COMMUNITY): Payer: Self-pay | Admitting: Orthopedic Surgery

## 2015-06-06 LAB — CBC
HEMATOCRIT: 29.4 % — AB (ref 36.0–46.0)
HEMOGLOBIN: 9.2 g/dL — AB (ref 12.0–15.0)
MCH: 29.6 pg (ref 26.0–34.0)
MCHC: 31.3 g/dL (ref 30.0–36.0)
MCV: 94.5 fL (ref 78.0–100.0)
Platelets: 194 10*3/uL (ref 150–400)
RBC: 3.11 MIL/uL — ABNORMAL LOW (ref 3.87–5.11)
RDW: 14.6 % (ref 11.5–15.5)
WBC: 8.2 10*3/uL (ref 4.0–10.5)

## 2015-06-06 LAB — COMPREHENSIVE METABOLIC PANEL
ALT: 11 U/L — ABNORMAL LOW (ref 14–54)
ANION GAP: 10 (ref 5–15)
AST: 31 U/L (ref 15–41)
Albumin: 2.4 g/dL — ABNORMAL LOW (ref 3.5–5.0)
Alkaline Phosphatase: 53 U/L (ref 38–126)
BILIRUBIN TOTAL: 1 mg/dL (ref 0.3–1.2)
BUN: 11 mg/dL (ref 6–20)
CO2: 27 mmol/L (ref 22–32)
Calcium: 8.1 mg/dL — ABNORMAL LOW (ref 8.9–10.3)
Chloride: 99 mmol/L — ABNORMAL LOW (ref 101–111)
Creatinine, Ser: 0.4 mg/dL — ABNORMAL LOW (ref 0.44–1.00)
Glucose, Bld: 109 mg/dL — ABNORMAL HIGH (ref 65–99)
POTASSIUM: 3.3 mmol/L — AB (ref 3.5–5.1)
Sodium: 136 mmol/L (ref 135–145)
TOTAL PROTEIN: 6.1 g/dL — AB (ref 6.5–8.1)

## 2015-06-06 LAB — MAGNESIUM
Magnesium: 2 mg/dL (ref 1.7–2.4)
Magnesium: 2.1 mg/dL (ref 1.7–2.4)

## 2015-06-06 LAB — TROPONIN I
TROPONIN I: 0.05 ng/mL — AB (ref <0.031)
Troponin I: 0.08 ng/mL — ABNORMAL HIGH (ref <0.031)

## 2015-06-06 LAB — TSH: TSH: 4.26 u[IU]/mL (ref 0.350–4.500)

## 2015-06-06 LAB — T4, FREE: FREE T4: 0.89 ng/dL (ref 0.61–1.12)

## 2015-06-06 MED ORDER — DEXTROSE 5 % IV SOLN
5.0000 mg/h | INTRAVENOUS | Status: AC
  Administered 2015-06-06 – 2015-06-07 (×2): 5 mg/h via INTRAVENOUS
  Filled 2015-06-06 (×2): qty 100

## 2015-06-06 MED ORDER — FUROSEMIDE 10 MG/ML IJ SOLN
40.0000 mg | Freq: Once | INTRAMUSCULAR | Status: AC
  Administered 2015-06-06: 40 mg via INTRAVENOUS
  Filled 2015-06-06: qty 4

## 2015-06-06 NOTE — Care Management Important Message (Signed)
Important Message  Patient Details  Name: Shannon Rice MRN: [id number] Date of Birth: Dec 05, 1930   Medicare Important Message Given:  Yes    Alma Mohiuddin P [place] Kadlec 06/06/2015, 1:10 PM

## 2015-06-06 NOTE — Consult Note (Signed)
CARDIOLOGY CONSULT NOTE  Patient ID: Shannon Rice MRN: [id number] DOB/AGE: 12/09/1930 81 y.o.  Admit date: 06/02/2015 Referring Physician:  Internal Medicine Primary Physician:  Ralene Ok, MD Reason for Consultation:  New onset a.fib  HPI: Shannon Rice  is a 81 y.o. female with a history of PAD secondary to systemic sclerosis and long history of tobacco use.  She underwent PTA and arthrectomy of the left common femoral artery and thrombectomy and balloon angioplasty of the left peroneal artery on 04/12/2015, however, she had unfortunately already developed osteomyelitis and underwent left BKA on 04/20/2015.  She had repeat peripheral angiogram on 05/17/2015 with PTA and atherectomy of the right external iliac artery and right common femoral artery and right distal SFA.  She had a high-grade lesion in the peroneal artery with unsuccessful attempt at angioplasty.    She presented to the hospital on 06/02/2015 via EMS from skilled nursing facility for increasing redness and pain in her right lower extremity.  Due to progressive gangrene of the majority of her right foot, she underwent right BKA on 06/04/2015. On 06/05/2015, she developed atrial fibrillation with rate of 107-130.  We were consulted regarding management of A. fib and anticoagulation.  She was started on Cardizem gtt and has since converted to sinus rhythm. She denies any chest pain, palpitations, or SOB.  Past Medical History  Diagnosis Date  . Hypertension   . GERD (gastroesophageal reflux disease)   . Lupus (HCC)     skin, scleraderma  . Scleroderma (HCC)   . Arthritis     scleroderma  . Peripheral vascular disease (HCC)   . PAD (peripheral artery disease) (HCC)   . Heart murmur   . Chronic diastolic (congestive) heart failure [facility])      Past Surgical History  Procedure Laterality Date  . Abdominal hysterectomy    . Coronary angioplasty      Dr [name]  . Sympathectomy Right 01/27/2015    Procedure:  RIGHT RADICAL ARTERY SYMPATHECTOMY, RIGHT ULNAR ARTERY SYMPATHECTOMY, DIGITAL SYMPATHECTOMY RIGHT INDEX, MIDDLE, RING SMALL;  Surgeon: [name], MD;  Location: [facility];  Service: Orthopedics;  Laterality: Right;  . Amputation Right 01/27/2015    Procedure: AMPUTATION DISTAL TIP RIGHT INDEX FINGER;  Surgeon: [name], MD;  Location: [facility];  Service: Orthopedics;  Laterality: Right;  . Peripheral vascular catheterization N/A 02/15/2015    Procedure: Lower Extremity Angiography;  Surgeon: [name], MD;  Location: [facility] INVASIVE CV LAB;  Service: Cardiovascular;  Laterality: N/A;  . Peripheral vascular catheterization Left 04/12/2015    Procedure: Lower Extremity Angiography;  Surgeon: [name], MD;  Location: [facility] INVASIVE CV LAB;  Service: Cardiovascular;  Laterality: Left;  . Peripheral vascular catheterization Left 04/12/2015    Procedure: Peripheral Vascular Atherectomy;  Surgeon: [name], MD;  Location: [facility] INVASIVE CV LAB;  Service: Cardiovascular;  Laterality: Left;  sfa     . Amputation Left 04/20/2015    Procedure: Left Below Knee Amputation;  Surgeon: Nadara Mustard, MD;  Location: [facility] OR;  Service: Orthopedics;  Laterality: Left;  . Peripheral arterial stent graft Left 05/17/2015  . Peripheral vascular catheterization N/A 05/17/2015    Procedure: Lower Extremity Intervention;  Surgeon: [name], MD;  Location: [facility] INVASIVE CV LAB;  Service: Cardiovascular;  Laterality: N/A;     Family History  Problem Relation Age of Onset  . Diabetes Sister   . Diabetes Brother      Social History: Social History   Social History  .  Marital Status: Widowed    Spouse Name: N/A  . Number of Children: N/A  . Years of Education: N/A   Occupational History  . Not on file.   Social History Main Topics  . Smoking status: Former Smoker -- 0.25 packs/day for 60 years    Types: Cigarettes    Quit date: 12/12/2014  . Smokeless tobacco: Never Used  . Alcohol Use: Yes      Comment: glass wine on special occasions  . Drug Use: No  . Sexual Activity: No   Other Topics Concern  . Not on file   Social History Narrative     Prescriptions prior to admission  Medication Sig Dispense Refill Last Dose  . acetaminophen (TYLENOL) 325 MG tablet Take 2 tablets (650 mg total) by mouth every 6 (six) hours as needed for mild pain (or Fever >/= 101).   unk  . amLODipine-benazepril (LOTREL) 5-20 MG per capsule Take 1 capsule by mouth daily.   unk  . aspirin EC 81 MG EC tablet Take 1 tablet (81 mg total) by mouth daily. 30 tablet 0 unk  . clopidogrel (PLAVIX) 75 MG tablet Take 1 tablet (75 mg total) by mouth daily.   unk  . feeding supplement, ENSURE ENLIVE, (ENSURE ENLIVE) LIQD Take 237 mLs by mouth 2 (two) times daily between meals. 237 mL 12 unk  . morphine (MS CONTIN) 15 MG 12 hr tablet Take 1 tablet (15 mg total) by mouth every 12 (twelve) hours. 60 tablet 0 unk  . omeprazole (PRILOSEC) 20 MG capsule Take 1 capsule (20 mg total) by mouth daily. 30 capsule 2 unk  . ondansetron (ZOFRAN ODT) 4 MG disintegrating tablet Take 1 tablet (4 mg total) by mouth every 8 (eight) hours as needed for nausea or vomiting. 20 tablet 0 unk  . oxyCODONE (ROXICODONE) 5 MG immediate release tablet Take 1-2 tablets (5-10 mg total) by mouth every 4 (four) hours as needed for severe pain. 180 tablet 0 unk  . Pitavastatin Calcium (LIVALO) 2 MG TABS Take 2 mg by mouth 2 (two) times daily.   unk  . polyethylene glycol (MIRALAX / GLYCOLAX) packet Take 17 g by mouth daily as needed for moderate constipation. 14 each 0 unk  . pregabalin (LYRICA) 50 MG capsule Take 1 capsule (50 mg total) by mouth 3 (three) times daily.   unk     ROS: General: reports fatigue; no fevers/chills/night sweats Eyes: no blurry vision, diplopia, or amaurosis ENT: no sore throat or hearing loss Resp: no cough, wheezing, or hemoptysis CV: no edema or palpitations GI: no abdominal pain, nausea, vomiting, diarrhea, or  constipation GU: no dysuria, frequency, or hematuria Skin: no rash Neuro: no headache, numbness, tingling, or weakness of extremities Musculoskeletal: no joint pain or swelling Heme: no bleeding, DVT, or easy bruising Endo: no polydipsia or polyuria    Physical Exam: Blood pressure 127/47, pulse 102, temperature 97.4 F (36.3 C), temperature source Oral, resp. rate 19, height 5\' 3"  (1.6 m), weight 37.8 kg (83 lb 5.3 oz), SpO2 100 %.   General appearance: alert, cooperative, cachectic, fatigued and no distress Neck: bilateral soft carotid bruit Lungs: clear to auscultation bilaterally Heart: regular rate and rhythm, S1, S2 normal, no murmur, click, rub or gallop Extremities: bilateral BKA Pulses: Femoral pulses 2+ bilaterally  Labs:   Lab Results  Component Value Date   WBC 8.2 06/06/2015   HGB 9.2* 06/06/2015   HCT 29.4* 06/06/2015   MCV 94.5 06/06/2015   PLT 194  06/06/2015    Recent Labs Lab 06/06/15 0912  NA 136  K 3.3*  CL 99*  CO2 27  BUN 11  CREATININE 0.40*  CALCIUM 8.1*  PROT 6.1*  BILITOT 1.0  ALKPHOS 53  ALT 11*  AST 31  GLUCOSE 109*    Lipid Panel     Component Value Date/Time   CHOL 78 05/25/2015 0616   TRIG 47 05/25/2015 0616   HDL 26* 05/25/2015 0616   CHOLHDL 3.0 05/25/2015 0616   VLDL 9 05/25/2015 0616   LDLCALC 43 05/25/2015 0616    BNP (last 3 results)  Recent Labs  05/25/15 0616  BNP 840.0*    TSH  Recent Labs  05/25/15 0616 06/06/15 0226  TSH 5.185* 4.260    EKG 06/05/2015 at 2023: afib with RVR at a rate of 111 bpm, no ischemic changes  EKG 06/06/2015 at 1054: sinus rhythm at a rate of 92bpm, biatrial enlargement, no evidence of ischemia  Echo 04/16/2015: Left ventricle: The cavity size was normal. There was moderate concentric hypertrophy. Systolic function was normal. Theestimated ejection fraction was in the range of 60% to 65%.Features are consistent with a pseudonormal left ventricularfilling pattern, with  concomitant abnormal relaxation andincreased filling pressure (grade 2 diastolic dysfunction).Doppler parameters are consistent with both elevated ventricularend-diastolic filling pressure and elevated left atrial fillingpressure. Aortic valve: There was trivial regurgitation. Mitral valve: Calcified annulus. There was mild regurgitation. Left atrium: The atrium was moderately dilated. Right atrium: The atrium was mildly dilated. Tricuspid valve: There was moderate regurgitation. Pulmonary arteries: PA peak pressure: 59 mm Hg (S). Pericardium, extracardiac: There was a left pleural effusion.   Radiology: Dg Chest Port 1 View  06/05/2015  CLINICAL DATA:  Fever EXAM: PORTABLE CHEST 1 VIEW COMPARISON:  06/04/2015 FINDINGS: Cardiomegaly again noted. No pulmonary edema. Persistent streaky left perihilar atelectasis or infiltrate. New streaky atelectasis or infiltrate in right upper lobe. IMPRESSION: Persistent left perihilar atelectasis or infiltrate. New streaky atelectasis or infiltrate in right upper lobe. Cardiomegaly again noted. Electronically Signed   By: Natasha Mead M.D.   On: 06/05/2015 09:31    Scheduled Meds: . antiseptic oral rinse  7 mL Mouth Rinse BID  . aspirin EC  81 mg Oral Daily  . benazepril  10 mg Oral Daily  . clopidogrel  75 mg Oral Daily  . docusate sodium  100 mg Oral BID  . enoxaparin (LOVENOX) injection  30 mg Subcutaneous Q24H  . guaiFENesin  600 mg Oral BID  . lactose free nutrition  237 mL Oral TID WC  . morphine  15 mg Oral Q12H  . multivitamin with minerals  1 tablet Oral Daily  . pantoprazole  40 mg Oral Daily  . piperacillin-tazobactam (ZOSYN)  IV  3.375 g Intravenous Q8H  . pravastatin  80 mg Oral q1800  . pregabalin  50 mg Oral TID  . vancomycin  1,000 mg Intravenous Q24H   Continuous Infusions: . sodium chloride Stopped (06/06/15 0300)  . sodium chloride    . diltiazem (CARDIZEM) infusion 5 mg/hr (06/06/15 1007)  . lactated ringers Stopped  (06/04/15 1237)   PRN Meds:.acetaminophen **OR** acetaminophen, alum & mag hydroxide-simeth, levalbuterol, methocarbamol **OR** methocarbamol (ROBAXIN)  IV, metoCLOPramide **OR** metoCLOPramide (REGLAN) injection, ondansetron **OR** ondansetron (ZOFRAN) IV, ondansetron, oxyCODONE, polyethylene glycol  ASSESSMENT AND PLAN:  1. New onset atrial fibrillation (CHA2DS2-VASCScore: Risk Score 5,  Yearly risk of stroke 6.7%.  OAC Has Bled: Score 2.  Estimated risk of major bleeding at 1 year with OAC 1.88-3.2%)  2. PAD with critical lower limb ischemia s/p left BKA on 04/20/15 and right BKA on 06/04/15 3. Systemic sclerosis 4. Benign essential hypertension 5. Hyperlipidemia  Recommendation: Paroxysmal atrial fibrillation, likely due to postoperative state and postoperative aspiration pneumonia.  Presently in sinus rhythm, although high likelihood of recurrence of a.fib.  Would recommend anticoagulation with Eliquis 2.5 mg BID if Hb stable by tomorrow.  Erling Conte, NP-C 06/06/2015, 12:17 PM [facility] Pager: [phone] Office: [phone]  I have personally reviewed the patient's record and performed physical exam and agree with the assessment and plan of Ms. [name], NP-C.  [name], MD 06/07/2015, 6:55 AM [facility] Pager: [phone] Office: [phone] If no answer: Cell:  [phone]

## 2015-06-06 NOTE — Progress Notes (Signed)
[facility] PROGRESS NOTE  Shannon SkeetersJohnetta M Rice GNF:[id number] DOB: 03-May-1931 DOA: 06/02/2015 PCP: Ralene OkMOREIRA,ROY, MD  Length of stay: 4   Assessment/Plan: Principal Problem:   Cellulitis of right foot Active Problems:   Scleroderma (HCC)   Peripheral artery disease (HCC)   Critical lower limb ischemia   HTN (hypertension)   Protein-calorie malnutrition, severe (HCC)   HLD (hyperlipidemia)   Chronic diastolic (congestive) heart failure (HCC)   Gangrene of foot (HCC)   Pressure ulcer     Brief summary 81 y.o. female, with lupus, scleroderma, diastolic heart failure and severe peripheral artery disease status post left AKA and has had multiple peripheral vascular catheterization catheterizations for critical limb ischemia (05/17/2015). [facility] for increasing redness and pain in the leg above her right lower extremity gangrenous toes. The patient is mildly, but pleasantly confused and is not a good historian. Her daughter explains that [facility] due to [facility]. Found to have Acute right lower extremity cellulitis in the setting of known gangrene in the distal digits. Status post transtibial amputation, postoperatively developed pneumonia and atrial fibrillation with RVR.   Assessment and plan  Acute right lower extremity cellulitis in the setting of known gangrene in the distal digits Continue IV vancomycin/Zosyn.  Dr. Lajoyce Cornersuda performed Transtibial amputation on the right on 11/26 Cont IV antibiotics for now in the setting of aspiration pneumonia Previously, she is status post Successful atherectomy and drug coated balloon angioplasty of the right external iliac and right common femoral and right superficial femoral artery during her last admission Hemoglobin 9.0 down from 11.6, will transfuse for hemoglobin less than 8  New-onset atrial fibrillation Likely from acute blood loss anemia, pneumonia, stress  of surgery TSH 4.26 We'll cycle cardiac enzymes Repeat EKG Patient started on Cardizem drip, Dr. Jacinto HalimGanji  from cardiology consulted, patient is on aspirin and Plavix not sure  if she needs to be anticoagulated in the setting of transient atrial fibrillation Lasix 40 mg IV 1 Most recent 2-D echo shows EF of 60-65% with grade 2 diastolic dysfunction   Acute on chronic diastolic heart failure, EF 60-65%, Lasix 40 mg IV 1  Probable aspiration during surgery  Dr Jean RosenthalJackson concerned about aspiration,cont abx, repeat chest x-ray shows new infiltrate She was suctioned aggressively during surgery , continue zosyn to vanc  Now on Modified diet with aspiration precautions Off Ventimask, now requiring 4 L of oxygen by nasal cannula Continue intermittent diuresis with Lasix Hemodynamically stable   Severe peripheral artery disease. On aspirin, Plavix. Will continue these.  Status post Successful atherectomy and drug coated balloon angioplasty of the right external iliac and right  common femoral and right superficial femoral artery on 11/8     Chronic pain Continue MS Contin, oxycodone, Lyrica.   Dyslipidemia Continue Livalo.  Gastroesophageal reflux disease-Protonix  Scleroderma-continue Lyrica    DVT prophylaxsis Lovenox  Code Status:      Code Status Orders        Start     Ordered   06/02/15 0940  Do not attempt resuscitation (DNR)   Continuous    Question Answer Comment  In the event of cardiac or respiratory ARREST Do not call a "code blue"   In the event of cardiac or respiratory ARREST Do not perform Intubation, CPR, defibrillation or ACLS   In the event of cardiac or respiratory ARREST Use medication by any route, position, wound care, and other  measures to relive pain and suffering. May use oxygen, suction and manual treatment of airway obstruction as needed for comfort.      06/02/15 1308    Advance Directive Documentation        Most Recent Value   Type of  Advance Directive  Out of facility DNR (pink MOST or yellow form)   Pre-existing out of facility DNR order (yellow form or pink MOST form)  Yellow form placed in chart (order not valid for inpatient use)   "MOST" Form in Place?       Family Communication: family updated about patient's clinical progress Disposition Plan: Continue stepdown     Consultants:  Dr. Lajoyce Corners  Procedures:  None  Antibiotics: Anti-infectives    Start     Dose/Rate Route Frequency Ordered Stop   06/05/15 0640  vancomycin (VANCOCIN) IVPB 1000 mg/200 mL premix     1,000 mg 200 mL/hr over 60 Minutes Intravenous Every 24 hours 06/05/15 0641     06/04/15 1200  piperacillin-tazobactam (ZOSYN) IVPB 3.375 g     3.375 g 12.5 mL/hr over 240 Minutes Intravenous Every 8 hours 06/04/15 1055     06/03/15 0600  vancomycin (VANCOCIN) 500 mg in sodium chloride 0.9 % 100 mL IVPB  Status:  Discontinued     500 mg 100 mL/hr over 60 Minutes Intravenous Every 24 hours 06/02/15 1908 06/05/15 0641   06/02/15 0945  vancomycin (VANCOCIN) IVPB 1000 mg/200 mL premix  Status:  Discontinued     1,000 mg 200 mL/hr over 60 Minutes Intravenous  Once 06/02/15 0939 06/02/15 0956   06/02/15 0615  vancomycin (VANCOCIN) 500 mg in sodium chloride 0.9 % 100 mL IVPB     500 mg 100 mL/hr over 60 Minutes Intravenous  Once 06/02/15 0614 06/02/15 0808         HPI/Subjective: A fib last night ,   Objective: Filed Vitals:   06/05/15 2318 06/06/15 0033 06/06/15 0300 06/06/15 0742  BP: 121/52  115/49 149/54  Pulse: 102     Temp: 100.7 F (38.2 C)  97.8 F (36.6 C) 97.8 F (36.6 C)  TempSrc: Oral  Oral Oral  Resp: 25 24 14 25   Height:      Weight:   37.8 kg (83 lb 5.3 oz)   SpO2: 100% 100% 100% 100%    Intake/Output Summary (Last 24 hours) at 06/06/15 0946 Last data filed at 06/06/15 0800  Gross per 24 hour  Intake 1244.33 ml  Output    375 ml  Net 869.33 ml    Exam:  General: No acute respiratory distress Lungs: Clear to  auscultation bilaterally without wheezes or crackles Cardiovascular: Regular rate and rhythm without murmur gallop or rub normal S1 and S2 Abdomen: Nontender, nondistended, soft, bowel sounds positive, no rebound, no ascites, no appreciable mass Extremities: Able to move all 4. 5/5 strength in each, no effusions. Left AKA. Right lower extremity has mild erythema in the tibial area, toes are black with gangrene. Dried white pus is surrounding her toes   Data Review   Micro Results Recent Results (from the past 240 hour(s))  MRSA PCR Screening     Status: None   Collection Time: 06/02/15 12:00 PM  Result Value Ref Range Status   MRSA by PCR NEGATIVE NEGATIVE Final    Comment:        The GeneXpert MRSA Assay (FDA approved for NASAL specimens only), is one component of a comprehensive MRSA colonization surveillance program. It is not intended  to diagnose MRSA infection nor to guide or monitor treatment for MRSA infections.   Surgical PCR screen     Status: Abnormal   Collection Time: 06/03/15  6:12 PM  Result Value Ref Range Status   MRSA, PCR NEGATIVE NEGATIVE Final   Staphylococcus aureus POSITIVE (A) NEGATIVE Final    Comment:        The Xpert SA Assay (FDA approved for NASAL specimens in patients over 55 years of age), is one component of a comprehensive surveillance program.  Test performance has been validated by [facility] for patients greater than or equal to 64 year old. It is not intended to diagnose infection nor to guide or monitor treatment.   Urine culture     Status: None   Collection Time: 06/03/15  8:22 PM  Result Value Ref Range Status   Specimen Description URINE, RANDOM  Final   Special Requests NONE  Final   Culture 40,000 COLONIES/ml ESCHERICHIA COLI  Final   Report Status 06/05/2015 FINAL  Final   Organism ID, Bacteria ESCHERICHIA COLI  Final      Susceptibility   Escherichia coli - MIC*    AMPICILLIN <=2 SENSITIVE Sensitive     CEFAZOLIN  <=4 SENSITIVE Sensitive     CEFTRIAXONE <=1 SENSITIVE Sensitive     CIPROFLOXACIN <=0.25 SENSITIVE Sensitive     GENTAMICIN <=1 SENSITIVE Sensitive     IMIPENEM <=0.25 SENSITIVE Sensitive     NITROFURANTOIN <=16 SENSITIVE Sensitive     TRIMETH/SULFA <=20 SENSITIVE Sensitive     AMPICILLIN/SULBACTAM <=2 SENSITIVE Sensitive     PIP/TAZO <=4 SENSITIVE Sensitive     * 40,000 COLONIES/ml ESCHERICHIA COLI    Radiology Reports Ct Abdomen Pelvis Wo Contrast  05/25/2015  CLINICAL DATA:  Acute onset of dizziness. Anemia. Assess for retroperitoneal bleed. Initial encounter. EXAM: CT ABDOMEN AND PELVIS WITHOUT CONTRAST TECHNIQUE: Multidetector CT imaging of the abdomen and pelvis was performed following the standard protocol without IV contrast. COMPARISON:  CT of the abdomen and pelvis performed 08/24/2010 FINDINGS: There is no evidence of retroperitoneal hemorrhage. Mild diffuse soft tissue edema is noted about the abdomen and pelvis, likely reflecting mild anasarca. Trace bilateral pleural fluid is noted, with bibasilar atelectasis. Mild biatrial enlargement is noted. Diffuse coronary artery calcifications are seen. The liver and spleen are unremarkable in appearance. The gallbladder is within normal limits. The pancreas and adrenal glands are unremarkable. A few small hyperdense foci within the kidneys may reflect small hyperdense cysts. There is no evidence of hydronephrosis. A 2 mm stone is noted at the upper pole of the right kidney. No obstructing ureteral stones are seen. No perinephric stranding is appreciated. No free fluid is identified. The small bowel is unremarkable in appearance. The stomach is within normal limits. No acute vascular abnormalities are seen. Diffuse calcification is noted along the abdominal aorta and its branches. The appendix is normal in caliber, without evidence of appendicitis. The colon is unremarkable in appearance. The bladder is significantly distended and grossly  unremarkable. The patient is status post hysterectomy. No suspicious adnexal masses are seen. No inguinal lymphadenopathy is seen. No acute osseous abnormalities are identified. A chronic right-sided pars defect is noted at L5. IMPRESSION: 1. No evidence of acute hemorrhage. 2. Mild diffuse soft tissue edema about the abdomen and pelvis, likely reflecting mild anasarca. 3. Trace bilateral pleural fluid, with bibasilar atelectasis. 4. Mild biatrial enlargement noted. 5. Diffuse coronary artery calcifications seen. 6. Diffuse calcification along the abdominal aorta and branches. 7.  Few small hyperdense foci within the kidneys may reflect small hyperdense cysts. 2 mm nonobstructing stone at the upper pole of the right kidney. 8. Chronic right-sided pars defect at L5. Electronically Signed   By: Roanna Raider M.D.   On: 05/25/2015 02:55   Dg Chest Port 1 View  06/05/2015  CLINICAL DATA:  Fever EXAM: PORTABLE CHEST 1 VIEW COMPARISON:  06/04/2015 FINDINGS: Cardiomegaly again noted. No pulmonary edema. Persistent streaky left perihilar atelectasis or infiltrate. New streaky atelectasis or infiltrate in right upper lobe. IMPRESSION: Persistent left perihilar atelectasis or infiltrate. New streaky atelectasis or infiltrate in right upper lobe. Cardiomegaly again noted. Electronically Signed   By: Natasha Mead M.D.   On: 06/05/2015 09:31   Dg Chest Port 1 View  06/04/2015  CLINICAL DATA:  Postop, intubated, possible aspiration EXAM: PORTABLE CHEST 1 VIEW COMPARISON:  04/17/2015 FINDINGS: Cardiomediastinal silhouette is stable. Mild hyperinflation. Stable left basilar atelectasis or scarring. There is left perihilar atelectasis or infiltrate. No pulmonary edema pre IMPRESSION: Mild hyperinflation. Cardiomegaly again noted. Left perihilar atelectasis or infiltrate. No pulmonary edema. Electronically Signed   By: Natasha Mead M.D.   On: 06/04/2015 11:03   Dg Foot Complete Right  06/02/2015  CLINICAL DATA:  Gangrene of  the foot. EXAM: RIGHT FOOT COMPLETE - 3+ VIEW COMPARISON:  None. FINDINGS: There is no fracture or dislocation. No bone destruction to suggest osteomyelitis. Soft tissue swelling of the dorsum of the foot. Arterial vascular calcifications. IMPRESSION: No acute osseous abnormality.  Osteopenia. Electronically Signed   By: Francene Boyers M.D.   On: 06/02/2015 08:55     CBC  Recent Labs Lab 06/02/15 0620 06/03/15 0406 06/04/15 0610 06/05/15 0523  WBC 4.8 4.5 7.8 8.8  HGB 10.0* 9.4* 11.6* 9.0*  HCT 33.1* 31.4* 37.8 29.1*  PLT 152 167 182 185  MCV 95.9 96.3 96.9 95.1  MCH 29.0 28.8 29.7 29.4  MCHC 30.2 29.9* 30.7 30.9  RDW 14.7 14.8 14.6 14.7  LYMPHSABS 0.9  --   --   --   MONOABS 0.3  --   --   --   EOSABS 0.1  --   --   --   BASOSABS 0.0  --   --   --     Chemistries   Recent Labs Lab 06/02/15 0620 06/03/15 0406 06/04/15 0610 06/05/15 0523 06/06/15 0226  NA 136 138 135 133*  --   K 4.1 3.7 3.9 3.6  --   CL 100* 103 101 96*  --   CO2 28 29 26 26   --   GLUCOSE 87 123* 104* 93  --   BUN 6 10 7 14   --   CREATININE 0.43* 0.48 0.46 0.50  --   CALCIUM 8.8* 8.5* 8.8* 8.1*  --   MG  --   --   --   --  2.0  AST 23  --  23 26  --   ALT 13*  --  12* 10*  --   ALKPHOS 57  --  66 48  --   BILITOT 0.6  --  0.5 0.4  --    ------------------------------------------------------------------------------------------------------------------ estimated creatinine clearance is 32.4 mL/min (by C-G formula based on Cr of 0.5). ------------------------------------------------------------------------------------------------------------------ No results for input(s): HGBA1C in the last 72 hours. ------------------------------------------------------------------------------------------------------------------ No results for input(s): CHOL, HDL, LDLCALC, TRIG, CHOLHDL, LDLDIRECT in the last 72  hours. ------------------------------------------------------------------------------------------------------------------  Recent Labs  06/06/15 0226  TSH 4.260   ------------------------------------------------------------------------------------------------------------------ No results for input(s): VITAMINB12, FOLATE,  FERRITIN, TIBC, IRON, RETICCTPCT in the last 72 hours.  Coagulation profile No results for input(s): INR, PROTIME in the last 168 hours.  No results for input(s): DDIMER in the last 72 hours.  Cardiac Enzymes No results for input(s): CKMB, TROPONINI, MYOGLOBIN in the last 168 hours.  Invalid input(s): CK ------------------------------------------------------------------------------------------------------------------ Invalid input(s): POCBNP   CBG: No results for input(s): GLUCAP in the last 168 hours.     Studies: Dg Chest Port 1 View  06/05/2015  CLINICAL DATA:  Fever EXAM: PORTABLE CHEST 1 VIEW COMPARISON:  06/04/2015 FINDINGS: Cardiomegaly again noted. No pulmonary edema. Persistent streaky left perihilar atelectasis or infiltrate. New streaky atelectasis or infiltrate in right upper lobe. IMPRESSION: Persistent left perihilar atelectasis or infiltrate. New streaky atelectasis or infiltrate in right upper lobe. Cardiomegaly again noted. Electronically Signed   By: Natasha Mead M.D.   On: 06/05/2015 09:31   Dg Chest Port 1 View  06/04/2015  CLINICAL DATA:  Postop, intubated, possible aspiration EXAM: PORTABLE CHEST 1 VIEW COMPARISON:  04/17/2015 FINDINGS: Cardiomediastinal silhouette is stable. Mild hyperinflation. Stable left basilar atelectasis or scarring. There is left perihilar atelectasis or infiltrate. No pulmonary edema pre IMPRESSION: Mild hyperinflation. Cardiomegaly again noted. Left perihilar atelectasis or infiltrate. No pulmonary edema. Electronically Signed   By: Natasha Mead M.D.   On: 06/04/2015 11:03      No results found for: HGBA1C Lab  Results  Component Value Date   LDLCALC 43 05/25/2015   CREATININE 0.50 06/05/2015       Scheduled Meds: . antiseptic oral rinse  7 mL Mouth Rinse BID  . aspirin EC  81 mg Oral Daily  . benazepril  10 mg Oral Daily  . clopidogrel  75 mg Oral Daily  . docusate sodium  100 mg Oral BID  . enoxaparin (LOVENOX) injection  30 mg Subcutaneous Q24H  . furosemide  40 mg Intravenous Once  . guaiFENesin  600 mg Oral BID  . lactose free nutrition  237 mL Oral TID WC  . morphine  15 mg Oral Q12H  . multivitamin with minerals  1 tablet Oral Daily  . pantoprazole  40 mg Oral Daily  . piperacillin-tazobactam (ZOSYN)  IV  3.375 g Intravenous Q8H  . pravastatin  80 mg Oral q1800  . pregabalin  50 mg Oral TID  . vancomycin  1,000 mg Intravenous Q24H   Continuous Infusions: . sodium chloride Stopped (06/06/15 0300)  . sodium chloride    . diltiazem (CARDIZEM) infusion    . lactated ringers Stopped (06/04/15 1237)    Principal Problem:   Cellulitis of right foot Active Problems:   Scleroderma (HCC)   Peripheral artery disease (HCC)   Critical lower limb ischemia   HTN (hypertension)   Protein-calorie malnutrition, severe (HCC)   HLD (hyperlipidemia)   Chronic diastolic (congestive) heart failure (HCC)   Gangrene of foot (HCC)   Pressure ulcer    Time spent: 45 minutes   [facility]  [facility] Pager [phone]. If 7PM-7AM, please contact night-coverage at [email or web address], password [facility] 06/06/2015, 9:46 AM  LOS: 4 days

## 2015-06-06 NOTE — Clinical Social Work Placement (Signed)
   CLINICAL SOCIAL WORK PLACEMENT  NOTE  Date:  06/06/2015  Patient Details  Name: Shannon Rice MRN: [id number] Date of Birth: [birth year]  Clinical Social Work is seeking post-discharge placement for this patient at the Skilled  Nursing Facility level of care (*CSW will initial, date and re-position this form in  chart as items are completed):  Yes   Patient/family provided with [place] Clinical Social Work Department's list of facilities offering this level of care within the geographic area requested by the patient (or if unable, by the patient's family).  Yes   Patient/family informed of their freedom to choose among providers that offer the needed level of care, that participate in Medicare, Medicaid or managed care program needed by the patient, have an available bed and are willing to accept the patient.  Yes   Patient/family informed of [facility]'s ownership interest in [facility] and [facility], as well as of the fact that they are under no obligation to receive care at these facilities.  PASRR submitted to EDS on       PASRR number received on       Existing PASRR number confirmed on 06/06/15     FL2 transmitted to all facilities in geographic area requested by pt/family on 06/06/15     FL2 transmitted to all facilities within larger geographic area on       Patient informed that his/her managed care company has contracts with or will negotiate with certain facilities, including the following:            Patient/family informed of bed offers received.  Patient chooses bed at       Physician recommends and patient chooses bed at      Patient to be transferred to   on  .  Patient to be transferred to facility by       Patient family notified on   of transfer.  Name of family member notified:        PHYSICIAN Please sign FL2, Please prepare prescriptions, Please sign DNR     Additional Comment:     _______________________________________________  Rayvon CharMaggie Marsi Turvey BSW Intern, [phone]

## 2015-06-06 NOTE — Evaluation (Signed)
Occupational Therapy Evaluation Patient Details Name: Shannon Rice MRN: [id number] DOB: 06/11/1931 Today's Date: 06/06/2015    History of Present Illness Shannon Rice is a 81 y.o. female, with lupus, scleroderma, diastolic heart failure and severe peripheral artery disease status post left AKA and has had multiple peripheral vascular catheterizations for critical limb ischemia (05/17/2015). She presents from [facility] skilled nursing for increasing redness and pain in the leg above her right lower extremity gangrenous toes. s/p R BKA on 11/26, likely aspiration during surgery   Clinical Impression   Pt receives assistance with bathing, dressing and toileting at baseline.  She presents with generalized weakness, impaired cognition and poor balance. She requires moderate to maximum assistance for mobility. Pt will need SNF level rehab upon discharge.  Will defer further OT to SNF.   Follow Up Recommendations  SNF    Equipment Recommendations       Recommendations for Other Services       Precautions / Restrictions Precautions Precautions: Fall Precaution Comments: previous L BKA, now with R BKA Restrictions Weight Bearing Restrictions: No      Mobility Bed Mobility Overal bed mobility: Needs Assistance Bed Mobility: Rolling;Sidelying to Sit;Sit to Sidelying Rolling: Mod assist Sidelying to sit: Mod assist     Sit to sidelying: Mod assist General bed mobility comments: used pad to assist with rolling and to assist hips to EOB, assist to elevate trunk  Transfers                 General transfer comment: pt declined OOB to chair stating she was too cold    Balance Overall balance assessment: Needs assistance   Sitting balance-Leahy Scale: Poor Sitting balance - Comments: balance requires at least one hand on rail/bed                                    ADL Overall ADL's : Needs assistance/impaired Eating/Feeding: Set up;Sitting   Grooming: Wash/dry hands;Wash/dry face;Min guard;Sitting   Upper Body Bathing: Moderate assistance;Sitting   Lower Body Bathing: Total assistance;Bed level   Upper Body Dressing : Moderate assistance;Sitting   Lower Body Dressing: Total assistance;Bed level                 General ADL Comments: Pt with poor sitting balance interfering with ablility to effectively use UEs when seated EOB.     Vision     Perception     Praxis      Pertinent Vitals/Pain Pain Assessment: No/denies pain     Hand Dominance Right   Extremity/Trunk Assessment Upper Extremity Assessment Upper Extremity Assessment: Generalized weakness       Cervical / Trunk Assessment Cervical / Trunk Assessment: Normal   Communication Communication Communication: Other (comment) (hoarse, low volume)   Cognition Arousal/Alertness: Awake/alert Behavior During Therapy: WFL for tasks assessed/performed Overall Cognitive Status: Impaired/Different from baseline Area of Impairment: Orientation;Memory Orientation Level: Disoriented to;Time;Situation   Memory: Decreased short-term memory         General Comments: Had to be reminded of her R BKA yesterday, did not recall having been at SNF prior to this admission.   General Comments       Exercises       Shoulder Instructions      Home Living Family/patient expects to be discharged to:: Skilled nursing facility  Prior Functioning/Environment Level of Independence: Needs assistance  Gait / Transfers Assistance Needed: Reports basic pivot and some "hopping" for transfers at SNF prior to admission ADL's / Homemaking Assistance Needed: Assisted for bathing, dressing, toileting at SNF, able to self feed and groom with set up.        OT Diagnosis: Generalized weakness;Cognitive deficits   OT Problem List: Decreased strength;Decreased activity tolerance;Impaired balance (sitting and/or  standing);Decreased cognition;Decreased knowledge of use of DME or AE;Pain   OT Treatment/Interventions:      OT Goals(Current goals can be found in the care plan section) Acute Rehab OT Goals Patient Stated Goal: none stated  OT Frequency:     Barriers to D/C:            Co-evaluation              End of Session    Activity Tolerance: Patient limited by fatigue Patient left: in bed;with call bell/phone within reach   Time: 1330-1347 OT Time Calculation (min): 17 min Charges:  OT General Charges $OT Visit: 1 Procedure OT Evaluation $Initial OT Evaluation Tier I: 1 Procedure G-Codes:    Evern BioMayberry, Zaidyn Claire Lynn 06/06/2015, 1:48 PM  [phone]

## 2015-06-06 NOTE — Clinical Social Work Note (Signed)
Clinical Social Work Assessment  Patient Details  Name: Shannon Rice MRN: [id number] Date of Birth: February 24, 1931  Date of referral:  06/06/15               Reason for consult:  Facility Placement                Permission sought to share information with:  Facility Sport and exercise psychologist, Family Supports Permission granted to share information::  Yes, Verbal Permission Granted  Name::     Shannon Rice  Agency::  SNF  Relationship::  Daughter  Contact Information:  [phone]  Housing/Transportation Living arrangements for the past 2 months:  [place], [place] of Information:  Adult Children Patient Interpreter Needed:  None Criminal Activity/Legal Involvement Pertinent to Current Situation/Hospitalization:  No - Comment as needed Significant Relationships:  Adult Children Lives with:  Self, Facility Resident Do you feel safe going back to the place where you live?  Yes Need for family participation in patient care:  Yes (Comment)  Care giving concerns:  Patient lived at home alone prior to hospitalizations and SNF placement. Patient daughter stated that once patient is ready to come home, patient daughter and husband will move into patient home to care for the patient. Patient has been recommended to SNF at discharge for rehab.    Social Worker assessment / plan:  BSW intern met with patient and patient daughter at bedside to complete assessment. Patient was unable to complete assessment, so BSW intern completed assessment with patient daughter. Patient daughter stated that patient had been at [facility] for short-term rehab and patient was to complete and discharge from rehab last Thursday. Patient daughter stated that she hoped that the patient could return to [facility] at discharge. BSW intern will continue to follow and assist as needed.  Employment status:  Retired Nurse, adult PT Recommendations:  [place] / Referral to community resources:  [place]  Patient/Family's Response to care:  Patient daughter is happy with the care that the patient is receiving at the hospital. Patient daughter stated that nurses at [facility] were sometimes slow to respond at night, but aside from that, the care was good.  Patient/Family's Understanding of and Emotional Response to Diagnosis, Current Treatment, and Prognosis:  Patient daughter seems to understand patient's current treatment and discharge needs. Patient daughter seems to be very involved with the care for the mother and is optimistic of the care for the patient. Patient daughter was pleasant and easy to work with.   Emotional Assessment Appearance:  Appears stated age Attitude/Demeanor/Rapport:  Lethargic Affect (typically observed):  Quiet, Restless Orientation:  Oriented to Self, Oriented to Place, Oriented to  Time, Oriented to Situation Alcohol / Substance use:  Not Applicable Psych involvement (Current and /or in the community):  No (Comment)  Discharge Needs  Concerns to be addressed:  No discharge needs identified Readmission within the last 30 days:  Yes Current discharge risk:  None Barriers to Discharge:  Continued Medical Work up   [employer], [id number]

## 2015-06-06 NOTE — NC FL2 (Signed)
[place] MEDICAID FL2 LEVEL OF CARE SCREENING TOOL     IDENTIFICATION  Patient Name: Shannon Rice Birthdate: 1930-08-23 Sex: female Admission Date (Current Location): 06/02/2015  [facility] and [place] Number: Producer, television/film/video and Address:  [facility] [place], [address] [place], [place] [address]      Provider Number: [id number]  Attending Physician Name and Address:  Richarda Overlie, MD  Relative Name and Phone Number:  Lynden Ang [phone])    Current Level of Care: Hospital Recommended Level of Care: Skilled Nursing Facility Prior Approval Number:    Date Approved/Denied:   PASRR Number:   [id number]  Discharge Plan: SNF    Current Diagnoses: Patient Active Problem List   Diagnosis Date Noted  . Gangrene of foot (HCC) 06/02/2015  . Cellulitis of right foot 06/02/2015  . Pressure ulcer 06/02/2015  . Symptomatic anemia 05/24/2015  . HLD (hyperlipidemia) 05/24/2015  . GERD (gastroesophageal reflux disease) 05/24/2015  . Arthritis 05/24/2015  . Chronic diastolic (congestive) heart failure (HCC)   . Protein-calorie malnutrition, severe (HCC) 04/19/2015  . Osteomyelitis (HCC) 04/14/2015  . HCAP (healthcare-associated pneumonia) 04/13/2015  . Herpes zoster keratoconjunctivitis 04/13/2015  . HTN (hypertension) 04/13/2015  . Constipation 04/13/2015  . Hypokalemia 04/13/2015  . Hypoxia   . Critical lower limb ischemia 04/10/2015  . Peripheral artery disease (HCC) 02/10/2015  . Finger, open wounds, complicated   . Scleroderma (HCC)     Orientation ACTIVITIES/SOCIAL BLADDER RESPIRATION    Self, Place  Active Incontinent O2 (As needed) (4L)  BEHAVIORAL SYMPTOMS/MOOD NEUROLOGICAL BOWEL NUTRITION STATUS      Continent Diet (Patient currently NPO at this time, will update at a later time)  PHYSICIAN VISITS COMMUNICATION OF NEEDS Height & Weight Skin    Verbally  (160 cm) 83 lbs. PU Stage and Appropriate Care, Surgical wounds  (Leg Right/ Open Wound Leg Left)   PU Stage 2 Dressing:  (Sacrum, every 5 days)      AMBULATORY STATUS RESPIRATION    Assist independent O2 (As needed) (4L)      Personal Care Assistance Level of Assistance  Dressing, Bathing, Feeding Bathing Assistance: Limited assistance Feeding assistance: Independent Dressing Assistance: Limited assistance      Functional Limitations Info                SPECIAL CARE FACTORS FREQUENCY  PT (By licensed PT), OT (By licensed OT)     PT Frequency: 5x/week OT Frequency: 5x/week           Additional Factors Info  Code Status, Allergies Code Status Info: DNR Allergies Info: NKDA           Current Medications (06/06/2015):  This is the current hospital active medication list Current Facility-Administered Medications  Medication Dose Route Frequency Provider Last Rate Last Dose  . 0.9 %  sodium chloride infusion   Intravenous Continuous Richarda Overlie, MD   Stopped at 06/06/15 0300  . 0.9 %  sodium chloride infusion   Intravenous Continuous Nadara Mustard, MD      . acetaminophen (TYLENOL) tablet 650 mg  650 mg Oral Q6H PRN Stephani Police, PA-C       Or  . acetaminophen (TYLENOL) suppository 650 mg  650 mg Rectal Q6H PRN Stephani Police, PA-C      . alum & mag hydroxide-simeth (MAALOX/MYLANTA) 200-200-20 MG/5ML suspension 30 mL  30 mL Oral Q6H PRN Stephani Police, PA-C      . antiseptic oral rinse (  CPC / CETYLPYRIDINIUM CHLORIDE 0.05%) solution 7 mL  7 mL Mouth Rinse BID Richarda OverlieNayana Abrol, MD   7 mL at 06/05/15 2200  . aspirin EC tablet 81 mg  81 mg Oral Daily Stephani PoliceMarianne L York, PA-C   81 mg at 06/05/15 0936  . benazepril (LOTENSIN) tablet 10 mg  10 mg Oral Daily Richarda OverlieNayana Abrol, MD      . clopidogrel (PLAVIX) tablet 75 mg  75 mg Oral Daily Stephani PoliceMarianne L York, PA-C   75 mg at 06/05/15 0936  . diltiazem (CARDIZEM) 100 mg in dextrose 5 % 100 mL (1 mg/mL) infusion  5-15 mg/hr Intravenous Titrated Richarda OverlieNayana Abrol, MD      . docusate sodium (COLACE)  capsule 100 mg  100 mg Oral BID Stephani PoliceMarianne L York, PA-C   100 mg at 06/05/15 2114  . enoxaparin (LOVENOX) injection 30 mg  30 mg Subcutaneous Q24H Stephani PoliceMarianne L York, PA-C   30 mg at 06/05/15 [id number]  . furosemide (LASIX) injection 40 mg  40 mg Intravenous Once Richarda OverlieNayana Abrol, MD      . guaiFENesin (MUCINEX) 12 hr tablet 600 mg  600 mg Oral BID Richarda OverlieNayana Abrol, MD   600 mg at 06/05/15 2114  . lactated ringers infusion   Intravenous Continuous Jairo Benarswell Jackson, MD   Stopped at 06/04/15 1237  . lactose free nutrition (BOOST PLUS) liquid 237 mL  237 mL Oral TID WC Jenifer A Williams, RD   237 mL at 06/05/15 1200  . levalbuterol (XOPENEX) nebulizer solution 0.63 mg  0.63 mg Nebulization Q6H PRN Richarda OverlieNayana Abrol, MD   0.63 mg at 06/06/15 0948  . methocarbamol (ROBAXIN) tablet 500 mg  500 mg Oral Q6H PRN Nadara MustardMarcus Duda V, MD       Or  . methocarbamol (ROBAXIN) 500 mg in dextrose 5 % 50 mL IVPB  500 mg Intravenous Q6H PRN Nadara MustardMarcus Duda V, MD      . metoCLOPramide (REGLAN) tablet 5-10 mg  5-10 mg Oral Q8H PRN Nadara MustardMarcus Duda V, MD       Or  . metoCLOPramide (REGLAN) injection 5-10 mg  5-10 mg Intravenous Q8H PRN Nadara MustardMarcus Duda V, MD      . morphine (MS CONTIN) 12 hr tablet 15 mg  15 mg Oral Q12H Marianne L York, PA-C   15 mg at 06/05/15 2114  . multivitamin with minerals tablet 1 tablet  1 tablet Oral Daily Stana BuntingJenifer A Williams, RD   1 tablet at 06/05/15 0936  . ondansetron (ZOFRAN) tablet 4 mg  4 mg Oral Q6H PRN Nadara MustardMarcus Duda V, MD       Or  . ondansetron [facility](ZOFRAN) injection 4 mg  4 mg Intravenous Q6H PRN Nadara MustardMarcus Duda V, MD      . ondansetron (ZOFRAN-ODT) disintegrating tablet 4 mg  4 mg Oral Q8H PRN Stephani PoliceMarianne L York, PA-C      . oxyCODONE (Oxy IR/ROXICODONE) immediate release tablet 5-10 mg  5-10 mg Oral Q4H PRN Stephani PoliceMarianne L York, PA-C   10 mg at 06/06/15 0029  . pantoprazole (PROTONIX) EC tablet 40 mg  40 mg Oral Daily Stephani PoliceMarianne L York, PA-C   40 mg at 06/05/15 1000  . piperacillin-tazobactam (ZOSYN) IVPB 3.375 g  3.375 g Intravenous Q8H  Gerilyn Nestlehuy D Dang, RPH   3.375 g at 06/06/15 [id number]  . polyethylene glycol (MIRALAX / GLYCOLAX) packet 17 g  17 g Oral Daily PRN Stephani PoliceMarianne L York, PA-C      . pravastatin (PRAVACHOL) tablet 80 mg  80 mg Oral q1800  Stephani Police, PA-C   80 mg at 06/05/15 1609  . pregabalin (LYRICA) capsule 50 mg  50 mg Oral TID Stephani Police, PA-C   50 mg at 06/05/15 2114  . vancomycin (VANCOCIN) IVPB 1000 mg/200 mL premix  1,000 mg Intravenous Q24H Stevphen Rochester, RPH   1,000 mg at 06/06/15 8295     Discharge Medications: Please see discharge summary for a list of discharge medications.  Relevant Imaging Results:  Relevant Lab Results:  Recent Labs    Additional Information SS# [id number]  Jenita Seashore BSW Intern, [phone]

## 2015-06-07 ENCOUNTER — Inpatient Hospital Stay (HOSPITAL_COMMUNITY): Payer: Medicare Other

## 2015-06-07 LAB — BASIC METABOLIC PANEL
Anion gap: 10 (ref 5–15)
BUN: 7 mg/dL (ref 6–20)
CO2: 32 mmol/L (ref 22–32)
Calcium: 8.4 mg/dL — ABNORMAL LOW (ref 8.9–10.3)
Chloride: 97 mmol/L — ABNORMAL LOW (ref 101–111)
Creatinine, Ser: 0.41 mg/dL — ABNORMAL LOW (ref 0.44–1.00)
Glucose, Bld: 132 mg/dL — ABNORMAL HIGH (ref 65–99)
POTASSIUM: 2.9 mmol/L — AB (ref 3.5–5.1)
SODIUM: 139 mmol/L (ref 135–145)

## 2015-06-07 LAB — CBC
HEMATOCRIT: 32 % — AB (ref 36.0–46.0)
Hemoglobin: 9.7 g/dL — ABNORMAL LOW (ref 12.0–15.0)
MCH: 28.8 pg (ref 26.0–34.0)
MCHC: 30.3 g/dL (ref 30.0–36.0)
MCV: 95 fL (ref 78.0–100.0)
PLATELETS: 219 10*3/uL (ref 150–400)
RBC: 3.37 MIL/uL — AB (ref 3.87–5.11)
RDW: 14.4 % (ref 11.5–15.5)
WBC: 8.4 10*3/uL (ref 4.0–10.5)

## 2015-06-07 LAB — MAGNESIUM: Magnesium: 1.9 mg/dL (ref 1.7–2.4)

## 2015-06-07 MED ORDER — DILTIAZEM HCL 30 MG PO TABS
30.0000 mg | ORAL_TABLET | Freq: Three times a day (TID) | ORAL | Status: DC
  Administered 2015-06-07 – 2015-06-08 (×3): 30 mg via ORAL
  Filled 2015-06-07 (×3): qty 1

## 2015-06-07 MED ORDER — FUROSEMIDE 10 MG/ML IJ SOLN
40.0000 mg | Freq: Once | INTRAMUSCULAR | Status: AC
  Administered 2015-06-07: 40 mg via INTRAVENOUS
  Filled 2015-06-07: qty 4

## 2015-06-07 MED ORDER — SODIUM CHLORIDE 0.9 % IV SOLN
1.5000 g | Freq: Two times a day (BID) | INTRAVENOUS | Status: DC
  Administered 2015-06-07 – 2015-06-10 (×6): 1.5 g via INTRAVENOUS
  Filled 2015-06-07 (×9): qty 1.5

## 2015-06-07 MED ORDER — APIXABAN 2.5 MG PO TABS
2.5000 mg | ORAL_TABLET | Freq: Two times a day (BID) | ORAL | Status: DC
Start: 2015-06-07 — End: 2015-06-10
  Administered 2015-06-07 – 2015-06-10 (×5): 2.5 mg via ORAL
  Filled 2015-06-07 (×6): qty 1

## 2015-06-07 MED ORDER — POTASSIUM CHLORIDE 20 MEQ/15ML (10%) PO SOLN
20.0000 meq | Freq: Three times a day (TID) | ORAL | Status: DC
  Administered 2015-06-07 – 2015-06-09 (×7): 20 meq via ORAL
  Filled 2015-06-07 (×9): qty 15

## 2015-06-07 NOTE — Progress Notes (Addendum)
CONSULT NOTE - Initial Consult  Pharmacy Consult for apixiban, unasyn Indication: atrial fibrillation, PNA  No Known Allergies  Patient Measurements: Height: 5\' 3"  (160 cm) Weight: 77 lb 13.2 oz (35.3 kg) IBW/kg (Calculated) : 52.4  Vital Signs: Temp: 97.9 F (36.6 C) (11/29 1155) Temp Source: Oral (11/29 1155) BP: 149/53 mmHg (11/29 1113) Pulse Rate: 82 (11/29 1113)  Labs:  Recent Labs  06/05/15 0523 06/06/15 0912 06/06/15 1212 06/06/15 1540 06/06/15 2201 06/07/15 0515  HGB 9.0* 9.2*  --   --   --  9.7*  HCT 29.1* 29.4*  --   --   --  32.0*  PLT 185 194  --   --   --  219  CREATININE 0.50 0.40*  --   --   --  0.41*  TROPONINI  --   --  0.08* 0.05* <0.03  --     Estimated Creatinine Clearance: 30.2 mL/min (by C-G formula based on Cr of 0.41).   Medical History: Past Medical History  Diagnosis Date  . Hypertension   . GERD (gastroesophageal reflux disease)   . Lupus (HCC)     skin, scleraderma  . Scleroderma (HCC)   . Arthritis     scleroderma  . Peripheral vascular disease (HCC)   . PAD (peripheral artery disease) (HCC)   . Heart murmur   . Chronic diastolic (congestive) heart failure (HCC)     Medications:  Prescriptions prior to admission  Medication Sig Dispense Refill Last Dose  . acetaminophen (TYLENOL) 325 MG tablet Take 2 tablets (650 mg total) by mouth every 6 (six) hours as needed for mild pain (or Fever >/= 101).   unk  . amLODipine-benazepril (LOTREL) 5-20 MG per capsule Take 1 capsule by mouth daily.   unk  . aspirin EC 81 MG EC tablet Take 1 tablet (81 mg total) by mouth daily. 30 tablet 0 unk  . clopidogrel (PLAVIX) 75 MG tablet Take 1 tablet (75 mg total) by mouth daily.   unk  . feeding supplement, ENSURE ENLIVE, (ENSURE ENLIVE) LIQD Take 237 mLs by mouth 2 (two) times daily between meals. 237 mL 12 unk  . morphine (MS CONTIN) 15 MG 12 hr tablet Take 1 tablet (15 mg total) by mouth every 12 (twelve) hours. 60 tablet 0 unk  . omeprazole  (PRILOSEC) 20 MG capsule Take 1 capsule (20 mg total) by mouth daily. 30 capsule 2 unk  . ondansetron (ZOFRAN ODT) 4 MG disintegrating tablet Take 1 tablet (4 mg total) by mouth every 8 (eight) hours as needed for nausea or vomiting. 20 tablet 0 unk  . oxyCODONE (ROXICODONE) 5 MG immediate release tablet Take 1-2 tablets (5-10 mg total) by mouth every 4 (four) hours as needed for severe pain. 180 tablet 0 unk  . Pitavastatin Calcium (LIVALO) 2 MG TABS Take 2 mg by mouth 2 (two) times daily.   unk  . polyethylene glycol (MIRALAX / GLYCOLAX) packet Take 17 g by mouth daily as needed for moderate constipation. 14 each 0 unk  . pregabalin (LYRICA) 50 MG capsule Take 1 capsule (50 mg total) by mouth 3 (three) times daily.   unk   Scheduled:  . antiseptic oral rinse  7 mL Mouth Rinse BID  . apixaban  2.5 mg Oral BID  . benazepril  10 mg Oral Daily  . docusate sodium  100 mg Oral BID  . furosemide  40 mg Intravenous Once  . guaiFENesin  600 mg Oral BID  . lactose free  nutrition  237 mL Oral TID WC  . morphine  15 mg Oral Q12H  . multivitamin with minerals  1 tablet Oral Daily  . pantoprazole  40 mg Oral Daily  . piperacillin-tazobactam (ZOSYN)  IV  3.375 g Intravenous Q8H  . potassium chloride  20 mEq Oral TID  . pravastatin  80 mg Oral q1800  . pregabalin  50 mg Oral TID  . vancomycin  1,000 mg Intravenous Q24H    Assessment: 81 yo female with new onset afib (CHADSVASC=5) to begin apixiban. Wt noted as 35.3kg, SCr= 0.41 (CrCl estimate will not likely be accurate.), hg= 9.7 and trend up. -She was noted on aspirin and plavix for severe PAD and these have been discontinued  She is also on zosyn/vancomycin for aspiration PNA. Spoke with Dr. Susie Cassette and patient noted with continued lung opacities on CXR. Plans to begin Unasyn and d/c vancomycin.   Vanc 11/24 >> 11/29 Zosyn 11/26 >> 11/29 11/29 unasyn>>  11/25 S.aureus PCR - positive 11/25 UCx - 40K c/mL E.coli  Goal of Therapy:  Monitor  platelets by anticoagulation protocol: Yes   Plan:  -Apixiban 2.5 mg po bid -Will provide education -Unasyn 1.5gm IV q12h -Will follow renal function and clinical progress   Harland German, Pharm D 06/07/2015 12:45 PM

## 2015-06-07 NOTE — Progress Notes (Signed)
Physical Therapy Treatment Patient Details Name: Shannon Rice MRN: [id number] DOB: [birth year] Today's Date: 06/07/2015    History of Present Illness Shannon Rice is a 81 y.o. female, with lupus, scleroderma, diastolic heart failure and severe peripheral artery disease status post left AKA and has had multiple peripheral vascular catheterizations for critical limb ischemia (05/17/2015). [facility] for increasing redness and pain in the leg above her right lower extremity gangrenous toes. s/p R BKA on 11/26, likely aspiration during surgery    PT Comments    Pt admitted with above diagnosis. Pt currently with functional limitations due to balance and endurance deficits. Assissted pt to drop arm chair with use of pad with pt demonstrating ability to maintain postural stability for transfer as well as initiating movement.   Pt will benefit from skilled PT to increase their independence and safety with mobility to allow discharge to the venue listed below.    Follow Up Recommendations  SNF     Equipment Recommendations  None recommended by PT    Recommendations for Other Services       Precautions / Restrictions Precautions Precautions: Fall Precaution Comments: previous L BKA, now with R BKA Restrictions Weight Bearing Restrictions: No    Mobility  Bed Mobility Overal bed mobility: Needs Assistance Bed Mobility: Rolling;Sidelying to Sit;Sit to Sidelying Rolling: Mod assist Sidelying to sit: Mod assist       General bed mobility comments: used pad to assist with rolling and to assist hips to EOB, assist to elevate trunk  Transfers Overall transfer level: Needs assistance Equipment used:  (bed pad) Transfers: Lateral/Scoot Transfers          Lateral/Scoot Transfers: Max assist General transfer comment: Max assist and use of bed pad to slide to drop-arm recliner; Noted pt did initiate weight shift with initial  scooting  Ambulation/Gait             General Gait Details: Bil BKA- unable   Stairs            Wheelchair Mobility    Modified Rankin (Stroke Patients Only)       Balance Overall balance assessment: Needs assistance Sitting-balance support: Single extremity supported (Bil BKA) Sitting balance-Leahy Scale: Poor Sitting balance - Comments: balance requires at least one hand on rail/bed                            Cognition Arousal/Alertness: Awake/alert Behavior During Therapy: WFL for tasks assessed/performed Overall Cognitive Status: Impaired/Different from baseline Area of Impairment: Orientation;Memory Orientation Level: Disoriented to;Time;Situation   Memory: Decreased short-term memory         General Comments: Had to be reminded of her R BKA yesterday, did not recall having been at SNF prior to this admission.    Exercises Amputee Exercises Quad Sets: AROM;Both;5 reps;Supine Hip Flexion/Marching: AAROM;Both;10 reps;Supine Straight Leg Raises: AROM;Both;5 reps;Supine    General Comments        Pertinent Vitals/Pain Pain Assessment: No/denies pain  VSS    Home Living                      Prior Function            PT Goals (current goals can now be found in the care plan section) Progress towards PT goals: Progressing toward goals    Frequency  Min 2X/week    PT Plan Current plan remains appropriate  Co-evaluation             End of Session Equipment Utilized During Treatment: Gait belt Activity Tolerance: Patient tolerated treatment well Patient left: in chair;with call bell/phone within reach     Time: 1010-1035 PT Time Calculation (min) (ACUTE ONLY): 25 min  Charges:  $Therapeutic Exercise: 8-22 mins $Therapeutic Activity: 8-22 mins                    G CodesTawni Millers Rice 19-Jun-2015, 12:45 PM [employer] Acute Rehabilitation [phone] (pager)

## 2015-06-07 NOTE — Progress Notes (Signed)
Subjective:  Sitting up in chair. No new symptoms or concerns today. Remains in sinus.    Objective:  Vital Signs in the last 24 hours: Temp:  [97.9 F (36.6 C)-99.6 F (37.6 C)] 97.9 F (36.6 C) (11/29 1155) Pulse Rate:  [82-89] 82 (11/29 1113) Resp:  [12-24] 17 (11/29 1113) BP: (105-161)/(47-113) 149/53 mmHg (11/29 1113) SpO2:  [94 %-100 %] 94 % (11/29 1113) Weight:  [35.3 kg (77 lb 13.2 oz)] 35.3 kg (77 lb 13.2 oz) (11/29 0457)  Intake/Output from previous day: 11/28 0701 - 11/29 0700 In: 382.5 [P.O.:250; I.V.:120; IV Piggyback:12.5] Out: 2 [Urine:2]  Physical Exam: General appearance: alert, cooperative, cachectic, fatigued and no distress Neck: bilateral soft carotid bruit Lungs: clear to auscultation bilaterally Heart: regular rate and rhythm, S1, S2 normal, no murmur, click, rub or gallop Extremities: bilateral BKA Pulses: Femoral pulses 2+ bilaterally  Lab Results: BMP  Recent Labs  06/05/15 0523 06/06/15 0912 06/07/15 0515  NA 133* 136 139  K 3.6 3.3* 2.9*  CL 96* 99* 97*  CO2 26 27 32  GLUCOSE 93 109* 132*  BUN 14 11 7   CREATININE 0.50 0.40* 0.41*  CALCIUM 8.1* 8.1* 8.4*  GFRNONAA >60 >60 >60  GFRAA >60 >60 >60    CBC  Recent Labs Lab 06/02/15 0620  06/07/15 0515  WBC 4.8  < > 8.4  RBC 3.45*  < > 3.37*  HGB 10.0*  < > 9.7*  HCT 33.1*  < > 32.0*  PLT 152  < > 219  MCV 95.9  < > 95.0  MCH 29.0  < > 28.8  MCHC 30.2  < > 30.3  RDW 14.7  < > 14.4  LYMPHSABS 0.9  --   --   MONOABS 0.3  --   --   EOSABS 0.1  --   --   BASOSABS 0.0  --   --   < > = values in this interval not displayed.  HEMOGLOBIN A1C No results found for: HGBA1C, MPG  Cardiac Panel (last 3 results)  Recent Labs  06/06/15 1212 06/06/15 1540 06/06/15 2201  TROPONINI 0.08* 0.05* <0.03    BNP (last 3 results) No results for input(s): PROBNP in the last 8760 hours.  TSH  Recent Labs  05/25/15 0616 06/06/15 0226  TSH 5.185* 4.260    CHOLESTEROL  Recent  Labs  05/25/15 0616  CHOL 78    Hepatic Function Panel  Recent Labs  06/02/15 0620 06/04/15 0610 06/05/15 0523 06/06/15 0912  PROT 6.7 7.6 6.2* 6.1*  ALBUMIN 2.8* 2.9* 2.4* 2.4*  AST 23 23 26 31   ALT 13* 12* 10* 11*  ALKPHOS 57 66 48 53  BILITOT 0.6 0.5 0.4 1.0  BILIDIR 0.2  --   --   --   IBILI 0.4  --   --   --     Imaging: No results found.  Cardiac Studies:  EKG 06/05/2015 at 2023: afib with RVR at a rate of 111 bpm, no ischemic changes  EKG 06/06/2015 at 1054: sinus rhythm at a rate of 92bpm, biatrial enlargement, no evidence of ischemia  Telemetry 06/07/2015: sinus rhythm, rate 80-90bpm  Assessment/Plan:  1. New onset atrial fibrillation (CHA2DS2-VASCScore: Risk Score 5, Yearly risk of stroke 6.7%. OAC Has Bled: Score 2. Estimated risk of major bleeding at 1 year with OAC 1.88-3.2%) 2. PAD with critical lower limb ischemia s/p left BKA on 04/20/15 and right BKA on 06/04/15 3. Systemic sclerosis 4. Benign essential hypertension 5. Hyperlipidemia  Recommendation: Pt remains in sinus, no symptoms to concerns today. Due to high risk of recurrence of a.fib, would recommend initiation of OAC. Hgb stable and trending up.  Pharmacy consult order entered for initiation.  Erling Conte, NP-C 06/07/2015, 12:29 PM [facility] Pager: [phone] Office: [phone]

## 2015-06-07 NOTE — Progress Notes (Addendum)
[facility] PROGRESS NOTE  Shannon Rice UJW:[id number] DOB: 27-Jan-1931 DOA: 06/02/2015 PCP: Ralene OkMOREIRA,ROY, MD  Length of stay: 5   Assessment/Plan: Principal Problem:   Cellulitis of right foot Active Problems:   Scleroderma (HCC)   Peripheral artery disease (HCC)   Critical lower limb ischemia   HTN (hypertension)   Protein-calorie malnutrition, severe (HCC)   HLD (hyperlipidemia)   Chronic diastolic (congestive) heart failure (HCC)   Gangrene of foot (HCC)   Pressure ulcer     Brief summary 81 y.o. female, with lupus, scleroderma, diastolic heart failure and severe peripheral artery disease status post left AKA and has had multiple peripheral vascular catheterization catheterizations for critical limb ischemia (05/17/2015). [facility] nursing for increasing redness and pain in the leg above her right lower extremity gangrenous toes.  Found to have Acute right lower extremity cellulitis in the setting of known gangrene in the distal digits. Status post transtibial amputation 11/26 , postoperatively developed pneumonia and atrial fibrillation with RVR.   Assessment and plan  Acute right lower extremity cellulitis in the setting of known gangrene in the distal digits Continue IV vancomycin/Zosyn.  Dr. Lajoyce Cornersuda performed Transtibial amputation on the right on 11/26 Cont IV antibiotics for now in the setting of aspiration pneumonia Previously, she is status post Successful atherectomy and drug coated balloon angioplasty of the right external iliac and right common femoral and right superficial femoral artery during her last admission Hemoglobin 9.7 down from 11.6, will transfuse for hemoglobin less than 8  New-onset atrial fibrillation, converted to normal sinus Likely from acute blood loss anemia, pneumonia, stress of surgery TSH 4.26 We'll cycle cardiac enzymes Repeat EKG Paroxysmal A. fib likely due to postoperative state and  pneumonia, taper off Cardizem drip, switched to oral Cardizem , appreciate Dr. Jacinto HalimGanji  recommendations, patient started onEliquis 2.5 mg BID   ,  What should  we do  about aspirin and Plavix ??? Hold aspirin and Plavix until Cardiology makes further recommendations Most recent 2-D echo shows EF of 60-65% with grade 2 diastolic dysfunction   Acute on chronic diastolic heart failure, EF 60-65%, Lasix 40 mg IV 1  Probable aspiration during surgery  Dr Jean RosenthalJackson concerned about aspiration,cont abx, repeat chest x-ray shows new infiltrate She was suctioned aggressively during surgery , continue zosyn to vanc  Now on Modified diet with aspiration precautions Off Ventimask, now requiring 4 to 6 L of oxygen by nasal cannula Continue intermittent diuresis with Lasix Hemodynamically stable   Severe peripheral artery disease. Discontinue aspirin, Plavix. Now on eliquis.  Status post Successful atherectomy and drug coated balloon angioplasty of the right external iliac and right  common femoral and right superficial femoral artery on 11/8     Chronic pain Continue MS Contin, oxycodone, Lyrica.   Dyslipidemia Continue Livalo.  Gastroesophageal reflux disease-Protonix  Scleroderma-continue Lyrica    DVT prophylaxsis Lovenox  Code Status:      Code Status Orders        Start     Ordered   06/02/15 0940  Do not attempt resuscitation (DNR)   Continuous    Question Answer Comment  In the event of cardiac or respiratory ARREST Do not call a "code blue"   In the event of cardiac or respiratory ARREST Do not perform Intubation, CPR, defibrillation or ACLS   In the event of cardiac or respiratory ARREST Use medication by any route, position, wound care, and other measures to relive  pain and suffering. May use oxygen, suction and manual treatment of airway obstruction as needed for comfort.      06/02/15 1610    Advance Directive Documentation        Most Recent Value   Type of  Advance Directive  Out of facility DNR (pink MOST or yellow form)   Pre-existing out of facility DNR order (yellow form or pink MOST form)  Yellow form placed in chart (order not valid for inpatient use)   "MOST" Form in Place?       Family Communication: family updated about patient's clinical progress Disposition Plan: Continue stepdown     Consultants:  Dr. Lajoyce Corners  Procedures:  None  Antibiotics: Anti-infectives    Start     Dose/Rate Route Frequency Ordered Stop   06/05/15 0640  vancomycin (VANCOCIN) IVPB 1000 mg/200 mL premix     1,000 mg 200 mL/hr over 60 Minutes Intravenous Every 24 hours 06/05/15 0641     06/04/15 1200  piperacillin-tazobactam (ZOSYN) IVPB 3.375 g     3.375 g 12.5 mL/hr over 240 Minutes Intravenous Every 8 hours 06/04/15 1055     06/03/15 0600  vancomycin (VANCOCIN) 500 mg in sodium chloride 0.9 % 100 mL IVPB  Status:  Discontinued     500 mg 100 mL/hr over 60 Minutes Intravenous Every 24 hours 06/02/15 1908 06/05/15 0641   06/02/15 0945  vancomycin (VANCOCIN) IVPB 1000 mg/200 mL premix  Status:  Discontinued     1,000 mg 200 mL/hr over 60 Minutes Intravenous  Once 06/02/15 0939 06/02/15 0956   06/02/15 0615  vancomycin (VANCOCIN) 500 mg in sodium chloride 0.9 % 100 mL IVPB     500 mg 100 mL/hr over 60 Minutes Intravenous  Once 06/02/15 0614 06/02/15 0808         HPI/Subjective: Continues to be tachypneic, short of breath, requiring 4 L of oxygen, afebrile overnight,  Objective: Filed Vitals:   06/07/15 0457 06/07/15 0808 06/07/15 1113 06/07/15 1155  BP:  141/64 149/53   Pulse:   82   Temp: 98.5 F (36.9 C) 98.4 F (36.9 C)  97.9 F (36.6 C)  TempSrc: Oral Oral  Oral  Resp:  17 17   Height:      Weight: 35.3 kg (77 lb 13.2 oz)     SpO2:  94% 94%     Intake/Output Summary (Last 24 hours) at 06/07/15 1230 Last data filed at 06/07/15 0900  Gross per 24 hour  Intake    585 ml  Output      2 ml  Net    583 ml    Exam:  General:  No acute respiratory distress Lungs: Clear to auscultation bilaterally without wheezes or crackles Cardiovascular: Regular rate and rhythm without murmur gallop or rub normal S1 and S2 Abdomen: Nontender, nondistended, soft, bowel sounds positive, no rebound, no ascites, no appreciable mass Extremities: Able to move all 4. 5/5 strength in each, no effusions. Left AKA. Right lower extremity has mild erythema in the tibial area, toes are black with gangrene. Dried white pus is surrounding her toes   Data Review   Micro Results Recent Results (from the past 240 hour(s))  MRSA PCR Screening     Status: None   Collection Time: 06/02/15 12:00 PM  Result Value Ref Range Status   MRSA by PCR NEGATIVE NEGATIVE Final    Comment:        The GeneXpert MRSA Assay (FDA approved for NASAL specimens only), is  one component of a comprehensive MRSA colonization surveillance program. It is not intended to diagnose MRSA infection nor to guide or monitor treatment for MRSA infections.   Surgical PCR screen     Status: Abnormal   Collection Time: 06/03/15  6:12 PM  Result Value Ref Range Status   MRSA, PCR NEGATIVE NEGATIVE Final   Staphylococcus aureus POSITIVE (A) NEGATIVE Final    Comment:        The Xpert SA Assay (FDA approved for NASAL specimens in patients over 22 years of age), is one component of a comprehensive surveillance program.  Test performance has been validated by [facility] for patients greater than or equal to 33 year old. It is not intended to diagnose infection nor to guide or monitor treatment.   Urine culture     Status: None   Collection Time: 06/03/15  8:22 PM  Result Value Ref Range Status   Specimen Description URINE, RANDOM  Final   Special Requests NONE  Final   Culture 40,000 COLONIES/ml ESCHERICHIA COLI  Final   Report Status 06/05/2015 FINAL  Final   Organism ID, Bacteria ESCHERICHIA COLI  Final      Susceptibility   Escherichia coli - MIC*     AMPICILLIN <=2 SENSITIVE Sensitive     CEFAZOLIN <=4 SENSITIVE Sensitive     CEFTRIAXONE <=1 SENSITIVE Sensitive     CIPROFLOXACIN <=0.25 SENSITIVE Sensitive     GENTAMICIN <=1 SENSITIVE Sensitive     IMIPENEM <=0.25 SENSITIVE Sensitive     NITROFURANTOIN <=16 SENSITIVE Sensitive     TRIMETH/SULFA <=20 SENSITIVE Sensitive     AMPICILLIN/SULBACTAM <=2 SENSITIVE Sensitive     PIP/TAZO <=4 SENSITIVE Sensitive     * 40,000 COLONIES/ml ESCHERICHIA COLI    Radiology Reports Ct Abdomen Pelvis Wo Contrast  05/25/2015  CLINICAL DATA:  Acute onset of dizziness. Anemia. Assess for retroperitoneal bleed. Initial encounter. EXAM: CT ABDOMEN AND PELVIS WITHOUT CONTRAST TECHNIQUE: Multidetector CT imaging of the abdomen and pelvis was performed following the standard protocol without IV contrast. COMPARISON:  CT of the abdomen and pelvis performed 08/24/2010 FINDINGS: There is no evidence of retroperitoneal hemorrhage. Mild diffuse soft tissue edema is noted about the abdomen and pelvis, likely reflecting mild anasarca. Trace bilateral pleural fluid is noted, with bibasilar atelectasis. Mild biatrial enlargement is noted. Diffuse coronary artery calcifications are seen. The liver and spleen are unremarkable in appearance. The gallbladder is within normal limits. The pancreas and adrenal glands are unremarkable. A few small hyperdense foci within the kidneys may reflect small hyperdense cysts. There is no evidence of hydronephrosis. A 2 mm stone is noted at the upper pole of the right kidney. No obstructing ureteral stones are seen. No perinephric stranding is appreciated. No free fluid is identified. The small bowel is unremarkable in appearance. The stomach is within normal limits. No acute vascular abnormalities are seen. Diffuse calcification is noted along the abdominal aorta and its branches. The appendix is normal in caliber, without evidence of appendicitis. The colon is unremarkable in appearance. The  bladder is significantly distended and grossly unremarkable. The patient is status post hysterectomy. No suspicious adnexal masses are seen. No inguinal lymphadenopathy is seen. No acute osseous abnormalities are identified. A chronic right-sided pars defect is noted at L5. IMPRESSION: 1. No evidence of acute hemorrhage. 2. Mild diffuse soft tissue edema about the abdomen and pelvis, likely reflecting mild anasarca. 3. Trace bilateral pleural fluid, with bibasilar atelectasis. 4. Mild biatrial enlargement noted. 5. Diffuse coronary  artery calcifications seen. 6. Diffuse calcification along the abdominal aorta and branches. 7. Few small hyperdense foci within the kidneys may reflect small hyperdense cysts. 2 mm nonobstructing stone at the upper pole of the right kidney. 8. Chronic right-sided pars defect at L5. Electronically Signed   By: Roanna Raider M.D.   On: 05/25/2015 02:55   Dg Chest Port 1 View  06/05/2015  CLINICAL DATA:  Fever EXAM: PORTABLE CHEST 1 VIEW COMPARISON:  06/04/2015 FINDINGS: Cardiomegaly again noted. No pulmonary edema. Persistent streaky left perihilar atelectasis or infiltrate. New streaky atelectasis or infiltrate in right upper lobe. IMPRESSION: Persistent left perihilar atelectasis or infiltrate. New streaky atelectasis or infiltrate in right upper lobe. Cardiomegaly again noted. Electronically Signed   By: Natasha Mead M.D.   On: 06/05/2015 09:31   Dg Chest Port 1 View  06/04/2015  CLINICAL DATA:  Postop, intubated, possible aspiration EXAM: PORTABLE CHEST 1 VIEW COMPARISON:  04/17/2015 FINDINGS: Cardiomediastinal silhouette is stable. Mild hyperinflation. Stable left basilar atelectasis or scarring. There is left perihilar atelectasis or infiltrate. No pulmonary edema pre IMPRESSION: Mild hyperinflation. Cardiomegaly again noted. Left perihilar atelectasis or infiltrate. No pulmonary edema. Electronically Signed   By: Natasha Mead M.D.   On: 06/04/2015 11:03   Dg Foot Complete  Right  06/02/2015  CLINICAL DATA:  Gangrene of the foot. EXAM: RIGHT FOOT COMPLETE - 3+ VIEW COMPARISON:  None. FINDINGS: There is no fracture or dislocation. No bone destruction to suggest osteomyelitis. Soft tissue swelling of the dorsum of the foot. Arterial vascular calcifications. IMPRESSION: No acute osseous abnormality.  Osteopenia. Electronically Signed   By: Francene Boyers M.D.   On: 06/02/2015 08:55     CBC  Recent Labs Lab 06/02/15 0620 06/03/15 0406 06/04/15 0610 06/05/15 0523 06/06/15 0912 06/07/15 0515  WBC 4.8 4.5 7.8 8.8 8.2 8.4  HGB 10.0* 9.4* 11.6* 9.0* 9.2* 9.7*  HCT 33.1* 31.4* 37.8 29.1* 29.4* 32.0*  PLT 152 167 182 185 194 219  MCV 95.9 96.3 96.9 95.1 94.5 95.0  MCH 29.0 28.8 29.7 29.4 29.6 28.8  MCHC 30.2 29.9* 30.7 30.9 31.3 30.3  RDW 14.7 14.8 14.6 14.7 14.6 14.4  LYMPHSABS 0.9  --   --   --   --   --   MONOABS 0.3  --   --   --   --   --   EOSABS 0.1  --   --   --   --   --   BASOSABS 0.0  --   --   --   --   --     Chemistries   Recent Labs Lab 06/02/15 0620 06/03/15 0406 06/04/15 0610 06/05/15 0523 06/06/15 0226 06/06/15 0912 06/06/15 1212 06/07/15 0515  NA 136 138 135 133*  --  136  --  139  K 4.1 3.7 3.9 3.6  --  3.3*  --  2.9*  CL 100* 103 101 96*  --  99*  --  97*  CO2 28 29 26 26   --  27  --  32  GLUCOSE 87 123* 104* 93  --  109*  --  132*  BUN 6 10 7 14   --  11  --  7  CREATININE 0.43* 0.48 0.46 0.50  --  0.40*  --  0.41*  CALCIUM 8.8* 8.5* 8.8* 8.1*  --  8.1*  --  8.4*  MG  --   --   --   --  2.0  --  2.1 1.9  AST 23  --  23 26  --  31  --   --   ALT 13*  --  12* 10*  --  11*  --   --   ALKPHOS 57  --  66 48  --  53  --   --   BILITOT 0.6  --  0.5 0.4  --  1.0  --   --    ------------------------------------------------------------------------------------------------------------------ estimated creatinine clearance is 30.2 mL/min (by C-G formula based on Cr of  0.41). ------------------------------------------------------------------------------------------------------------------ No results for input(s): HGBA1C in the last 72 hours. ------------------------------------------------------------------------------------------------------------------ No results for input(s): CHOL, HDL, LDLCALC, TRIG, CHOLHDL, LDLDIRECT in the last 72 hours. ------------------------------------------------------------------------------------------------------------------  Recent Labs  06/06/15 0226  TSH 4.260   ------------------------------------------------------------------------------------------------------------------ No results for input(s): VITAMINB12, FOLATE, FERRITIN, TIBC, IRON, RETICCTPCT in the last 72 hours.  Coagulation profile No results for input(s): INR, PROTIME in the last 168 hours.  No results for input(s): DDIMER in the last 72 hours.  Cardiac Enzymes  Recent Labs Lab 06/06/15 1212 06/06/15 1540 06/06/15 2201  TROPONINI 0.08* 0.05* <0.03   ------------------------------------------------------------------------------------------------------------------ Invalid input(s): POCBNP   CBG: No results for input(s): GLUCAP in the last 168 hours.     Studies: No results found.    No results found for: HGBA1C Lab Results  Component Value Date   LDLCALC 43 05/25/2015   CREATININE 0.41* 06/07/2015       Scheduled Meds: . antiseptic oral rinse  7 mL Mouth Rinse BID  . aspirin EC  81 mg Oral Daily  . benazepril  10 mg Oral Daily  . clopidogrel  75 mg Oral Daily  . docusate sodium  100 mg Oral BID  . enoxaparin (LOVENOX) injection  30 mg Subcutaneous Q24H  . furosemide  40 mg Intravenous Once  . guaiFENesin  600 mg Oral BID  . lactose free nutrition  237 mL Oral TID WC  . morphine  15 mg Oral Q12H  . multivitamin with minerals  1 tablet Oral Daily  . pantoprazole  40 mg Oral Daily  . piperacillin-tazobactam (ZOSYN)  IV   3.375 g Intravenous Q8H  . potassium chloride  20 mEq Oral TID  . pravastatin  80 mg Oral q1800  . pregabalin  50 mg Oral TID  . vancomycin  1,000 mg Intravenous Q24H   Continuous Infusions: . sodium chloride Stopped (06/06/15 0300)  . sodium chloride    . diltiazem (CARDIZEM) infusion 5 mg/hr (06/07/15 0600)  . lactated ringers Stopped (06/04/15 1237)    Principal Problem:   Cellulitis of right foot Active Problems:   Scleroderma (HCC)   Peripheral artery disease (HCC)   Critical lower limb ischemia   HTN (hypertension)   Protein-calorie malnutrition, severe (HCC)   HLD (hyperlipidemia)   Chronic diastolic (congestive) heart failure (HCC)   Gangrene of foot (HCC)   Pressure ulcer    Time spent: 45 minutes   [facility]  [facility] Pager [phone]. If 7PM-7AM, please contact night-coverage at [email or web address], password [facility] 06/07/2015, 12:30 PM  LOS: 5 days

## 2015-06-08 LAB — COMPREHENSIVE METABOLIC PANEL
ALBUMIN: 2.5 g/dL — AB (ref 3.5–5.0)
ALT: 12 U/L — ABNORMAL LOW (ref 14–54)
ANION GAP: 7 (ref 5–15)
AST: 29 U/L (ref 15–41)
Alkaline Phosphatase: 60 U/L (ref 38–126)
BILIRUBIN TOTAL: 0.6 mg/dL (ref 0.3–1.2)
BUN: 8 mg/dL (ref 6–20)
CHLORIDE: 97 mmol/L — AB (ref 101–111)
CO2: 35 mmol/L — ABNORMAL HIGH (ref 22–32)
Calcium: 8.4 mg/dL — ABNORMAL LOW (ref 8.9–10.3)
Creatinine, Ser: 0.43 mg/dL — ABNORMAL LOW (ref 0.44–1.00)
GFR calc Af Amer: >60 mL/min (ref >60)
GLUCOSE: 106 mg/dL — AB (ref 65–99)
POTASSIUM: 3.6 mmol/L (ref 3.5–5.1)
Sodium: 139 mmol/L (ref 135–145)
TOTAL PROTEIN: 7.2 g/dL (ref 6.5–8.1)

## 2015-06-08 LAB — CBC
HEMATOCRIT: 30.9 % — AB (ref 36.0–46.0)
Hemoglobin: 9.3 g/dL — ABNORMAL LOW (ref 12.0–15.0)
MCH: 28.7 pg (ref 26.0–34.0)
MCHC: 30.1 g/dL (ref 30.0–36.0)
MCV: 95.4 fL (ref 78.0–100.0)
PLATELETS: 239 10*3/uL (ref 150–400)
RBC: 3.24 MIL/uL — ABNORMAL LOW (ref 3.87–5.11)
RDW: 14.4 % (ref 11.5–15.5)
WBC: 7.7 10*3/uL (ref 4.0–10.5)

## 2015-06-08 MED ORDER — DILTIAZEM HCL 30 MG PO TABS
30.0000 mg | ORAL_TABLET | Freq: Two times a day (BID) | ORAL | Status: DC
  Administered 2015-06-08 – 2015-06-09 (×4): 30 mg via ORAL
  Filled 2015-06-08 (×4): qty 1

## 2015-06-08 MED ORDER — DILTIAZEM LOAD VIA INFUSION
10.0000 mg | Freq: Once | INTRAVENOUS | Status: AC
Start: 2015-06-08 — End: 2015-06-08
  Administered 2015-06-08: 10 mg via INTRAVENOUS
  Filled 2015-06-08: qty 10

## 2015-06-08 MED ORDER — DILTIAZEM HCL 100 MG IV SOLR
INTRAVENOUS | Status: AC
  Administered 2015-06-08: 5 mg/h via INTRAVENOUS
  Filled 2015-06-08: qty 100

## 2015-06-08 MED ORDER — DILTIAZEM HCL 100 MG IV SOLR
5.0000 mg/h | INTRAVENOUS | Status: DC
  Administered 2015-06-08: 5 mg/h via INTRAVENOUS

## 2015-06-08 MED ORDER — FUROSEMIDE 10 MG/ML IJ SOLN
40.0000 mg | Freq: Once | INTRAMUSCULAR | Status: AC
  Administered 2015-06-08: 40 mg via INTRAVENOUS
  Filled 2015-06-08: qty 4

## 2015-06-08 MED ORDER — PRO-STAT SUGAR FREE PO LIQD
30.0000 mL | Freq: Two times a day (BID) | ORAL | Status: DC
  Administered 2015-06-08 – 2015-06-09 (×2): 30 mL via ORAL
  Filled 2015-06-08 (×3): qty 30

## 2015-06-08 MED ORDER — ENSURE ENLIVE PO LIQD
237.0000 mL | Freq: Two times a day (BID) | ORAL | Status: DC
  Administered 2015-06-08: 237 mL via ORAL

## 2015-06-08 NOTE — Care Management Note (Signed)
Case Management Note  Patient Details  Name: Shannon Rice MRN: [id number] Date of Birth: Feb 03, 1931  Subjective/Objective:   Adm w celluilits                 Action/Plan: from nsg facility   Expected Discharge Date:                  Expected Discharge Plan:  Skilled Nursing Facility  In-House Referral:  Clinical Social Work  Discharge planning Services  CM Consult, Medication Assistance  Post Acute Care Choice:    Choice offered to:     DME Arranged:    DME Agency:     HH Arranged:    HH Agency:     Status of Service:     Medicare Important Message Given:  Yes Date Medicare IM Given:    Medicare IM give by:    Date Additional Medicare IM Given:    Additional Medicare Important Message give by:     If discussed at Long Length of Stay Meetings, dates discussed:    Additional Comments: pt would have 24.00 copay for eliquis w no prior auth req. Left 30day free eliuqis card in room  Hanley HaysDowell, Marino Rogerson T, RN 06/08/2015, 10:01 AM

## 2015-06-08 NOTE — Progress Notes (Signed)
Patient ID: Shannon SkeetersJohnetta M Rice, female   DOB: 1931/05/01, 81 y.o.   MRN: [id number] Patient is status post most recently transtibial amputation on the right. The dressing is removed. The incision is clean and dry there is no cellulitis no drainage there is some very mild ischemic changes with no evidence of infection. Will have a dry dressing applied I will follow-up in the office.

## 2015-06-08 NOTE — Progress Notes (Addendum)
Nutrition Follow-up  DOCUMENTATION CODES:   Severe malnutrition in context of chronic illness, Underweight  INTERVENTION:   -D/c Boost Plus TID, due to poor acceptance -Ensure Enlive po BID, each supplement provides 350 kcal and 20 grams of protein -30 ml Prostat BID -Continue MVI daily -Downgrade diet to dysphagia 3 consistency for ease of intake  NUTRITION DIAGNOSIS:   Malnutrition related to chronic illness as evidenced by severe depletion of body fat, severe depletion of muscle mass.  Ongoing  GOAL:   Patient will meet greater than or equal to 90% of their needs  Unmet  MONITOR:   PO intake, Supplement acceptance, Labs, Weight trends, Skin, I & O's  REASON FOR ASSESSMENT:   Malnutrition Screening Tool, Consult Wound healing  ASSESSMENT:   81 y.o. female, with lupus, scleroderma, diastolic heart failure and severe peripheral artery disease status post left AKA and has had multiple peripheral vascular catheterization catheterizations for critical limb ischemia (05/17/2015). [facility] for increasing redness and pain in the leg above her right lower extremity gangrenous toes. The patient is mildly, but pleasantly confused and is not a good historian. Her daughter explains that [facility] to red[facility]. Found to have Acute right lower extremity cellulitis in the setting of known gangrene in the distal digits.  S/p Procedure(s) 06/04/15: AMPUTATION BELOW KNEE (Right)   Pt now with bilateral BKA. Spoke with pt at bedside, who appears much more frail and weak from last RD evaluation 5 days ago. Her voice is very weak, speaking in a whisper. She endorses continued poor appetite. Meal completion 10-20%. Pt reports she is not drinking her Boost because she doesn't like it.   Noted that pt now with her dentures in. She reports she still has difficulty with chewing and swallowing foods; will downgrade diet to  dysphagia 3 for ease of intake.   Per chart review, pt is receiving IV antibiotics for pneumonia; MD suspects pt may have aspirated during surgery.   CSW following. Plan to d/c back to SN[facility] once medically stable.   Palliative consult pending for GOC.  Labs reviewed: Glucose: 106.   Diet Order:  Diet regular Room service appropriate?: Yes; Fluid consistency:: Thin  Skin:  Wound (see comment) (stage II sacrum, lt and rt stump incisions)  Last BM:  PTA  Height:   Ht Readings from Last 1 Encounters:  06/02/15 5\' 3"  (1.6 m)    Weight:   Wt Readings from Last 1 Encounters:  06/08/15 77 lb 9.6 oz (35.2 kg)    Ideal Body Weight:  45.5 kg  BMI:  Body mass index is 13.75 kg/(m^2).   Adjusted BMI: 15.8  Estimated Nutritional Needs:   Kcal:  1300-1500  Protein:  60-75 grams  Fluid:  1.3-1.5 L  EDUCATION NEEDS:   Education needs addressed  Madisun Hargrove A. Mayford KnifeWilliams, RD, LDN, CDE Pager: [phone] After hours Pager: [phone]

## 2015-06-08 NOTE — Progress Notes (Signed)
Report called receiving RN 3w24. Pt with no complaints at the current time. Will transfer in bed.

## 2015-06-08 NOTE — Progress Notes (Addendum)
[facility] PROGRESS NOTE  Shannon Rice:[id number] DOB: 11-Sep-1930 DOA: 06/02/2015 PCP: Ralene Ok, MD  Length of stay: 6   Assessment/Plan: Principal Problem:   Cellulitis of right foot Active Problems:   Scleroderma (HCC)   Peripheral artery disease (HCC)   Critical lower limb ischemia   HTN (hypertension)   Protein-calorie malnutrition, severe (HCC)   HLD (hyperlipidemia)   Chronic diastolic (congestive) heart failure (HCC)   Gangrene of foot (HCC)   Pressure ulcer     Brief summary 81 y.o. female, with lupus, scleroderma, diastolic heart failure and severe peripheral artery disease status post left AKA and has had multiple peripheral vascular catheterization catheterizations for critical limb ischemia (05/17/2015). She presents from [facility] skilled nursing for increasing redness and pain in the leg above her right lower extremity gangrenous toes.  Found to have Acute right lower extremity cellulitis in the setting of known gangrene in the distal digits. Status post transtibial amputation 11/26 , postoperatively developed pneumonia and atrial fibrillation with RVR. Converted back to normal sinus rhythm on 11/28.   Assessment and plan  Acute right lower extremity cellulitis in the setting of known gangrene in the distal digits Continue IV vancomycin/Zosyn.  Dr. Lajoyce Corners performed Transtibial amputation on the right on 11/26 Cont IV antibiotics for now in the setting of aspiration pneumonia Previously, she is status post Successful atherectomy and drug coated balloon angioplasty of the right external iliac and right common femoral and right superficial femoral artery during her last admission Hemoglobin 9.3 down from 11.6, will transfuse for hemoglobin less than 8   New-onset atrial fibrillation, converted to normal sinus Likely from acute blood loss anemia, pneumonia, stress of surgery,CHA2DS2-VASCScore: Risk Score 5, Yearly risk of stroke 6.7%. OAC  Has Bled: Score 2. Estimated risk of major bleeding at 1 year with OAC 1.88-3.2%) TSH 4.26 Cardiac enzymes stable in the setting of atrial fibrillation cardiac Paroxysmal A. fib likely due to postoperative state and pneumonia, taper off Cardizem drip , appreciate Dr. Jacinto Halim  recommendations, patient started on Eliquis 2.5 mg BID   ,   Hold aspirin and Plavix as per Cardiology , discussed with cardiology. Most recent 2-D echo shows EF of 60-65% with grade 2 diastolic dysfunction   Acute on chronic diastolic heart failure, EF 60-65%, patient has required intermittent diuresis with IV Lasix. Will continue Lasix prn as needed. Hold lotensin and resume when BP >120's   Probable aspiration during surgery  Dr Jean Rosenthal, anesthesiologist was concerned about aspiration perioperatively, hence patient was restarted on vancomycin and Zosyn,.post op chest x-ray shows new infiltrate She was suctioned aggressively during surgery , switched  zosyn and  vanc to unasyn  Now on Modified diet with aspiration precautions Off Ventimask, now requiring 4 to 6 L of oxygen by nasal cannula Continue intermittent diuresis with Lasix Hemodynamically stable   Severe peripheral artery disease. Discontinue aspirin, Plavix per cardiology. Now on eliquis.  Status post Successful atherectomy and drug coated balloon angioplasty of the right external iliac and right  common femoral and right superficial femoral artery on 11/8     Chronic pain Continue MS Contin, oxycodone, Lyrica.   Dyslipidemia Continue Livalo.  Gastroesophageal reflux disease-Protonix  Scleroderma-continue Lyrica    DVT prophylaxsis Lovenox  Code Status:      Code Status Orders        Start     Ordered   06/02/15 0940  Do not attempt resuscitation (DNR)   Continuous  Question Answer Comment  In the event of cardiac or respiratory ARREST Do not call a "code blue"   In the event of cardiac or respiratory ARREST Do not perform  Intubation, CPR, defibrillation or ACLS   In the event of cardiac or respiratory ARREST Use medication by any route, position, wound care, and other measures to relive pain and suffering. May use oxygen, suction and manual treatment of airway obstruction as needed for comfort.      06/02/15 7829    Advance Directive Documentation        Most Recent Value   Type of Advance Directive  Out of facility DNR (pink MOST or yellow form)   Pre-existing out of facility DNR order (yellow form or pink MOST form)  Yellow form placed in chart (order not valid for inpatient use)   "MOST" Form in Place?       Family Communication: family updated about patient's clinical progress Disposition Plan: Transfer to telemetry today     Consultants:  Dr. Lajoyce Corners  Procedures:  None  Antibiotics: Anti-infectives    Start     Dose/Rate Route Frequency Ordered Stop   06/07/15 1400  ampicillin-sulbactam (UNASYN) 1.5 g in sodium chloride 0.9 % 50 mL IVPB     1.5 g 100 mL/hr over 30 Minutes Intravenous Every 12 hours 06/07/15 1305     06/05/15 0640  vancomycin (VANCOCIN) IVPB 1000 mg/200 mL premix  Status:  Discontinued     1,000 mg 200 mL/hr over 60 Minutes Intravenous Every 24 hours 06/05/15 0641 06/07/15 1258   06/04/15 1200  piperacillin-tazobactam (ZOSYN) IVPB 3.375 g  Status:  Discontinued     3.375 g 12.5 mL/hr over 240 Minutes Intravenous Every 8 hours 06/04/15 1055 06/07/15 1305   06/03/15 0600  vancomycin (VANCOCIN) 500 mg in sodium chloride 0.9 % 100 mL IVPB  Status:  Discontinued     500 mg 100 mL/hr over 60 Minutes Intravenous Every 24 hours 06/02/15 1908 06/05/15 0641   06/02/15 0945  vancomycin (VANCOCIN) IVPB 1000 mg/200 mL premix  Status:  Discontinued     1,000 mg 200 mL/hr over 60 Minutes Intravenous  Once 06/02/15 0939 06/02/15 0956   06/02/15 0615  vancomycin (VANCOCIN) 500 mg in sodium chloride 0.9 % 100 mL IVPB     500 mg 100 mL/hr over 60 Minutes Intravenous  Once 06/02/15 0614  06/02/15 0808         HPI/Subjective: Shortness of breath is improving, continues to be in sinus rhythm, 98% on 4 L of oxygen  Objective: Filed Vitals:   06/08/15 0522 06/08/15 0700 06/08/15 0814 06/08/15 1108  BP:  144/63  108/73  Pulse:  92  89  Temp:   99.6 F (37.6 C) 98.7 F (37.1 C)  TempSrc:   Oral Oral  Resp:  15  22  Height:      Weight:      SpO2: 95% 99%  98%    Intake/Output Summary (Last 24 hours) at 06/08/15 1251 Last data filed at 06/08/15 0700  Gross per 24 hour  Intake 367.84 ml  Output      2 ml  Net 365.84 ml    Exam:  General: No acute respiratory distress Lungs: Clear to auscultation bilaterally without wheezes or crackles Cardiovascular: Regular rate and rhythm without murmur gallop or rub normal S1 and S2 Abdomen: Nontender, nondistended, soft, bowel sounds positive, no rebound, no ascites, no appreciable mass Extremities: Able to move all 4. 5/5 strength in each,  no effusions. Left AKA. Right lower extremity has mild erythema in the tibial area, toes are black with gangrene. Dried white pus is surrounding her toes   Data Review   Micro Results Recent Results (from the past 240 hour(s))  MRSA PCR Screening     Status: None   Collection Time: 06/02/15 12:00 PM  Result Value Ref Range Status   MRSA by PCR NEGATIVE NEGATIVE Final    Comment:        The GeneXpert MRSA Assay (FDA approved for NASAL specimens only), is one component of a comprehensive MRSA colonization surveillance program. It is not intended to diagnose MRSA infection nor to guide or monitor treatment for MRSA infections.   Surgical PCR screen     Status: Abnormal   Collection Time: 06/03/15  6:12 PM  Result Value Ref Range Status   MRSA, PCR NEGATIVE NEGATIVE Final   Staphylococcus aureus POSITIVE (A) NEGATIVE Final    Comment:        The Xpert SA Assay (FDA approved for NASAL specimens in patients over 82 years of age), is one component of a comprehensive  surveillance program.  Test performance has been validated by [facility] for patients greater than or equal to 54 year old. It is not intended to diagnose infection nor to guide or monitor treatment.   Urine culture     Status: None   Collection Time: 06/03/15  8:22 PM  Result Value Ref Range Status   Specimen Description URINE, RANDOM  Final   Special Requests NONE  Final   Culture 40,000 COLONIES/ml ESCHERICHIA COLI  Final   Report Status 06/05/2015 FINAL  Final   Organism ID, Bacteria ESCHERICHIA COLI  Final      Susceptibility   Escherichia coli - MIC*    AMPICILLIN <=2 SENSITIVE Sensitive     CEFAZOLIN <=4 SENSITIVE Sensitive     CEFTRIAXONE <=1 SENSITIVE Sensitive     CIPROFLOXACIN <=0.25 SENSITIVE Sensitive     GENTAMICIN <=1 SENSITIVE Sensitive     IMIPENEM <=0.25 SENSITIVE Sensitive     NITROFURANTOIN <=16 SENSITIVE Sensitive     TRIMETH/SULFA <=20 SENSITIVE Sensitive     AMPICILLIN/SULBACTAM <=2 SENSITIVE Sensitive     PIP/TAZO <=4 SENSITIVE Sensitive     * 40,000 COLONIES/ml ESCHERICHIA COLI    Radiology Reports Ct Abdomen Pelvis Wo Contrast  05/25/2015  CLINICAL DATA:  Acute onset of dizziness. Anemia. Assess for retroperitoneal bleed. Initial encounter. EXAM: CT ABDOMEN AND PELVIS WITHOUT CONTRAST TECHNIQUE: Multidetector CT imaging of the abdomen and pelvis was performed following the standard protocol without IV contrast. COMPARISON:  CT of the abdomen and pelvis performed 08/24/2010 FINDINGS: There is no evidence of retroperitoneal hemorrhage. Mild diffuse soft tissue edema is noted about the abdomen and pelvis, likely reflecting mild anasarca. Trace bilateral pleural fluid is noted, with bibasilar atelectasis. Mild biatrial enlargement is noted. Diffuse coronary artery calcifications are seen. The liver and spleen are unremarkable in appearance. The gallbladder is within normal limits. The pancreas and adrenal glands are unremarkable. A few small hyperdense foci  within the kidneys may reflect small hyperdense cysts. There is no evidence of hydronephrosis. A 2 mm stone is noted at the upper pole of the right kidney. No obstructing ureteral stones are seen. No perinephric stranding is appreciated. No free fluid is identified. The small bowel is unremarkable in appearance. The stomach is within normal limits. No acute vascular abnormalities are seen. Diffuse calcification is noted along the abdominal aorta and its branches.  The appendix is normal in caliber, without evidence of appendicitis. The colon is unremarkable in appearance. The bladder is significantly distended and grossly unremarkable. The patient is status post hysterectomy. No suspicious adnexal masses are seen. No inguinal lymphadenopathy is seen. No acute osseous abnormalities are identified. A chronic right-sided pars defect is noted at L5. IMPRESSION: 1. No evidence of acute hemorrhage. 2. Mild diffuse soft tissue edema about the abdomen and pelvis, likely reflecting mild anasarca. 3. Trace bilateral pleural fluid, with bibasilar atelectasis. 4. Mild biatrial enlargement noted. 5. Diffuse coronary artery calcifications seen. 6. Diffuse calcification along the abdominal aorta and branches. 7. Few small hyperdense foci within the kidneys may reflect small hyperdense cysts. 2 mm nonobstructing stone at the upper pole of the right kidney. 8. Chronic right-sided pars defect at L5. Electronically Signed   By: Roanna Raider M.D.   On: 05/25/2015 02:55   Dg Chest Port 1 View  06/07/2015  CLINICAL DATA:  Shortness of breath.  Chest pain. EXAM: PORTABLE CHEST 1 VIEW COMPARISON:  06/05/2015, 06/04/2015, 04/17/2015, 04/13/2015 FINDINGS: Stable cardiomegaly. Thoracic aorta atherosclerotic calcification noted. The central bronchial airways appear thickened. There is streaky opacity in the right upper lobe, unchanged. Obscuration of the left hemidiaphragm blunting of the left costophrenic angle is unchanged compared to  yesterday's chest radiograph. There is suggestion of periosteal reaction about the lateral seventh and are eighth ribs, for which healing fractures cannot be excluded. Dedicated rib radiographs could be useful if indicated. Marked right acromioclavicular joint region degenerative changes. High-riding right humeral head suggests chronic rotator cuff disease. IMPRESSION: No significant change in aeration compared to the chest radiograph of 06/05/2015. Streaky opacities in the right upper lung could reflect airspace disease and/or atelectasis/scarring. Left basilar opacity obscures left hemidiaphragm and could reflect airspace disease, atelectasis, and/or effusion. Cannot exclude healing right lateral rib fractures as described above. Electronically Signed   By: Britta Mccreedy M.D.   On: 06/07/2015 12:56   Dg Chest Port 1 View  06/05/2015  CLINICAL DATA:  Fever EXAM: PORTABLE CHEST 1 VIEW COMPARISON:  06/04/2015 FINDINGS: Cardiomegaly again noted. No pulmonary edema. Persistent streaky left perihilar atelectasis or infiltrate. New streaky atelectasis or infiltrate in right upper lobe. IMPRESSION: Persistent left perihilar atelectasis or infiltrate. New streaky atelectasis or infiltrate in right upper lobe. Cardiomegaly again noted. Electronically Signed   By: Natasha Mead M.D.   On: 06/05/2015 09:31   Dg Chest Port 1 View  06/04/2015  CLINICAL DATA:  Postop, intubated, possible aspiration EXAM: PORTABLE CHEST 1 VIEW COMPARISON:  04/17/2015 FINDINGS: Cardiomediastinal silhouette is stable. Mild hyperinflation. Stable left basilar atelectasis or scarring. There is left perihilar atelectasis or infiltrate. No pulmonary edema pre IMPRESSION: Mild hyperinflation. Cardiomegaly again noted. Left perihilar atelectasis or infiltrate. No pulmonary edema. Electronically Signed   By: Natasha Mead M.D.   On: 06/04/2015 11:03   Dg Foot Complete Right  06/02/2015  CLINICAL DATA:  Gangrene of the foot. EXAM: RIGHT FOOT COMPLETE  - 3+ VIEW COMPARISON:  None. FINDINGS: There is no fracture or dislocation. No bone destruction to suggest osteomyelitis. Soft tissue swelling of the dorsum of the foot. Arterial vascular calcifications. IMPRESSION: No acute osseous abnormality.  Osteopenia. Electronically Signed   By: Francene Boyers M.D.   On: 06/02/2015 08:55     CBC  Recent Labs Lab 06/02/15 0620  06/04/15 0610 06/05/15 0523 06/06/15 0912 06/07/15 0515 06/08/15 0215  WBC 4.8  < > 7.8 8.8 8.2 8.4 7.7  HGB 10.0*  < >  11.6* 9.0* 9.2* 9.7* 9.3*  HCT 33.1*  < > 37.8 29.1* 29.4* 32.0* 30.9*  PLT 152  < > 182 185 194 219 239  MCV 95.9  < > 96.9 95.1 94.5 95.0 95.4  MCH 29.0  < > 29.7 29.4 29.6 28.8 28.7  MCHC 30.2  < > 30.7 30.9 31.3 30.3 30.1  RDW 14.7  < > 14.6 14.7 14.6 14.4 14.4  LYMPHSABS 0.9  --   --   --   --   --   --   MONOABS 0.3  --   --   --   --   --   --   EOSABS 0.1  --   --   --   --   --   --   BASOSABS 0.0  --   --   --   --   --   --   < > = values in this interval not displayed.  Chemistries   Recent Labs Lab 06/02/15 0620  06/04/15 0610 06/05/15 0523 06/06/15 0226 06/06/15 0912 06/06/15 1212 06/07/15 0515 06/08/15 0215  NA 136  < > 135 133*  --  136  --  139 139  K 4.1  < > 3.9 3.6  --  3.3*  --  2.9* 3.6  CL 100*  < > 101 96*  --  99*  --  97* 97*  CO2 28  < > 26 26  --  27  --  32 35*  GLUCOSE 87  < > 104* 93  --  109*  --  132* 106*  BUN 6  < > 7 14  --  11  --  7 8  CREATININE 0.43*  < > 0.46 0.50  --  0.40*  --  0.41* 0.43*  CALCIUM 8.8*  < > 8.8* 8.1*  --  8.1*  --  8.4* 8.4*  MG  --   --   --   --  2.0  --  2.1 1.9  --   AST 23  --  23 26  --  31  --   --  29  ALT 13*  --  12* 10*  --  11*  --   --  12*  ALKPHOS 57  --  66 48  --  53  --   --  60  BILITOT 0.6  --  0.5 0.4  --  1.0  --   --  0.6  < > = values in this interval not displayed. ------------------------------------------------------------------------------------------------------------------ estimated creatinine  clearance is 30.1 mL/min (by C-G formula based on Cr of 0.43). ------------------------------------------------------------------------------------------------------------------ No results for input(s): HGBA1C in the last 72 hours. ------------------------------------------------------------------------------------------------------------------ No results for input(s): CHOL, HDL, LDLCALC, TRIG, CHOLHDL, LDLDIRECT in the last 72 hours. ------------------------------------------------------------------------------------------------------------------  Recent Labs  06/06/15 0226  TSH 4.260   ------------------------------------------------------------------------------------------------------------------ No results for input(s): VITAMINB12, FOLATE, FERRITIN, TIBC, IRON, RETICCTPCT in the last 72 hours.  Coagulation profile No results for input(s): INR, PROTIME in the last 168 hours.  No results for input(s): DDIMER in the last 72 hours.  Cardiac Enzymes  Recent Labs Lab 06/06/15 1212 06/06/15 1540 06/06/15 2201  TROPONINI 0.08* 0.05* <0.03   ------------------------------------------------------------------------------------------------------------------ Invalid input(s): POCBNP   CBG: No results for input(s): GLUCAP in the last 168 hours.     Studies: Dg Chest Port 1 View  06/07/2015  CLINICAL DATA:  Shortness of breath.  Chest pain. EXAM: PORTABLE CHEST 1 VIEW COMPARISON:  06/05/2015, 06/04/2015, 04/17/2015, 04/13/2015  FINDINGS: Stable cardiomegaly. Thoracic aorta atherosclerotic calcification noted. The central bronchial airways appear thickened. There is streaky opacity in the right upper lobe, unchanged. Obscuration of the left hemidiaphragm blunting of the left costophrenic angle is unchanged compared to yesterday's chest radiograph. There is suggestion of periosteal reaction about the lateral seventh and are eighth ribs, for which healing fractures cannot be excluded.  Dedicated rib radiographs could be useful if indicated. Marked right acromioclavicular joint region degenerative changes. High-riding right humeral head suggests chronic rotator cuff disease. IMPRESSION: No significant change in aeration compared to the chest radiograph of 06/05/2015. Streaky opacities in the right upper lung could reflect airspace disease and/or atelectasis/scarring. Left basilar opacity obscures left hemidiaphragm and could reflect airspace disease, atelectasis, and/or effusion. Cannot exclude healing right lateral rib fractures as described above. Electronically Signed   By: Britta MccreedySusan  Turner M.D.   On: 06/07/2015 12:56      No results found for: HGBA1C Lab Results  Component Value Date   LDLCALC 43 05/25/2015   CREATININE 0.43* 06/08/2015       Scheduled Meds: . ampicillin-sulbactam (UNASYN) IV  1.5 g Intravenous Q12H  . antiseptic oral rinse  7 mL Mouth Rinse BID  . apixaban  2.5 mg Oral BID  . benazepril  10 mg Oral Daily  . docusate sodium  100 mg Oral BID  . feeding supplement (ENSURE ENLIVE)  237 mL Oral BID BM  . feeding supplement (PRO-STAT SUGAR FREE 64)  30 mL Oral BID  . guaiFENesin  600 mg Oral BID  . morphine  15 mg Oral Q12H  . multivitamin with minerals  1 tablet Oral Daily  . pantoprazole  40 mg Oral Daily  . potassium chloride  20 mEq Oral TID  . pravastatin  80 mg Oral q1800  . pregabalin  50 mg Oral TID   Continuous Infusions: . sodium chloride Stopped (06/08/15 0522)  . sodium chloride Stopped (06/07/15 2100)  . diltiazem (CARDIZEM) infusion 5 mg/hr (06/08/15 0631)  . lactated ringers Stopped (06/04/15 1237)    Principal Problem:   Cellulitis of right foot Active Problems:   Scleroderma (HCC)   Peripheral artery disease (HCC)   Critical lower limb ischemia   HTN (hypertension)   Protein-calorie malnutrition, severe (HCC)   HLD (hyperlipidemia)   Chronic diastolic (congestive) heart failure (HCC)   Gangrene of foot (HCC)   Pressure  ulcer    Time spent: 45 minutes   [facility] HospitalBROL,Arpan Eskelson  [facility] Pager [phone]. If 7PM-7AM, please contact night-coverage at [email or web address], password [email or web address] 06/08/2015, 12:51 PM  LOS: 6 days

## 2015-06-08 NOTE — Clinical Social Work Note (Signed)
Patient continues to have bed at [facility] once medically stable for DC. [facility].  Roddie McBryant Laurice Iglesia MSW, Ellison BayLCSW, [place]manLCASA, [phone]

## 2015-06-08 NOTE — Discharge Instructions (Signed)

## 2015-06-09 ENCOUNTER — Inpatient Hospital Stay (HOSPITAL_COMMUNITY): Payer: Medicare Other

## 2015-06-09 DIAGNOSIS — I5032 Chronic diastolic (congestive) heart failure: Secondary | ICD-10-CM

## 2015-06-09 DIAGNOSIS — L03115 Cellulitis of right lower limb: Secondary | ICD-10-CM

## 2015-06-09 DIAGNOSIS — Z7189 Other specified counseling: Secondary | ICD-10-CM | POA: Insufficient documentation

## 2015-06-09 DIAGNOSIS — J690 Pneumonitis due to inhalation of food and vomit: Secondary | ICD-10-CM

## 2015-06-09 DIAGNOSIS — Z515 Encounter for palliative care: Secondary | ICD-10-CM | POA: Insufficient documentation

## 2015-06-09 DIAGNOSIS — E43 Unspecified severe protein-calorie malnutrition: Secondary | ICD-10-CM

## 2015-06-09 DIAGNOSIS — E785 Hyperlipidemia, unspecified: Secondary | ICD-10-CM

## 2015-06-09 LAB — BRAIN NATRIURETIC PEPTIDE: B NATRIURETIC PEPTIDE 5: 299.6 pg/mL — AB (ref 0.0–100.0)

## 2015-06-09 MED ORDER — SENNOSIDES-DOCUSATE SODIUM 8.6-50 MG PO TABS
2.0000 | ORAL_TABLET | Freq: Every day | ORAL | Status: DC
Start: 2015-06-09 — End: 2015-06-10
  Administered 2015-06-09: 2 via ORAL
  Filled 2015-06-09: qty 2

## 2015-06-09 MED ORDER — FUROSEMIDE 10 MG/ML IJ SOLN
20.0000 mg | Freq: Once | INTRAMUSCULAR | Status: AC
  Administered 2015-06-09: 20 mg via INTRAVENOUS
  Filled 2015-06-09: qty 2

## 2015-06-09 MED ORDER — STARCH (THICKENING) PO POWD
ORAL | Status: DC | PRN
Start: 2015-06-09 — End: 2015-06-10
  Filled 2015-06-09: qty 227

## 2015-06-09 MED ORDER — POLYETHYLENE GLYCOL 3350 17 G PO PACK
17.0000 g | PACK | Freq: Every day | ORAL | Status: DC
  Filled 2015-06-09: qty 1

## 2015-06-09 MED ORDER — METOPROLOL TARTRATE 1 MG/ML IV SOLN
2.5000 mg | Freq: Once | INTRAVENOUS | Status: AC
  Administered 2015-06-09: 2.5 mg via INTRAVENOUS
  Filled 2015-06-09: qty 5

## 2015-06-09 MED ORDER — MORPHINE SULFATE (CONCENTRATE) 10 MG/0.5ML PO SOLN
5.0000 mg | ORAL | Status: DC | PRN
  Administered 2015-06-09: 5 mg via ORAL
  Filled 2015-06-09: qty 0.5

## 2015-06-09 NOTE — Progress Notes (Signed)
Changed dressing over sacrum. Possible wound consult needed just to watch the stage 2 on her sacrum.   Christie Viscomi, Charlaine DaltonAnn Brooke RN

## 2015-06-09 NOTE — Progress Notes (Addendum)
PATIENT DETAILS Name: Shannon Rice Age: 81 y.o. Sex: female Date of Birth: Jan 17, 1931 Admit Date: 06/02/2015 Admitting Physician Shannon Cory, Rice Shannon Rice  Subjective: Lying in bed-feels very weak. Her voice is very soft.  Assessment/Plan: Principal Problem: Critical ischemia/gangrene to right foot: Secondary to underlying peripheral vascular disease, underwent transtibial amputation on 11/26.Previously, she is status post atherectomy and drug coated balloon angioplasty of the right external iliac and right common femoral and right superficial femoral artery during her last admission  Active Problems: A. fib RVR: Back in sinus rhythm,,CHA2DS2-VASCScore: Risk 5-started on Eliquis, continue rate control with Cardizem.  Aspiration pneumonitis: Suspected aspiration during surgery, continue Unasyn. Appears very weak/deconditioned-ordered SLP evaluation  Addendum: MBS results noted-have d/w daughter Shannon Rice over the phone-explained aspiration risks with Dys 1 diet-family ok-accepting risks of starting diet.   Acute on chronic diastolic heart failure: EF 60-65% via TTE on October 16, suspect mild decompensation-repeat Lasix 1 today. Follow intake output/daily weights.  Acute hypoxic respiratory failure: Secondary to pneumonia/CHF, titrate of O2  Severe peripheral artery disease: Unfortunately despite vascular catheterization and atherectomy/balloon angioplasty-she is status post left BKA on 04/20/15, and underwent right BKA on 11/26. Now off all antiplatelet agents-as on anticoagulation.  Anemia: Likely secondary to acute on chronic illness: Follow. Hemoglobin stable for outpatient follow-up.  Chronic pain syndrome: Continue Shannon Contin, Lyrica and prn oxycodone  Dyslipidemia: Continue statin  GERD: Continue PPI  Protein-calorie malnutrition, severe: Continue supplements  History of scleroderma  Failure to thrive syndrome/severe  deconditioning: Continue PT evaluation/SLP evaluation and supplements. Unfortunately appears very frail-now with bilateral BKA's-bedbound-poor long-term prognoses. Palliative care consulted.  Disposition: Remain inpatient-SNF in the next few days  Antimicrobial agents  See below  Anti-infectives    Start     Dose/Rate Route Frequency Ordered Stop   06/07/15 1400  ampicillin-sulbactam (UNASYN) 1.5 g in sodium chloride 0.9 % 50 mL IVPB     1.5 g 100 mL/hr over 30 Minutes Intravenous Every 12 hours 06/07/15 1305     06/05/15 0640  vancomycin (VANCOCIN) IVPB 1000 mg/200 mL premix  Status:  Discontinued     1,000 mg 200 mL/hr over 60 Minutes Intravenous Every 24 hours 06/05/15 0641 06/07/15 1258   06/04/15 1200  piperacillin-tazobactam (ZOSYN) IVPB 3.375 g  Status:  Discontinued     3.375 g 12.5 mL/hr over 240 Minutes Intravenous Every 8 hours 06/04/15 1055 06/07/15 1305   06/03/15 0600  vancomycin (VANCOCIN) 500 mg in sodium chloride 0.9 % 100 mL IVPB  Status:  Discontinued     500 mg 100 mL/hr over 60 Minutes Intravenous Every 24 hours 06/02/15 1908 06/05/15 0641   06/02/15 0945  vancomycin (VANCOCIN) IVPB 1000 mg/200 mL premix  Status:  Discontinued     1,000 mg 200 mL/hr over 60 Minutes Intravenous  Once 06/02/15 0939 06/02/15 0956   06/02/15 0615  vancomycin (VANCOCIN) 500 mg in sodium chloride 0.9 % 100 mL IVPB     500 mg 100 mL/hr over 60 Minutes Intravenous  Once 06/02/15 1308 06/02/15 6578      DVT Prophylaxis: Eliquis  Code Status:  DNR  Family Communication Shannon Rice-daughter-over the phine  Procedures: Right BKA 11/26  CONSULTS:  cardiology and orthopedic surgery  Time spent 40 minutes-Greater than 50% of this time was spent in counseling, explanation of diagnosis, planning of further management, and coordination of care.  MEDICATIONS: Scheduled Meds: . ampicillin-sulbactam (UNASYN)  IV  1.5 g Intravenous Q12H  . antiseptic oral rinse  7 mL Mouth Rinse BID   . apixaban  2.5 mg Oral BID  . diltiazem  30 mg Oral Q12H  . docusate sodium  100 mg Oral BID  . feeding supplement (ENSURE ENLIVE)  237 mL Oral BID BM  . feeding supplement (PRO-STAT SUGAR FREE 64)  30 mL Oral BID  . guaiFENesin  600 mg Oral BID  . morphine  15 mg Oral Q12H  . multivitamin with minerals  1 tablet Oral Daily  . pantoprazole  40 mg Oral Daily  . potassium chloride  20 mEq Oral TID  . pravastatin  80 mg Oral q1800  . pregabalin  50 mg Oral TID   Continuous Infusions: . sodium chloride Stopped (06/08/15 0522)  . sodium chloride Stopped (06/07/15 2100)   PRN Meds:.acetaminophen **OR** acetaminophen, alum & mag hydroxide-simeth, levalbuterol, methocarbamol **OR** methocarbamol (ROBAXIN)  IV, metoCLOPramide **OR** metoCLOPramide (REGLAN) injection, ondansetron **OR** ondansetron (ZOFRAN) IV, ondansetron, oxyCODONE, polyethylene glycol    PHYSICAL EXAM: Vital signs in last 24 hours: Filed Vitals:   06/08/15 1108 06/08/15 1600 06/08/15 1959 06/09/15 0502  BP: 108/73 127/45 137/42 120/38  Pulse: 89 77 83 99  Temp: 98.7 F (37.1 C) 98.2 F (36.8 C) 98.2 F (36.8 C) 100.2 F (37.9 C)  TempSrc: Oral Oral Axillary Axillary  Resp: 22 20    Height:      Weight:    34.428 kg (75 lb 14.4 oz)  SpO2: 98% 98% 97% 100%    Weight change: -0.772 kg (-1 lb 11.2 oz) Filed Weights   06/07/15 0457 06/08/15 0300 06/09/15 0502  Weight: 35.3 kg (77 lb 13.2 oz) 35.2 kg (77 lb 9.6 oz) 34.428 kg (75 lb 14.4 oz)   Body mass index is 13.45 kg/(m^2).   Gen Exam: Awake-very weak/deconditioned-speaks very softly. Lying in bed without any acute distress Neck: Supple, No JVD.   Chest: Coarse-mostly transmitted upper airway sounds-moving air bilaterally. CVS: S1 S2 Regular, no murmurs.  Abdomen: soft, BS +, non tender, non distended.  Extremities: Bilateral BKA Neurologic: Non Focal.   Skin: No Rash.   Wounds: N/A.    Intake/Output from previous day:  Intake/Output Summary (Last  24 hours) at 06/09/15 1137 Last data filed at 06/09/15 0900  Gross per 24 hour  Intake    725 ml  Output      2 ml  Net    723 ml     LAB RESULTS: CBC  Recent Labs Lab 06/04/15 0610 06/05/15 0523 06/06/15 0912 06/07/15 0515 06/08/15 0215  WBC 7.8 8.8 8.2 8.4 7.7  HGB 11.6* 9.0* 9.2* 9.7* 9.3*  HCT 37.8 29.1* 29.4* 32.0* 30.9*  PLT 182 185 194 219 239  MCV 96.9 95.1 94.5 95.0 95.4  MCH 29.7 29.4 29.6 28.8 28.7  MCHC 30.7 30.9 31.3 30.3 30.1  RDW 14.6 14.7 14.6 14.4 14.4    Chemistries   Recent Labs Lab 06/04/15 0610 06/05/15 0523 06/06/15 0226 06/06/15 0912 06/06/15 1212 06/07/15 0515 06/08/15 0215  NA 135 133*  --  136  --  139 139  K 3.9 3.6  --  3.3*  --  2.9* 3.6  CL 101 96*  --  99*  --  97* 97*  CO2 26 26  --  27  --  32 35*  GLUCOSE 104* 93  --  109*  --  132* 106*  BUN 7 14  --  11  --  7  8  CREATININE 0.46 0.50  --  0.40*  --  0.41* 0.43*  CALCIUM 8.8* 8.1*  --  8.1*  --  8.4* 8.4*  MG  --   --  2.0  --  2.1 1.9  --     CBG: No results for input(s): GLUCAP in the last 168 hours.  GFR Estimated Creatinine Clearance: 29.4 mL/min (by C-G formula based on Cr of 0.43).  Coagulation profile No results for input(s): INR, PROTIME in the last 168 hours.  Cardiac Enzymes  Recent Labs Lab 06/06/15 1212 06/06/15 1540 06/06/15 2201  TROPONINI 0.08* 0.05* <0.03    Invalid input(s): POCBNP No results for input(s): DDIMER in the last 72 hours. No results for input(s): HGBA1C in the last 72 hours. No results for input(s): CHOL, HDL, LDLCALC, TRIG, CHOLHDL, LDLDIRECT in the last 72 hours. No results for input(s): TSH, T4TOTAL, T3FREE, THYROIDAB in the last 72 hours.  Invalid input(s): FREET3 No results for input(s): VITAMINB12, FOLATE, FERRITIN, TIBC, IRON, RETICCTPCT in the last 72 hours. No results for input(s): LIPASE, AMYLASE in the last 72 hours.  Urine Studies No results for input(s): UHGB, CRYS in the last 72 hours.  Invalid input(s):  UACOL, UAPR, USPG, UPH, UTP, UGL, UKET, UBIL, UNIT, UROB, ULEU, UEPI, UWBC, URBC, UBAC, CAST, UCOM, BILUA  MICROBIOLOGY: Recent Results (from the past 240 hour(s))  MRSA PCR Screening     Status: None   Collection Time: 06/02/15 12:00 PM  Result Value Ref Range Status   MRSA by PCR NEGATIVE NEGATIVE Final    Comment:        The GeneXpert MRSA Assay (FDA approved for NASAL specimens only), is one component of a comprehensive MRSA colonization surveillance program. It is not intended to diagnose MRSA infection nor to guide or monitor treatment for MRSA infections.   Surgical PCR screen     Status: Abnormal   Collection Time: 06/03/15  6:12 PM  Result Value Ref Range Status   MRSA, PCR NEGATIVE NEGATIVE Final   Staphylococcus aureus POSITIVE (A) NEGATIVE Final    Comment:        The Xpert SA Assay (FDA approved for NASAL specimens in patients over 51 years of age), is one component of a comprehensive surveillance program.  Test performance has been validated by [facility] for patients greater than or equal to 90 year old. It is not intended to diagnose infection nor to guide or monitor treatment.   Urine culture     Status: None   Collection Time: 06/03/15  8:22 PM  Result Value Ref Range Status   Specimen Description URINE, RANDOM  Final   Special Requests NONE  Final   Culture 40,000 COLONIES/ml ESCHERICHIA COLI  Final   Report Status 06/05/2015 FINAL  Final   Organism ID, Bacteria ESCHERICHIA COLI  Final      Susceptibility   Escherichia coli - MIC*    AMPICILLIN <=2 SENSITIVE Sensitive     CEFAZOLIN <=4 SENSITIVE Sensitive     CEFTRIAXONE <=1 SENSITIVE Sensitive     CIPROFLOXACIN <=0.25 SENSITIVE Sensitive     GENTAMICIN <=1 SENSITIVE Sensitive     IMIPENEM <=0.25 SENSITIVE Sensitive     NITROFURANTOIN <=16 SENSITIVE Sensitive     TRIMETH/SULFA <=20 SENSITIVE Sensitive     AMPICILLIN/SULBACTAM <=2 SENSITIVE Sensitive     PIP/TAZO <=4 SENSITIVE Sensitive      * 40,000 COLONIES/ml ESCHERICHIA COLI    RADIOLOGY STUDIES/RESULTS: Ct Abdomen Pelvis Wo Contrast  05/25/2015  CLINICAL  DATA:  Acute onset of dizziness. Anemia. Assess for retroperitoneal bleed. Initial encounter. EXAM: CT ABDOMEN AND PELVIS WITHOUT CONTRAST TECHNIQUE: Multidetector CT imaging of the abdomen and pelvis was performed following the standard protocol without IV contrast. COMPARISON:  CT of the abdomen and pelvis performed 08/24/2010 FINDINGS: There is no evidence of retroperitoneal hemorrhage. Mild diffuse soft tissue edema is noted about the abdomen and pelvis, likely reflecting mild anasarca. Trace bilateral pleural fluid is noted, with bibasilar atelectasis. Mild biatrial enlargement is noted. Diffuse coronary artery calcifications are seen. The liver and spleen are unremarkable in appearance. The gallbladder is within normal limits. The pancreas and adrenal glands are unremarkable. A few small hyperdense foci within the kidneys may reflect small hyperdense cysts. There is no evidence of hydronephrosis. A 2 mm stone is noted at the upper pole of the right kidney. No obstructing ureteral stones are seen. No perinephric stranding is appreciated. No free fluid is identified. The small bowel is unremarkable in appearance. The stomach is within normal limits. No acute vascular abnormalities are seen. Diffuse calcification is noted along the abdominal aorta and its branches. The appendix is normal in caliber, without evidence of appendicitis. The colon is unremarkable in appearance. The bladder is significantly distended and grossly unremarkable. The patient is status post hysterectomy. No suspicious adnexal masses are seen. No inguinal lymphadenopathy is seen. No acute osseous abnormalities are identified. A chronic right-sided pars defect is noted at L5. IMPRESSION: 1. No evidence of acute hemorrhage. 2. Mild diffuse soft tissue edema about the abdomen and pelvis, likely reflecting mild anasarca.  3. Trace bilateral pleural fluid, with bibasilar atelectasis. 4. Mild biatrial enlargement noted. 5. Diffuse coronary artery calcifications seen. 6. Diffuse calcification along the abdominal aorta and branches. 7. Few small hyperdense foci within the kidneys may reflect small hyperdense cysts. 2 mm nonobstructing stone at the upper pole of the right kidney. 8. Chronic right-sided pars defect at L5. Electronically Signed   By: Roanna Raider M.D.   On: 05/25/2015 02:55   Dg Chest Port 1 View  06/09/2015  CLINICAL DATA:  Patient with chest congestion. History of hypertension. EXAM: PORTABLE CHEST 1 VIEW COMPARISON:  Chest radiograph 06/07/2015. FINDINGS: Monitoring leads overlie the patient. Stable enlarged cardiac and mediastinal contours. Unchanged retrocardiac pulmonary consolidation. Persistent heterogeneous opacities right upper lung. No definite pleural effusion or pneumothorax. IMPRESSION: Unchanged retrocardiac consolidation and right upper lung heterogeneous opacities which may represent atelectasis, infection and/or chronic pulmonary changes. Electronically Signed   By: Annia Belt M.D.   On: 06/09/2015 09:53   Dg Chest Port 1 View  06/07/2015  CLINICAL DATA:  Shortness of breath.  Chest pain. EXAM: PORTABLE CHEST 1 VIEW COMPARISON:  06/05/2015, 06/04/2015, 04/17/2015, 04/13/2015 FINDINGS: Stable cardiomegaly. Thoracic aorta atherosclerotic calcification noted. The central bronchial airways appear thickened. There is streaky opacity in the right upper lobe, unchanged. Obscuration of the left hemidiaphragm blunting of the left costophrenic angle is unchanged compared to yesterday's chest radiograph. There is suggestion of periosteal reaction about the lateral seventh and are eighth ribs, for which healing fractures cannot be excluded. Dedicated rib radiographs could be useful if indicated. Marked right acromioclavicular joint region degenerative changes. High-riding right humeral head suggests chronic  rotator cuff disease. IMPRESSION: No significant change in aeration compared to the chest radiograph of 06/05/2015. Streaky opacities in the right upper lung could reflect airspace disease and/or atelectasis/scarring. Left basilar opacity obscures left hemidiaphragm and could reflect airspace disease, atelectasis, and/or effusion. Cannot exclude healing right lateral rib  fractures as described above. Electronically Signed   By: Britta Mccreedy M.D.   On: 06/07/2015 12:56   Dg Chest Port 1 View  06/05/2015  CLINICAL DATA:  Fever EXAM: PORTABLE CHEST 1 VIEW COMPARISON:  06/04/2015 FINDINGS: Cardiomegaly again noted. No pulmonary edema. Persistent streaky left perihilar atelectasis or infiltrate. New streaky atelectasis or infiltrate in right upper lobe. IMPRESSION: Persistent left perihilar atelectasis or infiltrate. New streaky atelectasis or infiltrate in right upper lobe. Cardiomegaly again noted. Electronically Signed   By: Natasha Mead M.D.   On: 06/05/2015 09:31   Dg Chest Port 1 View  06/04/2015  CLINICAL DATA:  Postop, intubated, possible aspiration EXAM: PORTABLE CHEST 1 VIEW COMPARISON:  04/17/2015 FINDINGS: Cardiomediastinal silhouette is stable. Mild hyperinflation. Stable left basilar atelectasis or scarring. There is left perihilar atelectasis or infiltrate. No pulmonary edema pre IMPRESSION: Mild hyperinflation. Cardiomegaly again noted. Left perihilar atelectasis or infiltrate. No pulmonary edema. Electronically Signed   By: Natasha Mead M.D.   On: 06/04/2015 11:03   Dg Foot Complete Right  06/02/2015  CLINICAL DATA:  Gangrene of the foot. EXAM: RIGHT FOOT COMPLETE - 3+ VIEW COMPARISON:  None. FINDINGS: There is no fracture or dislocation. No bone destruction to suggest osteomyelitis. Soft tissue swelling of the dorsum of the foot. Arterial vascular calcifications. IMPRESSION: No acute osseous abnormality.  Osteopenia. Electronically Signed   By: Francene Boyers M.D.   On: 06/02/2015 08:55     Jeoffrey Massed, Rice  [facility] Pager:[phone]  If 7PM-7AM, please contact night-coverage [email or web address] Password [email or web address] 06/09/2015, 11:37 AM   LOS: 7 days

## 2015-06-09 NOTE — Progress Notes (Signed)
[place] [facility]  [address]    , [place],   Phone:     Fax:      Clinical/Bedside Swallow Evaluation Patient Details  Name: Shannon Rice MRN: [id number] DOB: [birth year]  Today's Date: 06/09/2015 Time:  -     Past Medical History:  Past Medical History  Diagnosis Date  . Hypertension   . GERD (gastroesophageal reflux disease)   . Lupus (HCC)     skin, scleraderma  . Scleroderma (HCC)   . Arthritis     scleroderma  . Peripheral vascular disease (HCC)   . PAD (peripheral artery disease) (HCC)   . Heart murmur   . Chronic diastolic (congestive) heart failure [facility](HCC)    Past Surgical History:  Past Surgical History  Procedure Laterality Date  . Abdominal hysterectomy    . Coronary angioplasty      Dr Jenne CampusMcQueen  . Sympathectomy Right 01/27/2015    Procedure: RIGHT RADICAL ARTERY SYMPATHECTOMY, RIGHT ULNAR ARTERY SYMPATHECTOMY, DIGITAL SYMPATHECTOMY RIGHT INDEX, MIDDLE, RING SMALL;  Surgeon: Cindee SaltGary Kuzma, MD;  Location: [facility];  Service: Orthopedics;  Laterality: Right;  . Amputation Right 01/27/2015    Procedure: AMPUTATION DISTAL TIP RIGHT INDEX FINGER;  Surgeon: Cindee SaltGary Kuzma, MD;  Location: [facility];  Service: Orthopedics;  Laterality: Right;  . Peripheral vascular catheterization N/A 02/15/2015    Procedure: Lower Extremity Angiography;  Surgeon: Yates DecampJay Ganji, MD;  [facility];  Service: Cardiovascular;  Laterality: N/A;  . Peripheral vascular catheterization Left 04/12/2015    Procedure: Lower Extremity Angiography;  Surgeon: Yates DecampJay Ganji, MD;  [facility];  Service: Cardiovascular;  Laterality: Left;  . Peripheral vascular catheterization Left 04/12/2015    Procedure: Peripheral Vascular Atherectomy;  Surgeon: Yates DecampJay Ganji, MD;  [facility];  Service: Cardiovascular;  Laterality: Left;  sfa     . Amputation Left 04/20/2015    Procedure: Left Below Knee Amputation;  Surgeon: Nadara MustardMarcus  Duda V, MD;  [facility];  Service: Orthopedics;  Laterality: Left;  . Peripheral arterial stent graft Left 05/17/2015  . Peripheral vascular catheterization N/A 05/17/2015    Procedure: Lower Extremity Intervention;  Surgeon: Yates DecampJay Ganji, MD;  [facility];  Service: Cardiovascular;  Laterality: N/A;  . Amputation Right 06/04/2015    Procedure: AMPUTATION BELOW KNEE;  Surgeon: Nadara MustardMarcus Duda V, MD;  Location: [facility] OR;  Service: Orthopedics;  Laterality: Right;   HPI:    HPI: History of Present IllnessJohnetta Orson Rice is a 81 y.o. female, with lupus, scleroderma, diastolic heart failure and severe peripheral artery disease status post left AKA and has had multiple peripheral vascular catheterizations for critical limb ischemia (05/17/2015). [facility] for increasing redness and pain in the leg above her right lower extremity gangrenous toes. s/p R BKA oHistory of Present IllnessJohnetta Orson Rice is a 81 y.o. female, with lupus, scleroderma, diastolic heart failure and severe peripheral artery disease status post left AKA and has had multiple peripheral vascular catheterizations for critical limb ischemia (05/17/2015). [facility] for increasing redness and pain in the leg above her right lower extremity gangrenous toes. s/p R BKA on 11/26, likely aspiration during surgeryn 11/26, likely aspiration during surgery  Assessment/Recommendations/Treatment Plan    SLP Assessment Clinical Impression Statement (ACUTE ONLY): After repositioning pt in bed, breakfast tray was presented. Pt needed a lot of assistance in preparing food and making food  choicies. Pt had upper dentures and no bottom teeth. She had difficulty chewing toast until it was moistened. Pt had a delayed cough with one bite of toast. With this cough she was able to cough up a lot of secretions and spit out. Pt took a sip of coffee and had right anterior  spillage as well as a delayed cough. Again pt coughed up more secretions. Pt had a wet vocal quality before and after presentation of PO. Pt was unable to chew and swallow bacon. Suspect pt is experienceing a delayed swallow  with reduced oral function. It is recommended pt receive a MBS to fully evaluate safest diet. Impact on safety and function: Moderate aspiration risk  Swallow Evaluation Recommendations: MBS SLP Diet Recommendations: NPO        General  HPI: History of Present IllnessJohnetta Rice is a 81 y.o. female, with lupus, scleroderma, diastolic heart failure and severe peripheral artery disease status post left AKA and has had multiple peripheral vascular catheterizations for critical limb ischemia (05/17/2015). She presents from [facility] skilled nursing for increasing redness and pain in the leg above her right lower extremity gangrenous toes. s/p R BKA oHistory of Present IllnessJohnetta Rice is a 81 y.o. female, with lupus, scleroderma, diastolic heart failure and severe peripheral artery disease status post left AKA and has had multiple peripheral vascular catheterizations for critical limb ischemia (05/17/2015). She presents from [facility] skilled nursing for increasing redness and pain in the leg above her right lower extremity gangrenous toes. s/p R BKA on 11/26, likely aspiration during surgeryn 11/26, likely aspiration during surgery Type of Study: Bedside Swallow Evaluation Diet Prior to this Study: Regular, Thin liquids Temperature Spikes Noted: Yes Respiratory Status: Nasal cannula History of Recent Intubation: Yes Length of Intubations (days):  (for surgery) Date extubated: 06/05/15 Behavior/Cognition: Alert, Cooperative, Pleasant mood, Confused Oral Cavity Assessment: Within Functional Limits Oral Care Completed by SLP: No Vision: Functional for self-feeding Patient Positioning: Upright in bed Baseline Vocal Quality: Wet Volitional Cough:  Weak Volitional Swallow: Able to elicit  Shannon Hose Miriya Cloer, MA, CCC-SLP 06/09/2015 10:36 AM                                    Problem List Patient Active Problem List   Diagnosis Date Noted  . Gangrene of foot (HCC) 06/02/2015  . Cellulitis of right foot 06/02/2015  . Pressure ulcer 06/02/2015  . Symptomatic anemia 05/24/2015  . HLD (hyperlipidemia) 05/24/2015  . GERD (gastroesophageal reflux disease) 05/24/2015  . Arthritis 05/24/2015  . Chronic diastolic (congestive) heart failure (HCC)   . Protein-calorie malnutrition, severe (HCC) 04/19/2015  . Osteomyelitis (HCC) 04/14/2015  . HCAP (healthcare-associated pneumonia) 04/13/2015  . Herpes zoster keratoconjunctivitis 04/13/2015  . HTN (hypertension) 04/13/2015  . Constipation 04/13/2015  . Hypokalemia 04/13/2015  . Hypoxia   . Critical lower limb ischemia 04/10/2015  . Peripheral artery disease (HCC) 02/10/2015  . Finger, open wounds, complicated   . Scleroderma [facility])     Shannon Rice 06/09/2015, 10:30 AM  [place] [facility]  [address]    , [place],   Phone:     Fax:     Name: Shannon Rice MRN: [id number] Date of Birth: 09-Jul-1931

## 2015-06-09 NOTE — Consult Note (Signed)
Consultation Note Date: 06/09/2015   Patient Name: Shannon Rice  DOB: [birth year]  MRN: [id number]  Age / Sex: 81 y.o., female  PCP: Ralene Ok, MD Referring Physician: Maretta Bees, MD  Reason for Consultation: Establishing goals of care    Clinical Assessment/Narrative: Patient is a 81 year old pleasant lady with a past medical history significant for cellulitis right lower extremity, multiple wounds, history of chronic diastolic heart failure, severe protein calorie malnutrition, underlying history of peripheral arterial disease. In this hospitalization, patient is status post right transtibial amputation. Hospitalization course is also complicated by atrial fibrillation with rapid ventricular response, aspiration pneumonia. Speech therapy is following. Orthopedic service is following. Palliative consultation for goals of care discussions  Patient is awake alert frail left American lady resting in bed. She opens her eyes, she is able to answer questions asked appropriately. She complains of occasional episodic pain bilateral lower extremities. Denies any pain currently. Bedside RN states that the patient does not much care for the thickened liquids that she has been prescribed by speech therapy recommendations.  Call placed and discussed extensively with patient's daughter Ms. Christell Constant at [phone]: Discussed and introduced concept of hospice/palliative services. Discussed and recommended addition of hospice as an extra layer of support at the patient skilled nursing facility upon discharge. Discussed scope of comfort feeding recognizing the risk of aspiration that exists at all times.  Provided Ms. Moore with contact number on how to teach palliative services. Answered all of her questions to the best of my ability. Thank you for the consult.  Contacts/Participants in Discussion: Primary Decision Maker:  Patient's daughter ms moore at [phone] Relationship to Patient daughter HCPOA: yes    SUMMARY OF RECOMMENDATIONS: Agree with DO NOT RESUSCITATE, DO NOT INTUBATE Agree with SNF on discharge Recommend hospice consultation at SNF. Discussed with daughter Ms. Moore who is in agreement.  Code Status/Advance Care Planning: DNR    Code Status Orders        Start     Ordered   06/04/15 1053  Do not attempt resuscitation (DNR)   Continuous    Question Answer Comment  In the event of cardiac or respiratory ARREST Do not call a "code blue"   In the event of cardiac or respiratory ARREST Do not perform Intubation, CPR, defibrillation or ACLS   In the event of cardiac or respiratory ARREST Use medication by any route, position, wound care, and other measures to relive pain and suffering. May use oxygen, suction and manual treatment of airway obstruction as needed for comfort.      06/04/15 1052    Advance Directive Documentation        Most Recent Value   Type of Advance Directive  Out of facility DNR (pink MOST or yellow form)   Pre-existing out of facility DNR order (yellow form or pink MOST form)  Yellow form placed in chart (order not valid for inpatient use)   "MOST" Form in Place?        Other Directives:Other  Symptom Management:   Continue pain and non-pain symptom management  Palliative Prophylaxis:   Bowel Regimen  Additional Recommendations (Limitations, Scope, Preferences):  SNF with hospice    Psycho-social/Spiritual:  Support System: Fair Desire for further Chaplaincy support:no Additional Recommendations: Education on Hospice  Prognosis: < 12 months  Discharge Planning: Skilled Nursing Facility with Hospice   Chief Complaint/ Primary Diagnoses: Present on Admission:  . Gangrene of foot (HCC) . Cellulitis of right foot . Scleroderma (HCC) . Protein-calorie  malnutrition, severe (HCC) . Peripheral artery disease (HCC) . Chronic diastolic  (congestive) heart failure (HCC) . HLD (hyperlipidemia) . HTN (hypertension) . Critical lower limb ischemia  I have reviewed the medical record, interviewed the patient and family, and examined the patient. The following aspects are pertinent.  Past Medical History  Diagnosis Date  . Hypertension   . GERD (gastroesophageal reflux disease)   . Lupus (HCC)     skin, scleraderma  . Scleroderma (HCC)   . Arthritis     scleroderma  . Peripheral vascular disease (HCC)   . PAD (peripheral artery disease) (HCC)   . Heart murmur   . Chronic diastolic (congestive) heart failure [facility])    Social History   Social History  . Marital Status: Widowed    Spouse Name: N/A  . Number of Children: N/A  . Years of Education: N/A   Social History Main Topics  . Smoking status: Former Smoker -- 0.25 packs/day for 60 years    Types: Cigarettes    Quit date: 12/12/2014  . Smokeless tobacco: Never Used  . Alcohol Use: Yes     Comment: glass wine on special occasions  . Drug Use: No  . Sexual Activity: No   Other Topics Concern  . None   Social History Narrative   Family History  Problem Relation Age of Onset  . Diabetes Sister   . Diabetes Brother    Scheduled Meds: . ampicillin-sulbactam (UNASYN) IV  1.5 g Intravenous Q12H  . antiseptic oral rinse  7 mL Mouth Rinse BID  . apixaban  2.5 mg Oral BID  . diltiazem  30 mg Oral Q12H  . feeding supplement (ENSURE ENLIVE)  237 mL Oral BID BM  . feeding supplement (PRO-STAT SUGAR FREE 64)  30 mL Oral BID  . guaiFENesin  600 mg Oral BID  . morphine  15 mg Oral Q12H  . multivitamin with minerals  1 tablet Oral Daily  . pantoprazole  40 mg Oral Daily  . polyethylene glycol  17 g Oral Daily  . potassium chloride  20 mEq Oral TID  . pravastatin  80 mg Oral q1800  . pregabalin  50 mg Oral TID  . senna-docusate  2 tablet Oral QHS   Continuous Infusions: . sodium chloride Stopped (06/08/15 0522)  . sodium chloride Stopped (06/07/15 2100)    PRN Meds:.acetaminophen **OR** acetaminophen, alum & mag hydroxide-simeth, food thickener, levalbuterol, methocarbamol **OR** methocarbamol (ROBAXIN)  IV, metoCLOPramide **OR** metoCLOPramide (REGLAN) injection, morphine CONCENTRATE, ondansetron **OR** ondansetron (ZOFRAN) IV, ondansetron Medications Prior to Admission:  Prior to Admission medications   Medication Sig Start Date End Date Taking? Authorizing Provider  acetaminophen (TYLENOL) 325 MG tablet Take 2 tablets (650 mg total) by mouth every 6 (six) hours as needed for mild pain (or Fever >/= 101). 04/22/15  Yes Dawood S Elgergawy, MD  amLODipine-benazepril (LOTREL) 5-20 MG per capsule Take 1 capsule by mouth daily.   Yes Historical Provider, MD  aspirin EC 81 MG EC tablet Take 1 tablet (81 mg total) by mouth daily. 04/13/15  Yes [name], NP  clopidogrel (PLAVIX) 75 MG tablet Take 1 tablet (75 mg total) by mouth daily. 04/22/15  Yes Leana Roe Elgergawy, MD  feeding supplement, ENSURE ENLIVE, (ENSURE ENLIVE) LIQD Take 237 mLs by mouth 2 (two) times daily between meals. 05/25/15  Yes Hollice Espy, MD  morphine (MS CONTIN) 15 MG 12 hr tablet Take 1 tablet (15 mg total) by mouth every 12 (twelve) hours. 05/25/15  Yes  Hollice EspySendil K Krishnan, MD  omeprazole (PRILOSEC) 20 MG capsule Take 1 capsule (20 mg total) by mouth daily. 05/25/15  Yes Hollice EspySendil K Krishnan, MD  ondansetron (ZOFRAN ODT) 4 MG disintegrating tablet Take 1 tablet (4 mg total) by mouth every 8 (eight) hours as needed for nausea or vomiting. 04/13/15  Yes Marcy SalvoBridgette Allison, NP  oxyCODONE (ROXICODONE) 5 MG immediate release tablet Take 1-2 tablets (5-10 mg total) by mouth every 4 (four) hours as needed for severe pain. 05/05/15  Yes Tiffany L Reed, DO  Pitavastatin Calcium (LIVALO) 2 MG TABS Take 2 mg by mouth 2 (two) times daily.   Yes Historical Provider, MD  polyethylene glycol (MIRALAX / GLYCOLAX) packet Take 17 g by mouth daily as needed for moderate constipation. 04/22/15   Yes Starleen Armsawood S Elgergawy, MD  pregabalin (LYRICA) 50 MG capsule Take 1 capsule (50 mg total) by mouth 3 (three) times daily. 04/27/15  Yes Sharon SellerJessica K Eubanks, NP   No Known Allergies  Review of Systems Positive for weakness, episodic pains bilateral lower extremities. None reported currently. Physical Exam Weak frail elderly African-American lady resting in bed Bilateral lower extremity amputations Frail Diminished breath sounds S1-S2 Awake alert answers questions appropriately. Vital Signs: BP 119/47 mmHg  Pulse 90  Temp(Src) 97.8 F (36.6 C) (Oral)  Resp 20  Ht 5\' 3"  (1.6 m)  Wt 34.428 kg (75 lb 14.4 oz)  BMI 13.45 kg/m2  SpO2 99%  SpO2: SpO2: 99 % O2 Device:SpO2: 99 % O2 Flow Rate: .O2 Flow Rate (L/min): 4 L/min  IO: Intake/output summary:  Intake/Output Summary (Last 24 hours) at 06/09/15 1745 Last data filed at 06/09/15 0900  Gross per 24 hour  Intake    530 ml  Output      0 ml  Net    530 ml    LBM: Last BM Date:  (PTA) Baseline Weight: Weight: 41.8 kg (92 lb 2.4 oz) Most recent weight: Weight: 34.428 kg (75 lb 14.4 oz)      Palliative Assessment/Data:  Flowsheet Rows        Most Recent Value   Intake Tab    Referral Department  Hospitalist   Unit at Time of Referral  Med/Surg Unit   Palliative Care Primary Diagnosis  Sepsis/Infectious Disease   Date Notified  06/07/15   Palliative Care Type  New Palliative care   Reason for referral  Counsel Regarding Hospice   Date of Admission  06/02/15   # of days IP prior to Palliative referral  5   Clinical Assessment    Palliative Performance Scale Score  20%   Psychosocial & Spiritual Assessment    Palliative Care Outcomes    Palliative Care Outcomes  Clarified goals of care   Palliative Care follow-up planned  Yes, Facility      Additional Data Reviewed:  CBC:    Component Value Date/Time   WBC 7.7 06/08/2015 0215   HGB 9.3* 06/08/2015 0215   HCT 30.9* 06/08/2015 0215   PLT 239 06/08/2015 0215   MCV  95.4 06/08/2015 0215   NEUTROABS 3.5 06/02/2015 0620   LYMPHSABS 0.9 06/02/2015 0620   MONOABS 0.3 06/02/2015 0620   EOSABS 0.1 06/02/2015 0620   BASOSABS 0.0 06/02/2015 0620   Comprehensive Metabolic Panel:    Component Value Date/Time   NA 139 06/08/2015 0215   K 3.6 06/08/2015 0215   CL 97* 06/08/2015 0215   CO2 35* 06/08/2015 0215   BUN 8 06/08/2015 0215   CREATININE 0.43* 06/08/2015  0215   GLUCOSE 106* 06/08/2015 0215   CALCIUM 8.4* 06/08/2015 0215   AST 29 06/08/2015 0215   ALT 12* 06/08/2015 0215   ALKPHOS 60 06/08/2015 0215   BILITOT 0.6 06/08/2015 0215   PROT 7.2 06/08/2015 0215   ALBUMIN 2.5* 06/08/2015 0215     Time In: 1600 Time Out: 1700 Time Total: 60 Greater than 50%  of this time was spent counseling and coordinating care related to the above assessment and plan.  Signed by: Rosalin Hawking, MD [id number] Rosalin Hawking, MD  06/09/2015, 5:45 PM  Please contact Palliative Medicine Team phone at [phone] for questions and concerns.

## 2015-06-09 NOTE — Progress Notes (Signed)
MBSS complete. Full report located under chart review in imaging section.  Kaytee Taliercio MA, CCC-SLP [phone]   

## 2015-06-09 NOTE — Progress Notes (Signed)
Patient HR 140s-150s in Afib. PO Cardizem given. Notified K. Schorr. Will continue to monitor. Patient in bed resting at this time.

## 2015-06-09 NOTE — Clinical Social Work Note (Signed)
Clinical Social Worker continuing to follow patient and family for support and discharge planning needs.  Patient has received insurance authorization from [facility] (authorization number [id number] [employer]).  Authorization is valid for 48 hours.  Facility has also received authorization and is agreeable with patient discharge once medically stable.  CSW remains available for support and to facilitate patient discharge needs.  Macario GoldsJesse Amaya Blakeman, [place] [phone]

## 2015-06-10 DIAGNOSIS — I10 Essential (primary) hypertension: Secondary | ICD-10-CM

## 2015-06-10 MED ORDER — POLYETHYLENE GLYCOL 3350 17 G PO PACK
17.0000 g | PACK | Freq: Two times a day (BID) | ORAL | Status: DC
  Administered 2015-06-10: 17 g via ORAL
  Filled 2015-06-10: qty 1

## 2015-06-10 MED ORDER — AMOXICILLIN-POT CLAVULANATE 250-62.5 MG/5ML PO SUSR
500.0000 mg | Freq: Two times a day (BID) | ORAL | Status: DC
  Administered 2015-06-10: 500 mg via ORAL
  Filled 2015-06-10 (×2): qty 10

## 2015-06-10 MED ORDER — APIXABAN 2.5 MG PO TABS: 2.5000 mg | ORAL_TABLET | Freq: Two times a day (BID) | ORAL | Status: AC

## 2015-06-10 MED ORDER — SENNOSIDES-DOCUSATE SODIUM 8.6-50 MG PO TABS: 2.0000 | ORAL_TABLET | Freq: Every day | ORAL | Status: AC

## 2015-06-10 MED ORDER — MORPHINE SULFATE (CONCENTRATE) 10 MG/0.5ML PO SOLN: 10.0000 mg | ORAL | Status: DC | PRN

## 2015-06-10 MED ORDER — MORPHINE SULFATE ER 15 MG PO TBCR
30.0000 mg | EXTENDED_RELEASE_TABLET | Freq: Two times a day (BID) | ORAL | Status: DC
  Administered 2015-06-10: 30 mg via ORAL
  Filled 2015-06-10: qty 2

## 2015-06-10 MED ORDER — POTASSIUM CHLORIDE 20 MEQ/15ML (10%) PO SOLN: 20.0000 meq | Freq: Every day | ORAL | Status: AC

## 2015-06-10 MED ORDER — LEVALBUTEROL HCL 0.63 MG/3ML IN NEBU: 0.6300 mg | INHALATION_SOLUTION | Freq: Four times a day (QID) | RESPIRATORY_TRACT | Status: AC | PRN

## 2015-06-10 MED ORDER — PRO-STAT SUGAR FREE PO LIQD: 30.0000 mL | Freq: Two times a day (BID) | ORAL | Status: AC

## 2015-06-10 MED ORDER — MORPHINE SULFATE ER 30 MG PO TBCR: 30.0000 mg | EXTENDED_RELEASE_TABLET | Freq: Two times a day (BID) | ORAL | Status: DC

## 2015-06-10 MED ORDER — AMOXICILLIN-POT CLAVULANATE 250-62.5 MG/5ML PO SUSR: 500.0000 mg | Freq: Two times a day (BID) | ORAL | Status: DC

## 2015-06-10 MED ORDER — DILTIAZEM HCL 30 MG PO TABS: 30.0000 mg | ORAL_TABLET | Freq: Three times a day (TID) | ORAL | Status: AC

## 2015-06-10 MED ORDER — POLYETHYLENE GLYCOL 3350 17 G PO PACK: 17.0000 g | PACK | Freq: Two times a day (BID) | ORAL | Status: AC

## 2015-06-10 MED ORDER — DILTIAZEM HCL 30 MG PO TABS: 30.0000 mg | ORAL_TABLET | Freq: Three times a day (TID) | ORAL | Status: DC

## 2015-06-10 NOTE — Progress Notes (Signed)
Pt discharged to skilled nursing facility via EMS, condition stable, [facility]. Raymon MuttonGwen Clair Bardwell RN

## 2015-06-10 NOTE — Progress Notes (Signed)
Patient on room air

## 2015-06-10 NOTE — Discharge Summary (Addendum)
PATIENT DETAILS Name: Shannon Rice Age: 81 y.o. Sex: female Date of Birth: 20-Dec-1930 MRN: [id number]. Admitting Physician: Elmarie Shiley, MD RWE:[id number],QPY, MD  Admit Date: 06/02/2015 Discharge date: 06/10/2015  Recommendations for Outpatient Follow-up:  1. Ensure follow-up by hospice while at SNF  2. Focus mostly on comfort-but family wants short-term antibiotics and IV fluids if indicated (see MOST form)  PRIMARY DISCHARGE DIAGNOSIS:  Principal Problem:   Cellulitis of right foot Active Problems:   Scleroderma ([place])   Peripheral artery disease (HCC)   Critical lower limb ischemia   HTN (hypertension)   Protein-calorie malnutrition, severe (HCC)   HLD (hyperlipidemia)   Chronic diastolic (congestive) heart failure (HCC)   Gangrene of foot (HCC)   Pressure ulcer   Encounter for palliative care   Goals of care, counseling/discussion      PAST MEDICAL HISTORY: Past Medical History  Diagnosis Date  . Hypertension   . GERD (gastroesophageal reflux disease)   . Lupus (HCC)     skin, scleraderma  . Scleroderma ([place])   . Arthritis     scleroderma  . Peripheral vascular disease ([place])   . PAD (peripheral artery disease) ([place])   . Heart murmur   . Chronic diastolic (congestive) heart failure (HCC)     DISCHARGE MEDICATIONS: Current Discharge Medication List    START taking these medications   Details  Amino Acids-Protein Hydrolys (FEEDING SUPPLEMENT, PRO-STAT SUGAR FREE 64,) LIQD Take 30 mLs by mouth 2 (two) times daily.    amoxicillin-clavulanate (AUGMENTIN) 250-62.5 MG/5ML suspension Take 10 mLs (500 mg total) by mouth every 12 (twelve) hours. 3 more days from 06/10/15    apixaban (ELIQUIS) 2.5 MG TABS tablet Take 1 tablet (2.5 mg total) by mouth 2 (two) times daily.    diltiazem (CARDIZEM) 30 MG tablet Take 1 tablet (30 mg total) by mouth every 8 (eight) hours.    levalbuterol (XOPENEX) 0.63 MG/3ML nebulizer solution Take 3 mLs (0.63 mg total) by  nebulization every 6 (six) hours as needed for wheezing or shortness of breath.    Morphine Sulfate (MORPHINE CONCENTRATE) 10 MG/0.5ML SOLN concentrated solution Take 0.5 mLs (10 mg total) by mouth every 4 (four) hours as needed for moderate pain, severe pain or anxiety (Comfort). Qty: 30 mL, Refills: 0    potassium chloride 20 MEQ/15ML (10%) SOLN Take 15 mLs (20 mEq total) by mouth daily.    senna-docusate (SENOKOT-S) 8.6-50 MG tablet Take 2 tablets by mouth at bedtime.      CONTINUE these medications which have CHANGED   Details  morphine (MS CONTIN) 30 MG 12 hr tablet Take 1 tablet (30 mg total) by mouth every 12 (twelve) hours. Qty: 20 tablet, Refills: 0    polyethylene glycol (MIRALAX / GLYCOLAX) packet Take 17 g by mouth 2 (two) times daily.      CONTINUE these medications which have NOT CHANGED   Details  acetaminophen (TYLENOL) 325 MG tablet Take 2 tablets (650 mg total) by mouth every 6 (six) hours as needed for mild pain (or Fever >/= 101).    feeding supplement, ENSURE ENLIVE, (ENSURE ENLIVE) LIQD Take 237 mLs by mouth 2 (two) times daily between meals. Qty: 237 mL, Refills: 12    omeprazole (PRILOSEC) 20 MG capsule Take 1 capsule (20 mg total) by mouth daily. Qty: 30 capsule, Refills: 2    ondansetron (ZOFRAN ODT) 4 MG disintegrating tablet Take 1 tablet (4 mg total) by mouth every 8 (eight) hours as needed for nausea or vomiting.  Qty: 20 tablet, Refills: 0    Pitavastatin Calcium (LIVALO) 2 MG TABS Take 2 mg by mouth 2 (two) times daily.    pregabalin (LYRICA) 50 MG capsule Take 1 capsule (50 mg total) by mouth 3 (three) times daily.      STOP taking these medications     amLODipine-benazepril (LOTREL) 5-20 MG per capsule      aspirin EC 81 MG EC tablet      clopidogrel (PLAVIX) 75 MG tablet      oxyCODONE (ROXICODONE) 5 MG immediate release tablet         ALLERGIES:  No Known Allergies  BRIEF HPI:  See H&P, Labs, Consult and Test reports for all  details in brief, patient is a 81 y.o. female, with lupus, scleroderma, diastolic heart failure and severe peripheral artery disease status post left AKA and has had multiple peripheral vascular catheterization catheterizations for critical limb ischemia -presented with right leg gangrene.  CONSULTATIONS:   cardiology and orthopedic surgery  PERTINENT RADIOLOGIC STUDIES: Ct Abdomen Pelvis Wo Contrast  05/25/2015  CLINICAL DATA:  Acute onset of dizziness. Anemia. Assess for retroperitoneal bleed. Initial encounter. EXAM: CT ABDOMEN AND PELVIS WITHOUT CONTRAST TECHNIQUE: Multidetector CT imaging of the abdomen and pelvis was performed following the standard protocol without IV contrast. COMPARISON:  CT of the abdomen and pelvis performed 08/24/2010 FINDINGS: There is no evidence of retroperitoneal hemorrhage. Mild diffuse soft tissue edema is noted about the abdomen and pelvis, likely reflecting mild anasarca. Trace bilateral pleural fluid is noted, with bibasilar atelectasis. Mild biatrial enlargement is noted. Diffuse coronary artery calcifications are seen. The liver and spleen are unremarkable in appearance. The gallbladder is within normal limits. The pancreas and adrenal glands are unremarkable. A few small hyperdense foci within the kidneys may reflect small hyperdense cysts. There is no evidence of hydronephrosis. A 2 mm stone is noted at the upper pole of the right kidney. No obstructing ureteral stones are seen. No perinephric stranding is appreciated. No free fluid is identified. The small bowel is unremarkable in appearance. The stomach is within normal limits. No acute vascular abnormalities are seen. Diffuse calcification is noted along the abdominal aorta and its branches. The appendix is normal in caliber, without evidence of appendicitis. The colon is unremarkable in appearance. The bladder is significantly distended and grossly unremarkable. The patient is status post hysterectomy. No  suspicious adnexal masses are seen. No inguinal lymphadenopathy is seen. No acute osseous abnormalities are identified. A chronic right-sided pars defect is noted at L5. IMPRESSION: 1. No evidence of acute hemorrhage. 2. Mild diffuse soft tissue edema about the abdomen and pelvis, likely reflecting mild anasarca. 3. Trace bilateral pleural fluid, with bibasilar atelectasis. 4. Mild biatrial enlargement noted. 5. Diffuse coronary artery calcifications seen. 6. Diffuse calcification along the abdominal aorta and branches. 7. Few small hyperdense foci within the kidneys may reflect small hyperdense cysts. 2 mm nonobstructing stone at the upper pole of the right kidney. 8. Chronic right-sided pars defect at L5. Electronically Signed   By: Garald Balding M.D.   On: 05/25/2015 02:55   Dg Chest Port 1 View  06/09/2015  CLINICAL DATA:  Patient with chest congestion. History of hypertension. EXAM: PORTABLE CHEST 1 VIEW COMPARISON:  Chest radiograph 06/07/2015. FINDINGS: Monitoring leads overlie the patient. Stable enlarged cardiac and mediastinal contours. Unchanged retrocardiac pulmonary consolidation. Persistent heterogeneous opacities right upper lung. No definite pleural effusion or pneumothorax. IMPRESSION: Unchanged retrocardiac consolidation and right upper lung heterogeneous opacities which may represent  atelectasis, infection and/or chronic pulmonary changes. Electronically Signed   By: Lovey Newcomer M.D.   On: 06/09/2015 09:53   Dg Chest Port 1 View  06/07/2015  CLINICAL DATA:  Shortness of breath.  Chest pain. EXAM: PORTABLE CHEST 1 VIEW COMPARISON:  06/05/2015, 06/04/2015, 04/17/2015, 04/13/2015 FINDINGS: Stable cardiomegaly. Thoracic aorta atherosclerotic calcification noted. The central bronchial airways appear thickened. There is streaky opacity in the right upper lobe, unchanged. Obscuration of the left hemidiaphragm blunting of the left costophrenic angle is unchanged compared to yesterday's chest  radiograph. There is suggestion of periosteal reaction about the lateral seventh and are eighth ribs, for which healing fractures cannot be excluded. Dedicated rib radiographs could be useful if indicated. Marked right acromioclavicular joint region degenerative changes. High-riding right humeral head suggests chronic rotator cuff disease. IMPRESSION: No significant change in aeration compared to the chest radiograph of 06/05/2015. Streaky opacities in the right upper lung could reflect airspace disease and/or atelectasis/scarring. Left basilar opacity obscures left hemidiaphragm and could reflect airspace disease, atelectasis, and/or effusion. Cannot exclude healing right lateral rib fractures as described above. Electronically Signed   By: Curlene Dolphin M.D.   On: 06/07/2015 12:56   Dg Chest Port 1 View  06/05/2015  CLINICAL DATA:  Fever EXAM: PORTABLE CHEST 1 VIEW COMPARISON:  06/04/2015 FINDINGS: Cardiomegaly again noted. No pulmonary edema. Persistent streaky left perihilar atelectasis or infiltrate. New streaky atelectasis or infiltrate in right upper lobe. IMPRESSION: Persistent left perihilar atelectasis or infiltrate. New streaky atelectasis or infiltrate in right upper lobe. Cardiomegaly again noted. Electronically Signed   By: Lahoma Crocker M.D.   On: 06/05/2015 09:31   Dg Chest Port 1 View  06/04/2015  CLINICAL DATA:  Postop, intubated, possible aspiration EXAM: PORTABLE CHEST 1 VIEW COMPARISON:  04/17/2015 FINDINGS: Cardiomediastinal silhouette is stable. Mild hyperinflation. Stable left basilar atelectasis or scarring. There is left perihilar atelectasis or infiltrate. No pulmonary edema pre IMPRESSION: Mild hyperinflation. Cardiomegaly again noted. Left perihilar atelectasis or infiltrate. No pulmonary edema. Electronically Signed   By: Lahoma Crocker M.D.   On: 06/04/2015 11:03   Dg Foot Complete Right  06/02/2015  CLINICAL DATA:  Gangrene of the foot. EXAM: RIGHT FOOT COMPLETE - 3+ VIEW  COMPARISON:  None. FINDINGS: There is no fracture or dislocation. No bone destruction to suggest osteomyelitis. Soft tissue swelling of the dorsum of the foot. Arterial vascular calcifications. IMPRESSION: No acute osseous abnormality.  Osteopenia. Electronically Signed   By: Lorriane Shire M.D.   On: 06/02/2015 08:55   Dg Swallowing Func-speech Pathology  06/09/2015  Objective Swallowing Evaluation:   MBS Patient Details Name: JENNEY BRESTER MRN: [id number] Date of Birth: Jan 25, 1931 Today's Date: 06/09/2015 Time: SLP Start Time (ACUTE ONLY): 1150-SLP Stop Time (ACUTE ONLY): 1212 SLP Time Calculation (min) (ACUTE ONLY): 22 min Past Medical History: Past Medical History Diagnosis Date . Hypertension  . GERD (gastroesophageal reflux disease)  . Lupus (HCC)    skin, scleraderma . Scleroderma ([place])  . Arthritis    scleroderma . Peripheral vascular disease ([place])  . PAD (peripheral artery disease) ([place])  . Heart murmur  . Chronic diastolic (congestive) heart failure [facility])  Past Surgical History: Past Surgical History Procedure Laterality Date . Abdominal hysterectomy   . Coronary angioplasty     Dr Tami Ribas . Sympathectomy Right 01/27/2015   Procedure: RIGHT RADICAL ARTERY SYMPATHECTOMY, RIGHT ULNAR ARTERY SYMPATHECTOMY, DIGITAL SYMPATHECTOMY RIGHT INDEX, MIDDLE, RING SMALL;  Surgeon: Daryll Brod, MD;  Location: [place];  Service: Orthopedics;  Laterality: Right; . Amputation Right 01/27/2015   Procedure: AMPUTATION DISTAL TIP RIGHT INDEX FINGER;  Surgeon: Daryll Brod, MD;  Location: [place];  Service: Orthopedics;  Laterality: Right; . Peripheral vascular catheterization N/A 02/15/2015   Procedure: Lower Extremity Angiography;  Surgeon: Adrian Prows, MD;  Location: [place] [facility];  Service: Cardiovascular;  Laterality: N/A; . Peripheral vascular catheterization Left 04/12/2015   Procedure: Lower Extremity Angiography;  Surgeon: Adrian Prows, MD;  Location: [place] [facility];  Service:  Cardiovascular;  Laterality: Left; . Peripheral vascular catheterization Left 04/12/2015   Procedure: Peripheral Vascular Atherectomy;  Surgeon: Adrian Prows, MD;  Location: [place] [facility];  Service: Cardiovascular;  Laterality: Left;  sfa    . Amputation Left 04/20/2015   Procedure: Left Below Knee Amputation;  Surgeon: Newt Minion, MD;  Location: [place];  Service: Orthopedics;  Laterality: Left; . Peripheral arterial stent graft Left 05/17/2015 . Peripheral vascular catheterization N/A 05/17/2015   Procedure: Lower Extremity Intervention;  Surgeon: Adrian Prows, MD;  Location: [place] [facility];  Service: Cardiovascular;  Laterality: N/A; . Amputation Right 06/04/2015   Procedure: AMPUTATION BELOW KNEE;  Surgeon: Newt Minion, MD;  Location: [place];  Service: Orthopedics;  Laterality: Right; HPI: History of Present IllnessJohnetta Rice is a 81 y.o. female, with lupus, scleroderma, diastolic heart failure and severe peripheral artery disease status post left AKA and has had multiple peripheral vascular catheterizations for critical limb ischemia (05/17/2015). She presents from [place] for increasing redness and pain in the leg above her right lower extremity gangrenous toes. s/p R BKA oHistory of Present IllnessJohnetta Rice is a 81 y.o. female, with lupus, scleroderma, diastolic heart failure and severe peripheral artery disease status post left AKA and has had multiple peripheral vascular catheterizations for critical limb ischemia (05/17/2015). She presents from [place] for increasing redness and pain in the leg above her right lower extremity gangrenous toes. s/p R BKA on 11/26, likely aspiration during surgeryn 11/26, likely aspiration during surgery Subjective: Pt lying on side in asleep when entering room. Pt did not response verbally when greeted. Assessment / Plan / Recommendation CHL IP CLINICAL IMPRESSIONS 06/09/2015 Therapy Diagnosis Moderate  pharyngeal phase dysphagia;Severe cervical esophageal phase dysphagia;Suspected primary esophageal dysphagia Clinical Impression Patient presents with a mulit-factorial pharyngeal and esophageal dysphagia, complicated by acute deconditioning. Patient with relatively timely swallow initiation however with moderate-severe vallecular and moderate pyriform sinus residuals post swallow due to a combination of base of tongue, laryngeal, and pharyngeal weakness as well as what this SLP suspects is a primary esophageal dysphagia characterized by large osteophytes at C3-7 and a large Zenker's diverticulum confirmed by Dr. [place] Pink during study. Cervical and esophageal deficits result in severe residuals immediately below the UES with frequent retrograde flow of bolus into the pyriform sinuses and pressure changes during the swallow impacting full laryngeal closure. Frank penetration and aspiration of thin liquids noted with delayed, weak, congested cough response. Attempted various head postures in attempts to aid in clearance of diverticulum without success. Pureed solids and nectar thick liquids via controlled boluses (tsp) consumed without frank penetration or aspiration however patient remains at high risk given severity of residuals and general deconditioning with likely fluctuating strength. Discussed with MD. Will initiate a conservative diet and f/u closely. Suspect that patient previously compensating for chronic impairments and now unable with loss of functional reserve.  Impact on safety and function Severe aspiration risk   CHL IP TREATMENT RECOMMENDATION 06/09/2015  Treatment Recommendations Therapy as outlined in treatment plan below   Prognosis 06/09/2015 Prognosis for Safe Diet Advancement Fair Barriers to Reach Goals Severity of deficits Barriers/Prognosis Comment -- CHL IP DIET RECOMMENDATION 06/09/2015 SLP Diet Recommendations Dysphagia 1 (Puree) solids;Nectar thick liquid Liquid Administration via Spoon  Medication Administration Crushed with puree Compensations Slow rate;Small sips/bites;Follow solids with liquid;Multiple dry swallows after each bite/sip Postural Changes Seated upright at 90 degrees;Remain semi-upright after after feeds/meals (Comment)   CHL IP OTHER RECOMMENDATIONS 06/09/2015 Recommended Consults -- Oral Care Recommendations Oral care BID Other Recommendations Order thickener from pharmacy;Prohibited food (jello, ice cream, thin soups);Remove water pitcher   CHL IP FOLLOW UP RECOMMENDATIONS 06/09/2015 Follow up Recommendations Skilled Nursing facility   [facility] IP FREQUENCY AND DURATION 06/09/2015 Speech Therapy Frequency (ACUTE ONLY) min 3x week Treatment Duration 2 weeks      CHL IP ORAL PHASE 06/09/2015 Oral Phase WFL Oral - Pudding Teaspoon -- Oral - Pudding Cup -- Oral - Honey Teaspoon -- Oral - Honey Cup -- Oral - Nectar Teaspoon -- Oral - Nectar Cup -- Oral - Nectar Straw -- Oral - Thin Teaspoon -- Oral - Thin Cup -- Oral - Thin Straw -- Oral - Puree -- Oral - Mech Soft -- Oral - Regular -- Oral - Multi-Consistency -- Oral - Pill -- Oral Phase - Comment --  CHL IP PHARYNGEAL PHASE 06/09/2015 Pharyngeal Phase Impaired Pharyngeal- Pudding Teaspoon -- Pharyngeal -- Pharyngeal- Pudding Cup -- Pharyngeal -- Pharyngeal- Honey Teaspoon -- Pharyngeal -- Pharyngeal- Honey Cup -- Pharyngeal -- Pharyngeal- Nectar Teaspoon Reduced airway/laryngeal closure;Reduced tongue base retraction;Penetration/Aspiration during swallow;Pharyngeal residue - valleculae;Pharyngeal residue - pyriform;Pharyngeal residue - cp segment Pharyngeal Material enters airway, remains ABOVE vocal cords then ejected out Pharyngeal- Nectar Cup Reduced airway/laryngeal closure;Reduced tongue base retraction;Pharyngeal residue - valleculae;Pharyngeal residue - pyriform;Pharyngeal residue - cp segment Pharyngeal Material does not enter airway Pharyngeal- Nectar Straw -- Pharyngeal -- Pharyngeal- Thin Teaspoon -- Pharyngeal -- Pharyngeal- Thin  Cup Reduced airway/laryngeal closure;Reduced tongue base retraction;Penetration/Aspiration before swallow;Penetration/Aspiration during swallow;Penetration/Apiration after swallow;Moderate aspiration;Pharyngeal residue - valleculae;Pharyngeal residue - pyriform;Pharyngeal residue - cp segment Pharyngeal Material enters airway, passes BELOW cords and not ejected out despite cough attempt by patient Pharyngeal- Thin Straw -- Pharyngeal -- Pharyngeal- Puree Delayed swallow initiation-vallecula;Reduced tongue base retraction;Pharyngeal residue - valleculae;Pharyngeal residue - pyriform;Pharyngeal residue - cp segment Pharyngeal -- Pharyngeal- Mechanical Soft -- Pharyngeal -- Pharyngeal- Regular -- Pharyngeal -- Pharyngeal- Multi-consistency -- Pharyngeal -- Pharyngeal- Pill -- Pharyngeal -- Pharyngeal Comment --  CHL IP CERVICAL ESOPHAGEAL PHASE 06/09/2015 Cervical Esophageal Phase Impaired Pudding Teaspoon -- Pudding Cup -- Honey Teaspoon -- Honey Cup -- Nectar Teaspoon -- Nectar Cup -- Nectar Straw -- Thin Teaspoon -- Thin Cup -- Thin Straw -- Puree -- Mechanical Soft -- Regular -- Multi-consistency -- Pill -- Cervical Esophageal Comment see clinical impression statement Gabriel Rainwater MA, CCC-SLP [phone] McCoy Leah Meryl 06/09/2015, 3:10 PM                PERTINENT LAB RESULTS: CBC:  Recent Labs  06/08/15 0215  WBC 7.7  HGB 9.3*  HCT 30.9*  PLT 239   CMET CMP     Component Value Date/Time   NA 139 06/08/2015 0215   K 3.6 06/08/2015 0215   CL 97* 06/08/2015 0215   CO2 35* 06/08/2015 0215   GLUCOSE 106* 06/08/2015 0215   BUN 8 06/08/2015 0215   CREATININE 0.43* 06/08/2015 0215   CALCIUM 8.4* 06/08/2015 0215   PROT 7.2 06/08/2015 0215   ALBUMIN  2.5* 06/08/2015 0215   AST 29 06/08/2015 0215   ALT 12* 06/08/2015 0215   ALKPHOS 60 06/08/2015 0215   BILITOT 0.6 06/08/2015 0215   GFRNONAA >60 06/08/2015 0215   GFRAA >60 06/08/2015 0215    GFR Estimated Creatinine Clearance: 30.4 mL/min  (by C-G formula based on Cr of 0.43). No results for input(s): LIPASE, AMYLASE in the last 72 hours. No results for input(s): CKTOTAL, CKMB, CKMBINDEX, TROPONINI in the last 72 hours. Invalid input(s): POCBNP No results for input(s): DDIMER in the last 72 hours. No results for input(s): HGBA1C in the last 72 hours. No results for input(s): CHOL, HDL, LDLCALC, TRIG, CHOLHDL, LDLDIRECT in the last 72 hours. No results for input(s): TSH, T4TOTAL, T3FREE, THYROIDAB in the last 72 hours.  Invalid input(s): FREET3 No results for input(s): VITAMINB12, FOLATE, FERRITIN, TIBC, IRON, RETICCTPCT in the last 72 hours. Coags: No results for input(s): INR in the last 72 hours.  Invalid input(s): PT Microbiology: Recent Results (from the past 240 hour(s))  MRSA PCR Screening     Status: None   Collection Time: 06/02/15 12:00 PM  Result Value Ref Range Status   MRSA by PCR NEGATIVE NEGATIVE Final    Comment:        The GeneXpert MRSA Assay (FDA approved for NASAL specimens only), is one component of a comprehensive MRSA colonization surveillance program. It is not intended to diagnose MRSA infection nor to guide or monitor treatment for MRSA infections.   Surgical PCR screen     Status: Abnormal   Collection Time: 06/03/15  6:12 PM  Result Value Ref Range Status   MRSA, PCR NEGATIVE NEGATIVE Final   Staphylococcus aureus POSITIVE (A) NEGATIVE Final    Comment:        The Xpert SA Assay (FDA approved for NASAL specimens in patients over 80 years of age), is one component of a comprehensive surveillance program.  Test performance has been validated by [facility] for patients greater than or equal to 32 year old. It is not intended to diagnose infection nor to guide or monitor treatment.   Urine culture     Status: None   Collection Time: 06/03/15  8:22 PM  Result Value Ref Range Status   Specimen Description URINE, RANDOM  Final   Special Requests NONE  Final   Culture 40,000  COLONIES/ml ESCHERICHIA COLI  Final   Report Status 06/05/2015 FINAL  Final   Organism ID, Bacteria ESCHERICHIA COLI  Final      Susceptibility   Escherichia coli - MIC*    AMPICILLIN <=2 SENSITIVE Sensitive     CEFAZOLIN <=4 SENSITIVE Sensitive     CEFTRIAXONE <=1 SENSITIVE Sensitive     CIPROFLOXACIN <=0.25 SENSITIVE Sensitive     GENTAMICIN <=1 SENSITIVE Sensitive     IMIPENEM <=0.25 SENSITIVE Sensitive     NITROFURANTOIN <=16 SENSITIVE Sensitive     TRIMETH/SULFA <=20 SENSITIVE Sensitive     AMPICILLIN/SULBACTAM <=2 SENSITIVE Sensitive     PIP/TAZO <=4 SENSITIVE Sensitive     * 40,000 COLONIES/ml ESCHERICHIA COLI     BRIEF HOSPITAL COURSE:  Critical ischemia/gangrene to right foot: Secondary to underlying peripheral vascular disease, underwent transtibial amputation on 11/26.Previously, she is status post atherectomy and drug coated balloon angioplasty of the right external iliac and right common femoral and right superficial femoral artery during her last admission. Has developed phantom limb pain-this M.D. discussed with patient's daughter-focus mostly on comfort-slowly increase narcotics as tolerated. Ensure follow-up with palliative care/hospice  at SNF.  Active Problems: A. fib RVR: Back in sinus rhythm with intermittent spurts of A. fib RVR. Increase Cardizem to 30 mg 3 times a day, continue Eliquis,CHA2DS2-VASCScore 5. If continues to deteriorate-would stop anticoagulation in the near future.  Aspiration pneumonitis: Suspected aspiration during surgery, managed with Unasyn while hospitalized. Will transition to Augmentin for a few more days on discharge (see medication list). Underwent speech therapy evaluation, appears to have cervical osteophytes and Zenker's diverticulum. Recommendations from speech therapy for a dysphagia 1 diet. This M.D. spoke with patient's daughter over the phone-aspiration risks discussed-family okay with proceeding with dysphagia 1 diet. If improves,  suggest repeat swallow evaluation while at SNF-otherwise may need to focus on comfort and start comfort feedings.  Acute on chronic diastolic heart failure: EF 60-65% via TTE on October 16, mild decompensation during this hospital stay-treated with periodic doses of Lasix. Appears compensated on discharge, we will not start Lasix on discharge. Follow volume status/weight and can dose Lasix as needed.  Acute hypoxic respiratory failure: Secondary to pneumonia/CHF. Although much improved-seems to pool secretions-and will require oxygen to keep O2 saturations above 90-and also for comfort. Titrate off oxygen as tolerated while at staff  Severe peripheral artery disease: Unfortunately despite vascular catheterization and atherectomy/balloon angioplasty-she is status post left BKA on 04/20/15, and underwent right BKA on 11/26. Now off all antiplatelet agents-as on anticoagulation.  Anemia: Likely secondary to acute on chronic illness: Follow closely. Hemoglobin stable for outpatient follow-up.  Chronic pain syndrome: Continue MS Contin, Lyrica and prn Roxanol  Dyslipidemia: Continue statin  GERD: Continue PPI  Stage 2 pressure injury to left buttock/sacrum:Present on admission, consulted wound care-recommendations were to continue soft silicone foam to protect and insulate the area  Protein-calorie malnutrition, severe: Continue supplements  History of scleroderma  Failure to thrive syndrome/severe deconditioning: Very poor overall prognoses, family aware. Unfortunately appears very frail-now with bilateral BKA's-bedbound-high risk of aspiration-ongoing aspiration pneumonitis. Palliative care consulted-recommendations were for hospice/palliative follow-up at SNF. Family aware that may need to transition to comfort measures soon if no further improvement. Unfortunately-patient's improvement has plateaued and patient has met maximum benefit from this hospital stay-suspect stable for further monitoring  in a skilled nursing facility.  Note-patient is a DO NOT RESUSCITATE-family desires to focus mostly on comfort-wants to keep patient in skilled nursing facility as much as possible-family okay with short-term IV fluids and antibiotics. MOST form filled by this MD with daughter (please see for details)  TODAY-DAY OF DISCHARGE:  Subjective:   Rae Halsted today remains stable-she appears pretty weak and deconditioned. She is alert-claims to have pain in her legs. Her voice remains very soft  Objective:   Blood pressure 102/48, pulse 102, temperature 97.2 F (36.2 C), temperature source Oral, resp. rate 20, height '5\' 3"'  (1.6 m), weight 35.517 kg (78 lb 4.8 oz), SpO2 99 %.  Intake/Output Summary (Last 24 hours) at 06/10/15 1153 Last data filed at 06/10/15 0600  Gross per 24 hour  Intake    110 ml  Output      0 ml  Net    110 ml   Filed Weights   06/08/15 0300 06/09/15 0502 06/10/15 0506  Weight: 35.2 kg (77 lb 9.6 oz) 34.428 kg (75 lb 14.4 oz) 35.517 kg (78 lb 4.8 oz)    Exam Gen Exam: Awake-very weak/deconditioned-speaks very softly. Lying in bed without any acute distress Neck: Supple, No JVD.  Chest: Coarse-mostly transmitted upper airway sounds (pools secretions)-moving air bilaterally. CVS: S1 S2 Regular,  no murmurs.  Abdomen: soft, BS +, non tender, non distended.  Extremities: Bilateral BKA Neurologic: Non Focal.  Skin: No Rash.  Wounds: N/A.   DISCHARGE CONDITION: Stable  DISPOSITION: SNF with palliative/hospice follow up  DISCHARGE INSTRUCTIONS:    Activity:  As tolerated with Full fall precautions use walker/cane & assistance as needed  Get Medicines reviewed and adjusted: Please take all your medications with you for your next visit with your Primary MD  Please request your Primary MD to go over all hospital tests and procedure/radiological results at the follow up, please ask your Primary MD to get all Hospital records sent to his/her  office.  If you experience worsening of your admission symptoms, develop shortness of breath, life threatening emergency, suicidal or homicidal thoughts you must seek medical attention immediately by calling 911 or calling your MD immediately  if symptoms less severe.  You must read complete instructions/literature along with all the possible adverse reactions/side effects for all the Medicines you take and that have been prescribed to you. Take any new Medicines after you have completely understood and accpet all the possible adverse reactions/side effects.   Do not drive when taking Pain medications.   Do not take more than prescribed Pain, Sleep and Anxiety Medications  Special Instructions: If you have smoked or chewed Tobacco  in the last 2 yrs please stop smoking, stop any regular Alcohol  and or any Recreational drug use.  Wear Seat belts while driving.  Please note  You were cared for by a hospitalist during your hospital stay. Once you are discharged, your primary care physician will handle any further medical issues. Please note that NO REFILLS for any discharge medications will be authorized once you are discharged, as it is imperative that you return to your primary care physician (or establish a relationship with a primary care physician if you do not have one) for your aftercare needs so that they can reassess your need for medications and monitor your lab values.   Diet recommendation: Dysphagia 1 diet with nectar thick liquids  Discharge Instructions    Diet - low sodium heart healthy    Complete by:  As directed   Dysphagia 1 (Puree) solids;Nectar thick liquid  Liquid Administration via Spoon Medication Administration: Crushed with puree Compensations:Slow rate;Small sips/bites;Follow solids with liquid;Multiple dry swallows after each bite/sip Postural Changes:Seated upright at 90 degrees;Remain semi-upright after after feeds/meals     Increase activity slowly     Complete by:  As directed            Follow-up Information    Follow up with DUDA,MARCUS V, MD In 2 weeks.   Specialty:  Orthopedic Surgery   Contact information:   [place] [address] [phone]       Follow up with Jilda Panda, MD. Schedule an appointment as soon as possible for a visit in 2 weeks.   Specialty:  Internal Medicine   Contact information:   [address] [place] [address] [phone]       Follow up with Adrian Prows, MD.   Specialty:  Cardiology   Why:  As needed   Contact information:   [place] [address] [phone]       Total Time spent on discharge equals  45 minutes.  SignedOren Binet 06/10/2015 11:53 AM

## 2015-06-10 NOTE — Care Management Important Message (Signed)
Important Message  Patient Details  Name: Shannon Rice MRN: [id number] Date of Birth: 05-Jul-1931   Medicare Important Message Given:  Yes    Teaghan Formica P Dezarai Prew 06/10/2015, 2:06 PM

## 2015-06-10 NOTE — Clinical Social Work Note (Signed)
Per MD patient ready to DC back to [facility]. RN, patient/family, and facility notified of patient's DC. RN given number for report. DC packet on patient's chart. Ambulance transport requested for patient. Facility aware that the patient will need palliative services at SNF. CSW signing off at this time.   Roddie McBryant Tilia Faso MSW, [place], [place], [phone]

## 2015-06-14 ENCOUNTER — Non-Acute Institutional Stay (SKILLED_NURSING_FACILITY): Payer: Medicare Other | Admitting: Internal Medicine

## 2015-06-14 ENCOUNTER — Encounter: Payer: Self-pay | Admitting: Internal Medicine

## 2015-06-14 DIAGNOSIS — K5900 Constipation, unspecified: Secondary | ICD-10-CM

## 2015-06-14 DIAGNOSIS — I482 Chronic atrial fibrillation, unspecified: Secondary | ICD-10-CM

## 2015-06-14 DIAGNOSIS — R627 Adult failure to thrive: Secondary | ICD-10-CM | POA: Diagnosis not present

## 2015-06-14 DIAGNOSIS — K219 Gastro-esophageal reflux disease without esophagitis: Secondary | ICD-10-CM

## 2015-06-14 DIAGNOSIS — R1310 Dysphagia, unspecified: Secondary | ICD-10-CM

## 2015-06-14 DIAGNOSIS — R5381 Other malaise: Secondary | ICD-10-CM | POA: Diagnosis not present

## 2015-06-14 DIAGNOSIS — E46 Unspecified protein-calorie malnutrition: Secondary | ICD-10-CM

## 2015-06-14 DIAGNOSIS — J690 Pneumonitis due to inhalation of food and vomit: Secondary | ICD-10-CM | POA: Diagnosis not present

## 2015-06-14 DIAGNOSIS — I739 Peripheral vascular disease, unspecified: Secondary | ICD-10-CM | POA: Diagnosis not present

## 2015-06-14 NOTE — Progress Notes (Signed)
Patient ID: Shannon Rice, female   DOB: 12/08/1930, 81 y.o.   MRN: [id number]     [facility]  PCP: Ralene Ok, MD  Code Status: DNR  No Known Allergies  Chief Complaint  Patient presents with  . New Admit To SNF    New Admission      HPI:  81 y.o. patient is here for rehabilitation post hospital admission with critical limb ischemia and gangrene of right foot in setting of her severe PVD. She underwent transtibial amputation on 06/04/15. She had afib with RVR and her cardizem dosing was adjusted. She was also treated for aspiration pneumonitis and is currently on augmentin. She had acute on chronic CHF exacerbation and required diuresis.  She is at present on pureed diet and is followed by SLP team. Family would like this changed to comfort feed. She is here for rehabilitation but family would like comfort care at present. She is seen in her room with her sister and daughter present. She appears uncomfortable and complaints of pain. She is not participating in HPI or ROS at present.   Review of Systems:  Unable to obtain    Past Medical History  Diagnosis Date  . Hypertension   . GERD (gastroesophageal reflux disease)   . Lupus (HCC)     skin, scleraderma  . Scleroderma (HCC)   . Arthritis     scleroderma  . Peripheral vascular disease (HCC)   . PAD (peripheral artery disease) (HCC)   . Heart murmur   . Chronic diastolic (congestive) heart failure [facility])    Past Surgical History  Procedure Laterality Date  . Abdominal hysterectomy    . Coronary angioplasty      Dr [name]  . Sympathectomy Right 01/27/2015    Procedure: RIGHT RADICAL ARTERY SYMPATHECTOMY, RIGHT ULNAR ARTERY SYMPATHECTOMY, DIGITAL SYMPATHECTOMY RIGHT INDEX, MIDDLE, RING SMALL;  Surgeon: [name], MD;  Location: [facility];  Service: Orthopedics;  Laterality: Right;  . Amputation Right 01/27/2015    Procedure: AMPUTATION DISTAL TIP RIGHT INDEX FINGER;  Surgeon: [name], MD;  Location: [facility];  Service: Orthopedics;  Laterality: Right;  . Peripheral vascular catheterization N/A 02/15/2015    Procedure: Lower Extremity Angiography;  Surgeon: [name], MD;  Location: [facility] INVASIVE CV LAB;  Service: Cardiovascular;  Laterality: N/A;  . Peripheral vascular catheterization Left 04/12/2015    Procedure: Lower Extremity Angiography;  Surgeon: [name], MD;  Location: [facility] INVASIVE CV LAB;  Service: Cardiovascular;  Laterality: Left;  . Peripheral vascular catheterization Left 04/12/2015    Procedure: Peripheral Vascular Atherectomy;  Surgeon: [name], MD;  Location: [facility] INVASIVE CV LAB;  Service: Cardiovascular;  Laterality: Left;  sfa     . Amputation Left 04/20/2015    Procedure: Left Below Knee Amputation;  Surgeon: Nadara Mustard, MD;  Location: [facility] OR;  Service: Orthopedics;  Laterality: Left;  . Peripheral arterial stent graft Left 05/17/2015  . Peripheral vascular catheterization N/A 05/17/2015    Procedure: Lower Extremity Intervention;  Surgeon: [name], MD;  Location: [facility] INVASIVE CV LAB;  Service: Cardiovascular;  Laterality: N/A;  . Amputation Right 06/04/2015    Procedure: AMPUTATION BELOW KNEE;  Surgeon: Nadara Mustard, MD;  Location: [facility] OR;  Service: Orthopedics;  Laterality: Right;   Social History:   reports that she quit smoking about 6 months ago. Her smoking use included Cigarettes. She has a 15 pack-year smoking history. She has never used smokeless tobacco. She reports  that she drinks alcohol. She reports that she does not use illicit drugs.  Family History  Problem Relation Age of Onset  . Diabetes Sister   . Diabetes Brother     Medications:   Medication List       This list is accurate as of: 06/14/15 11:50 AM.  Always use your most recent med list.               acetaminophen 325 MG tablet  Commonly known as:  TYLENOL  Take 2 tablets (650 mg total) by mouth every 6 (six) hours as needed for mild pain (or Fever  >/= 101).     apixaban 2.5 MG Tabs tablet  Commonly known as:  ELIQUIS  Take 1 tablet (2.5 mg total) by mouth 2 (two) times daily.     diltiazem 30 MG tablet  Commonly known as:  CARDIZEM  Take 1 tablet (30 mg total) by mouth every 8 (eight) hours.     feeding supplement (PRO-STAT SUGAR FREE 64) Liqd  Take 30 mLs by mouth 2 (two) times daily.     levalbuterol 0.63 MG/3ML nebulizer solution  Commonly known as:  XOPENEX  Take 3 mLs (0.63 mg total) by nebulization every 6 (six) hours as needed for wheezing or shortness of breath.     LIVALO 2 MG Tabs  Generic drug:  Pitavastatin Calcium  Take 2 mg by mouth 2 (two) times daily.     morphine 30 MG 12 hr tablet  Commonly known as:  MS CONTIN  Take 1 tablet (30 mg total) by mouth every 12 (twelve) hours.     morphine CONCENTRATE 10 MG/0.5ML Soln concentrated solution  Take 0.5 mLs (10 mg total) by mouth every 4 (four) hours as needed for moderate pain, severe pain or anxiety (Comfort).     omeprazole 20 MG capsule  Commonly known as:  PRILOSEC  Take 1 capsule (20 mg total) by mouth daily.     ondansetron 4 MG disintegrating tablet  Commonly known as:  ZOFRAN ODT  Take 1 tablet (4 mg total) by mouth every 8 (eight) hours as needed for nausea or vomiting.     polyethylene glycol packet  Commonly known as:  MIRALAX / GLYCOLAX  Take 17 g by mouth 2 (two) times daily.     potassium chloride 20 MEQ/15ML (10%) Soln  Take 15 mLs (20 mEq total) by mouth daily.     pregabalin 50 MG capsule  Commonly known as:  LYRICA  Take 1 capsule (50 mg total) by mouth 3 (three) times daily.     senna-docusate 8.6-50 MG tablet  Commonly known as:  Senokot-S  Take 2 tablets by mouth at bedtime.     UNABLE TO FIND  Med Name: Med Pass (sugar free) 120 cc twice daily between meals         Physical Exam: Filed Vitals:   06/14/15 1137  BP: 110/80  Pulse: 96  Temp: 98.1 F (36.7 C)  TempSrc: Oral  Resp: 24  SpO2: 96%    General-  elderly female, frail, in no acute distress Head- normocephalic, atraumatic Nose- no nasal discharge Throat- moist mucus membrane  Eyes- no pallor, no icterus, no discharge, normal conjunctiva, normal sclera Neck- no cervical lymphadenopathy Cardiovascular- irregular heart rate Respiratory- bilateral poor air entry, no wheeze, no rhonchi, no crackles, no use of accessory muscles Abdomen- bowel sounds present, soft, non tender Musculoskeletal- bilateral BKA, extreme weakness, dressing to both the stump area Neurological- unable to  assess Skin- warm and dry   Labs reviewed: Basic Metabolic Panel:  Recent Labs  05/04/2510/28/16 0226 06/06/15 0912 06/06/15 1212 06/07/15 0515 06/08/15 0215  NA  --  136  --  139 139  K  --  3.3*  --  2.9* 3.6  CL  --  99*  --  97* 97*  CO2  --  27  --  32 35*  GLUCOSE  --  109*  --  132* 106*  BUN  --  11  --  7 8  CREATININE  --  0.40*  --  0.41* 0.43*  CALCIUM  --  8.1*  --  8.4* 8.4*  MG 2.0  --  2.1 1.9  --    Liver Function Tests:  Recent Labs  06/05/15 0523 06/06/15 0912 06/08/15 0215  AST 26 31 29   ALT 10* 11* 12*  ALKPHOS 48 53 60  BILITOT 0.4 1.0 0.6  PROT 6.2* 6.1* 7.2  ALBUMIN 2.4* 2.4* 2.5*    Recent Labs  04/13/15 1515  LIPASE 13*   No results for input(s): AMMONIA in the last 8760 hours. CBC:  Recent Labs  04/19/15 1947  05/24/15 1750  06/02/15 0620  06/06/15 0912 06/07/15 0515 06/08/15 0215  WBC 6.5  < > 7.2  < > 4.8  < > 8.2 8.4 7.7  NEUTROABS 4.7  --  5.6  --  3.5  --   --   --   --   HGB 8.8*  < > 6.6*  < > 10.0*  < > 9.2* 9.7* 9.3*  HCT 28.4*  < > 21.9*  < > 33.1*  < > 29.4* 32.0* 30.9*  MCV 94.4  < > 97.3  < > 95.9  < > 94.5 95.0 95.4  PLT 220  < > 186  < > 152  < > 194 219 239  < > = values in this interval not displayed. Cardiac Enzymes:  Recent Labs  06/06/15 1212 06/06/15 1540 06/06/15 2201  TROPONINI 0.08* 0.05* <0.03   BNP: Invalid input(s): POCBNP CBG:  Recent Labs  05/25/15 0626    GLUCAP 78    Radiological Exams: Dg Foot Complete Right  06/02/2015  CLINICAL DATA:  Gangrene of the foot. EXAM: RIGHT FOOT COMPLETE - 3+ VIEW COMPARISON:  None. FINDINGS: There is no fracture or dislocation. No bone destruction to suggest osteomyelitis. Soft tissue swelling of the dorsum of the foot. Arterial vascular calcifications. IMPRESSION: No acute osseous abnormality.  Osteopenia. Electronically Signed   By: Francene BoyersJames  Maxwell M.D.   On: 06/02/2015 08:55     Assessment/Plan  Physical deconditioning S/p recent right BKA, pneumonia, afib with RVR, CHF exacerbation among others. Unable to participate with therapy at present. Family would like to focus on comfort care. Obtained hospice consult today and spoke with the hospice team.   Severe PAD S/p bilateral BKA now. Currently on ms contin and lyrica. Pain not controlled. Start methadone 5 mg bid and roxanol 5 mg q4h prn pain for now. Reviewed this with hospice team as well. Continue eliquis for now  Dysphagia With high aspiration risk. With comfort measure being the goal of care, will liberalize her food for comfort feeding.   Failure to thrive With severe protein calorie malnutrition. Decline anticipated. Monitor  Protein calorie malnutrition Poor po intake, high aspiration risk, ongoing decline.   afib Rate controlled. Continue apixaban and cardizem 30 mg tid for now and monitor  Aspiration pneumonitis completed her augmentin today, monitor clinically, continue  breathing treatment for now  Constipation  continue senna s and miralax  gerd Continue prilosec 20 mg daily   Goals of care: hospice with comfort care   Labs/tests ordered: none  Family/ staff Communication: reviewed care plan with patient's daughter, hospice nurse practitioner and nursing supervisor    Oneal Grout, MD  [facility] [phone] (Monday-Friday 8 am - 5 pm) [phone] (afterhours)

## 2015-06-20 ENCOUNTER — Non-Acute Institutional Stay (SKILLED_NURSING_FACILITY): Payer: Medicare Other | Admitting: Nurse Practitioner

## 2015-06-20 DIAGNOSIS — R627 Adult failure to thrive: Secondary | ICD-10-CM | POA: Diagnosis not present

## 2015-06-20 DIAGNOSIS — I70229 Atherosclerosis of native arteries of extremities with rest pain, unspecified extremity: Secondary | ICD-10-CM

## 2015-06-20 DIAGNOSIS — I739 Peripheral vascular disease, unspecified: Secondary | ICD-10-CM

## 2015-06-20 DIAGNOSIS — I482 Chronic atrial fibrillation, unspecified: Secondary | ICD-10-CM

## 2015-06-20 DIAGNOSIS — G546 Phantom limb syndrome with pain: Secondary | ICD-10-CM | POA: Diagnosis not present

## 2015-06-20 DIAGNOSIS — I998 Other disorder of circulatory system: Secondary | ICD-10-CM

## 2015-06-20 DIAGNOSIS — E46 Unspecified protein-calorie malnutrition: Secondary | ICD-10-CM

## 2015-06-20 MED ORDER — MORPHINE SULFATE (CONCENTRATE) 10 MG/0.5ML PO SOLN: 5.0000 mg | ORAL | Status: AC | PRN

## 2015-06-20 MED ORDER — METHADONE HCL 5 MG PO TABS: 5.0000 mg | ORAL_TABLET | Freq: Two times a day (BID) | ORAL | Status: AC

## 2015-06-20 NOTE — Progress Notes (Signed)
Patient ID: Shannon SkeetersJohnetta M Zanetti, female   DOB: [birth year], 81 y.o.   MRN: [id number]   Nursing Home Location:  [facility]   Place of Service: SNF (31)  PCP: Ralene OkMOREIRA,ROY, MD  No Known Allergies  Chief Complaint  Patient presents with  . Discharge Note    HPI:  Patient is a 81 y.o. female seen today at [facility] for discharge.  pt with a hx of scleroderma, HTN, PVD, HLD. Patient currently at [facility] for short term rehabilitation post hospital admission from 04/12/15-04/22/15 with left foot gangrene with claudication and osteomyelitis and underwent left lower extremity transtibial amputation by Dr. Lajoyce Cornersuda on 04/20/15 and then gangrene of right foot in setting of her severe PVD. She underwent transtibial amputation on 06/04/15. Pt is now on hospice with comfort care and plan is to discharge home with hospice services at this time. Pt appears comfortable at this time.  Review of Systems:  Review of Systems  Unable to perform ROS: Dementia    Past Medical History  Diagnosis Date  . Hypertension   . GERD (gastroesophageal reflux disease)   . Lupus (HCC)     skin, scleraderma  . Scleroderma (HCC)   . Arthritis     scleroderma  . Peripheral vascular disease (HCC)   . PAD (peripheral artery disease) (HCC)   . Heart murmur   . Chronic diastolic (congestive) heart failure [facility](HCC)    Past Surgical History  Procedure Laterality Date  . Abdominal hysterectomy    . Coronary angioplasty      Dr Jenne CampusMcQueen  . Sympathectomy Right 01/27/2015    Procedure: RIGHT RADICAL ARTERY SYMPATHECTOMY, RIGHT ULNAR ARTERY SYMPATHECTOMY, DIGITAL SYMPATHECTOMY RIGHT INDEX, MIDDLE, RING SMALL;  Surgeon: Cindee SaltGary Kuzma, MD;  Location: [facility];  Service: Orthopedics;  Laterality: Right;  . Amputation Right 01/27/2015    Procedure: AMPUTATION DISTAL TIP RIGHT INDEX FINGER;  Surgeon: Cindee SaltGary Kuzma, MD;  Location: [facility];  Service: Orthopedics;   Laterality: Right;  . Peripheral vascular catheterization N/A 02/15/2015    Procedure: Lower Extremity Angiography;  Surgeon: Yates DecampJay Ganji, MD;  [facility];  Service: Cardiovascular;  Laterality: N/A;  . Peripheral vascular catheterization Left 04/12/2015    Procedure: Lower Extremity Angiography;  Surgeon: Yates DecampJay Ganji, MD;  [facility];  Service: Cardiovascular;  Laterality: Left;  . Peripheral vascular catheterization Left 04/12/2015    Procedure: Peripheral Vascular Atherectomy;  Surgeon: Yates DecampJay Ganji, MD;  [facility];  Service: Cardiovascular;  Laterality: Left;  sfa     . Amputation Left 04/20/2015    Procedure: Left Below Knee Amputation;  Surgeon: Nadara MustardMarcus Duda V, MD;  [facility];  Service: Orthopedics;  Laterality: Left;  . Peripheral arterial stent graft Left 05/17/2015  . Peripheral vascular catheterization N/A 05/17/2015    Procedure: Lower Extremity Intervention;  Surgeon: Yates DecampJay Ganji, MD;  [facility];  Service: Cardiovascular;  Laterality: N/A;  . Amputation Right 06/04/2015    Procedure: AMPUTATION BELOW KNEE;  Surgeon: Nadara MustardMarcus Duda V, MD;  Location: [facility] OR;  Service: Orthopedics;  Laterality: Right;   Social History:   reports that she quit smoking about 6 months ago. Her smoking use included Cigarettes. She has a 15 pack-year smoking history. She has never used smokeless tobacco. She reports that she drinks alcohol. She reports that she does not use illicit drugs.  Family History  Problem Relation Age of Onset  .  Diabetes Sister   . Diabetes Brother     Medications: Patient's Medications  New Prescriptions   METHADONE (DOLOPHINE) 5 MG TABLET    Take 1 tablet (5 mg total) by mouth every 12 (twelve) hours.  Previous Medications   ACETAMINOPHEN (TYLENOL) 325 MG TABLET    Take 2 tablets (650 mg total) by mouth every 6 (six) hours as needed for mild pain (or Fever >/= 101).   AMINO ACIDS-PROTEIN HYDROLYS (FEEDING  SUPPLEMENT, PRO-STAT SUGAR FREE 64,) LIQD    Take 30 mLs by mouth 2 (two) times daily.   APIXABAN (ELIQUIS) 2.5 MG TABS TABLET    Take 1 tablet (2.5 mg total) by mouth 2 (two) times daily.   DILTIAZEM (CARDIZEM) 30 MG TABLET    Take 1 tablet (30 mg total) by mouth every 8 (eight) hours.   LEVALBUTEROL (XOPENEX) 0.63 MG/3ML NEBULIZER SOLUTION    Take 3 mLs (0.63 mg total) by nebulization every 6 (six) hours as needed for wheezing or shortness of breath.   OMEPRAZOLE (PRILOSEC) 20 MG CAPSULE    Take 1 capsule (20 mg total) by mouth daily.   ONDANSETRON (ZOFRAN ODT) 4 MG DISINTEGRATING TABLET    Take 1 tablet (4 mg total) by mouth every 8 (eight) hours as needed for nausea or vomiting.   PITAVASTATIN CALCIUM (LIVALO) 2 MG TABS    Take 2 mg by mouth 2 (two) times daily.   POLYETHYLENE GLYCOL (MIRALAX / GLYCOLAX) PACKET    Take 17 g by mouth 2 (two) times daily.   POTASSIUM CHLORIDE 20 MEQ/15ML (10%) SOLN    Take 15 mLs (20 mEq total) by mouth daily.   SENNA-DOCUSATE (SENOKOT-S) 8.6-50 MG TABLET    Take 2 tablets by mouth at bedtime.   UNABLE TO FIND    Med Name: Med Pass (sugar free) 120 cc twice daily between meals  Modified Medications   Modified Medication Previous Medication   MORPHINE SULFATE (MORPHINE CONCENTRATE) 10 MG/0.5ML SOLN CONCENTRATED SOLUTION Morphine Sulfate (MORPHINE CONCENTRATE) 10 MG/0.5ML SOLN concentrated solution      Take 0.25 mLs (5 mg total) by mouth every 4 (four) hours as needed for moderate pain, severe pain or anxiety (Comfort).    Take 0.5 mLs (10 mg total) by mouth every 4 (four) hours as needed for moderate pain, severe pain or anxiety (Comfort).  Discontinued Medications   MORPHINE (MS CONTIN) 30 MG 12 HR TABLET    Take 1 tablet (30 mg total) by mouth every 12 (twelve) hours.   PREGABALIN (LYRICA) 50 MG CAPSULE    Take 1 capsule (50 mg total) by mouth 3 (three) times daily.     Physical Exam: Filed Vitals:   06/20/15 1644  BP: 134/54  Pulse: 79  Temp: 97.5 F  (36.4 C)  Resp: 20    Physical Exam  Constitutional: No distress.  Thin AA female NAD  HENT:  Head: Normocephalic and atraumatic.  Cardiovascular: Normal rate, regular rhythm and normal heart sounds.   Pulmonary/Chest: Effort normal and breath sounds normal.  Abdominal: Soft. Bowel sounds are normal. She exhibits no distension. There is no tenderness.  Musculoskeletal:  Bilateral BKA  Neurological: She is alert.  Skin: Skin is warm and dry. She is not diaphoretic.  Psychiatric: She has a normal mood and affect.    Labs reviewed: Basic Metabolic Panel:  Recent Labs  16/10/96 0226 06/06/15 0912 06/06/15 1212 06/07/15 0515 06/08/15 0215  NA  --  136  --  139 139  K  --  3.3*  --  2.9* 3.6  CL  --  99*  --  97* 97*  CO2  --  27  --  32 35*  GLUCOSE  --  109*  --  132* 106*  BUN  --  11  --  7 8  CREATININE  --  0.40*  --  0.41* 0.43*  CALCIUM  --  8.1*  --  8.4* 8.4*  MG 2.0  --  2.1 1.9  --    Liver Function Tests:  Recent Labs  06/05/15 0523 06/06/15 0912 06/08/15 0215  AST ALT 10* 11* 12*  ALKPHOS 48 53 60  BILITOT 0.4 1.0 0.6  PROT 6.2* 6.1* 7.2  ALBUMIN 2.4* 2.4* 2.5*    Recent Labs  04/13/15 1515  LIPASE 13*   No results for input(s): AMMONIA in the last 8760 hours. CBC:  Recent Labs  04/19/15 1947  05/24/15 1750  06/02/15 0620  06/06/15 0912 06/07/15 0515 06/08/15 0215  WBC 6.5  < > 7.2  < > 4.8  < > 8.2 8.4 7.7  NEUTROABS 4.7  --  5.6  --  3.5  --   --   --   --   HGB 8.8*  < > 6.6*  < > 10.0*  < > 9.2* 9.7* 9.3*  HCT 28.4*  < > 21.9*  < > 33.1*  < > 29.4* 32.0* 30.9*  MCV 94.4  < > 97.3  < > 95.9  < > 94.5 95.0 95.4  PLT 220  < > 186  < > 152  < > 194 219 239  < > = values in this interval not displayed. TSH:  Recent Labs  05/25/15 0616 06/06/15 0226  TSH 5.185* 4.260   A1C: No results found for: HGBA1C Lipid Panel:  Recent Labs  05/25/15 0616  CHOL 78  HDL 26*  LDLCALC 43  TRIG 47  CHOLHDL 3.0      Assessment/Plan  1. Severe peripheral arterial disease (HCC) Bilateral BKA, wounds non healing, left stump with increase drainage.  -conts on methadone and PRN morphine. Going home with hospice  2. Adult failure to thrive Ongoing FTT, comfort is goal at this time.   3. Protein-calorie malnutrition (HCC) Poor PO intake and overall decline, hospice care at this time  4. Chronic atrial fibrillation (HCC) Rate controlled,  Maintained on cardizem, also on eliquis for anticoagulation  5. Critical lower limb ischemia -bilateral BKA, cont wound care  6. Phantom pain after amputation of lower extremity (HCC) Without signs of worsening pain, pt conts on methadone and PRN roxanol    Pt stable for discharge home with hospice   Stanely Sexson K. Biagio Borg  [facility] [phone] 8 am - 5 pm) [phone] (after hours)

## 2015-07-10 DEATH — deceased

## 2016-01-26 IMAGING — CR DG CHEST 1V PORT
1 series · 1 of 1 positions shown · non-contrast
Comparison: 04/17/2015

CLINICAL DATA: Postop, intubated, possible aspiration

EXAM:
PORTABLE CHEST 1 VIEW

[AP]
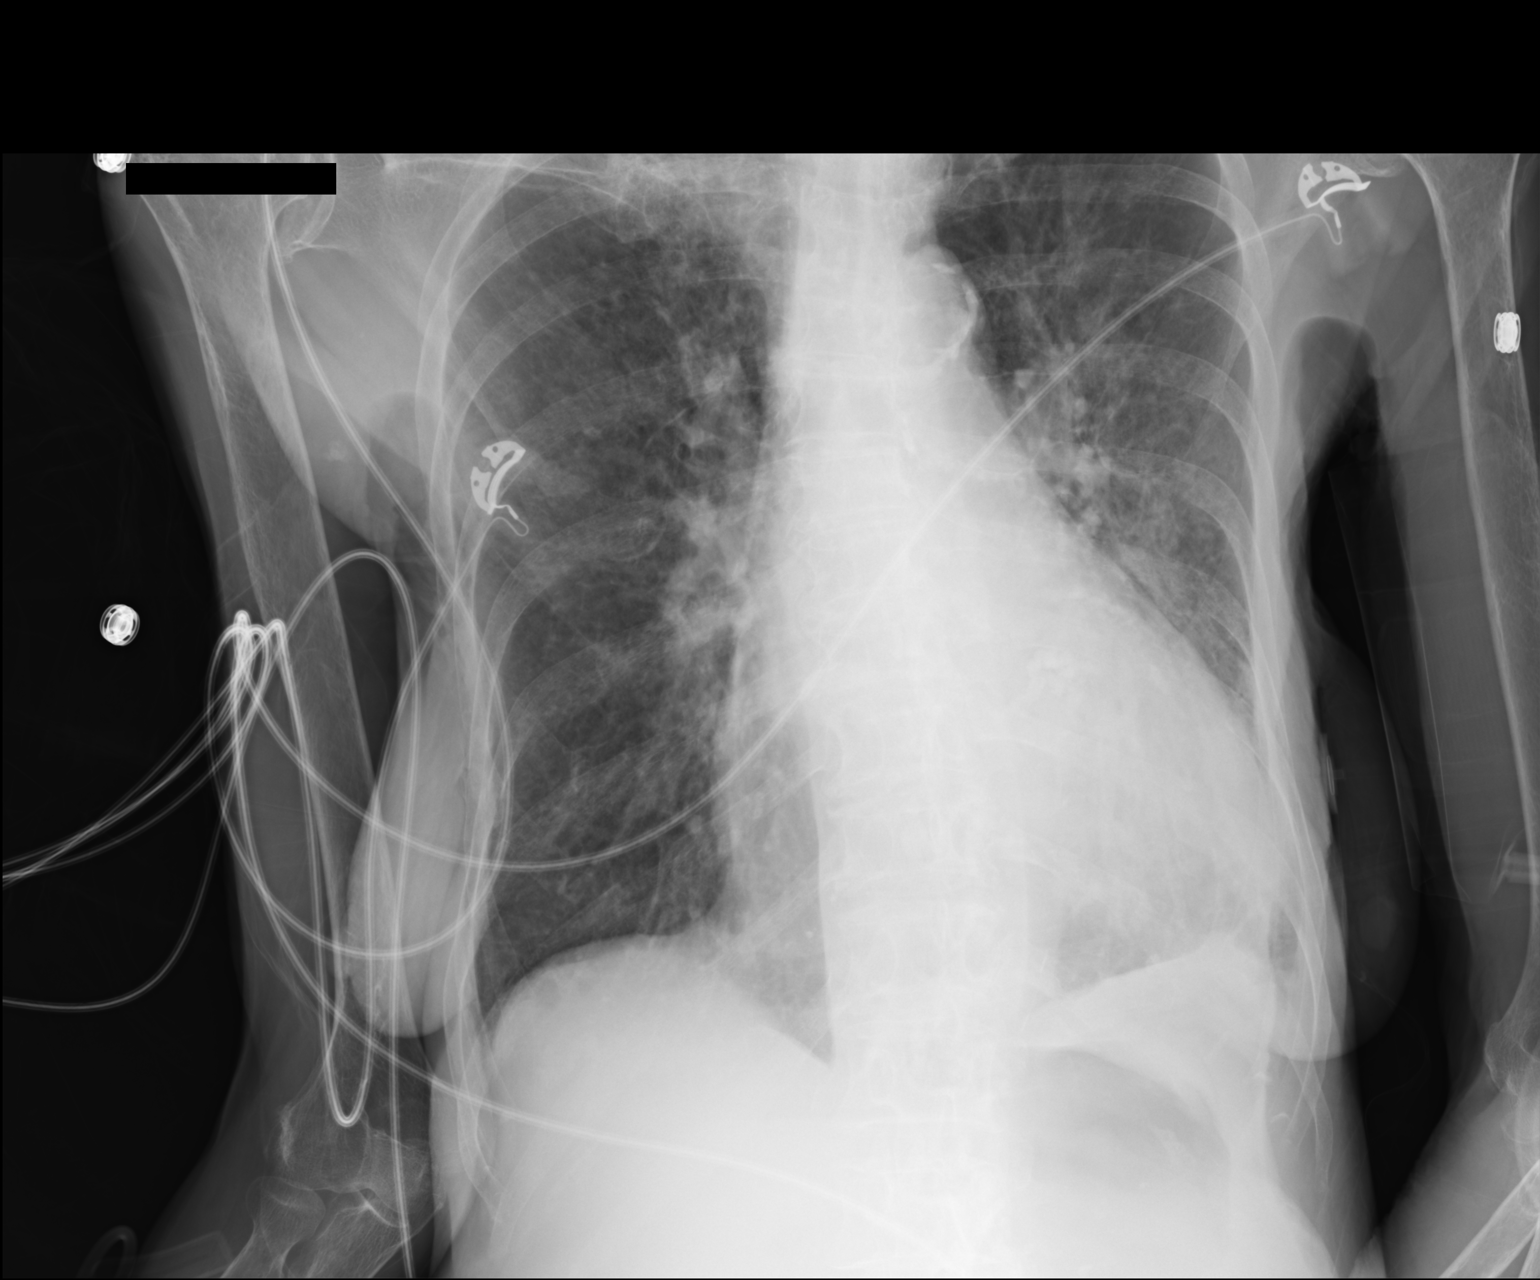

[1 of 1 positions shown; findings below may reference images not displayed]

FINDINGS: Cardiomediastinal silhouette is stable. Mild hyperinflation. Stable
left basilar atelectasis or scarring. There is left perihilar
atelectasis or infiltrate. No pulmonary edema pre
IMPRESSION: Mild hyperinflation. Cardiomegaly again noted. Left perihilar
atelectasis or infiltrate. No pulmonary edema.

## 2016-01-27 IMAGING — CR DG CHEST 1V PORT
1 series · 1 of 1 positions shown · non-contrast
Comparison: 06/04/2015

CLINICAL DATA: Fever

EXAM:
PORTABLE CHEST 1 VIEW

[AP]
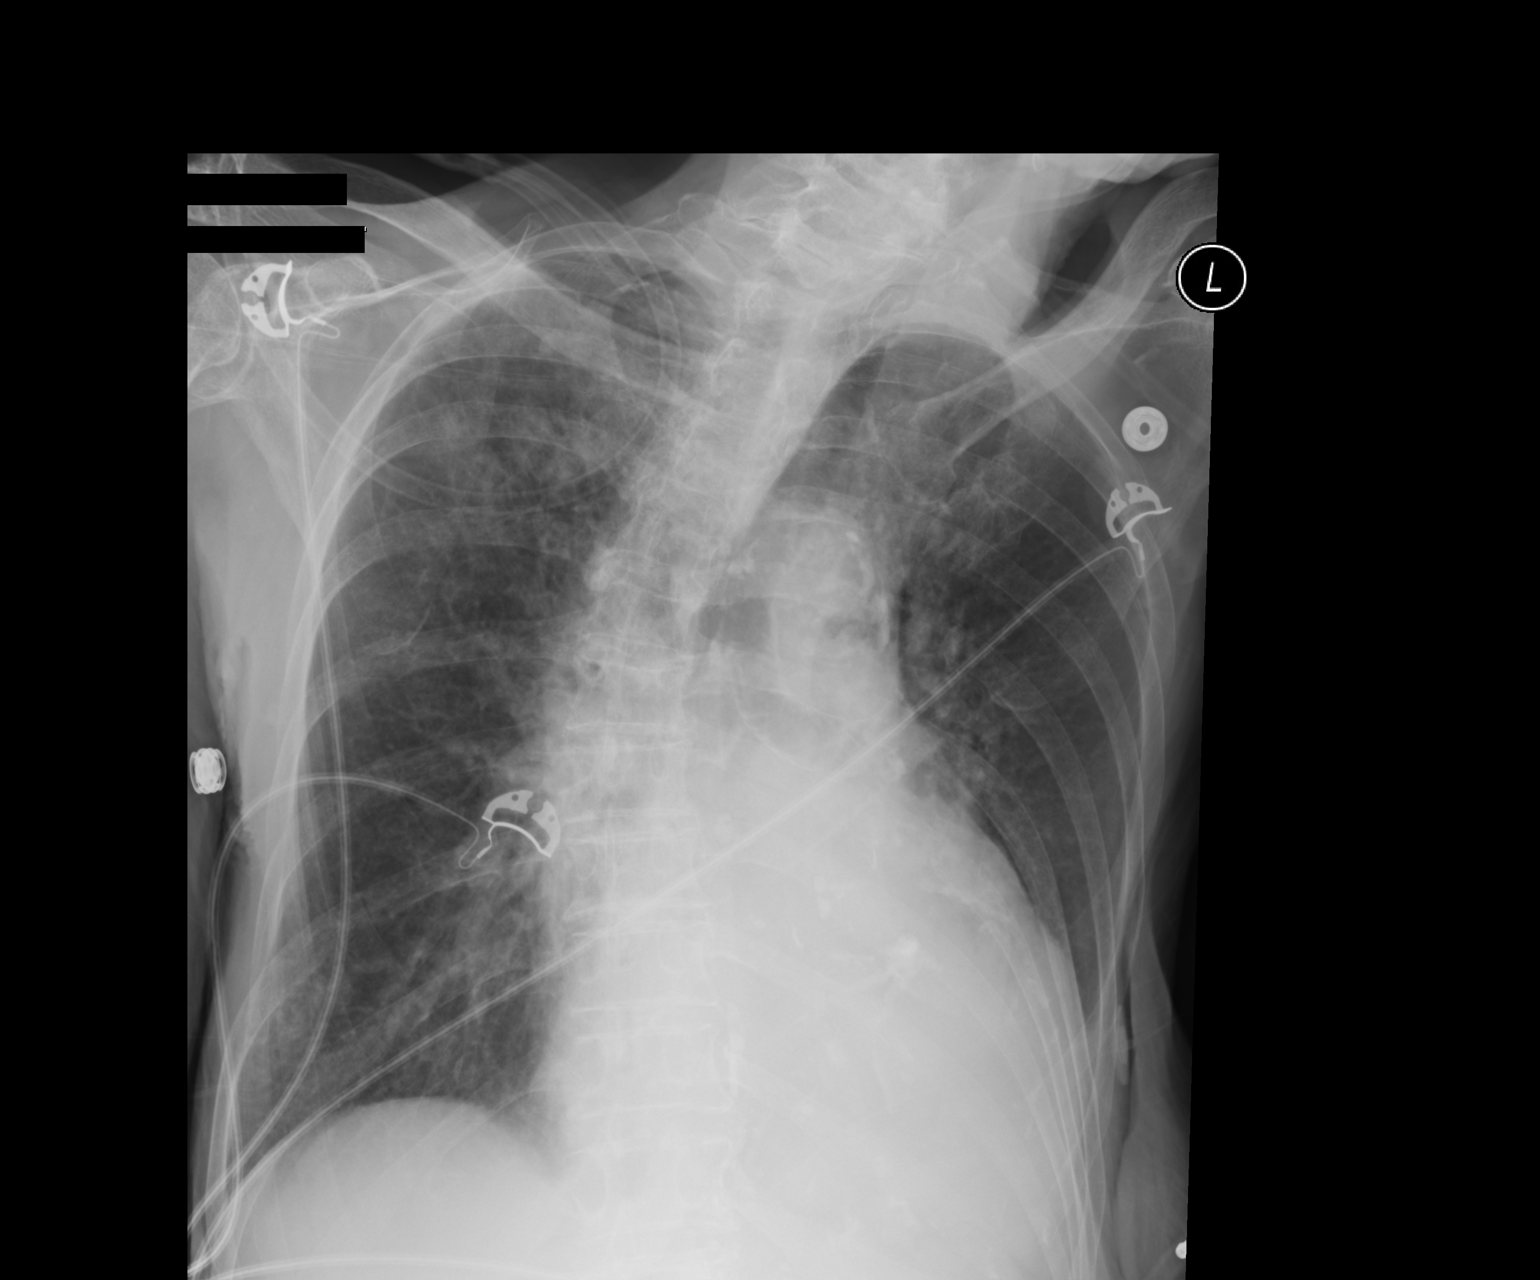

[1 of 1 positions shown; findings below may reference images not displayed]

FINDINGS: Cardiomegaly again noted. No pulmonary edema. Persistent streaky
left perihilar atelectasis or infiltrate. New streaky atelectasis or
infiltrate in right upper lobe.
IMPRESSION: Persistent left perihilar atelectasis or infiltrate. New streaky
atelectasis or infiltrate in right upper lobe. Cardiomegaly again
noted.

## 2016-01-29 IMAGING — CR DG CHEST 1V PORT
1 series · 1 of 1 positions shown · non-contrast
Comparison: 06/05/2015, 06/04/2015, 04/17/2015, 04/13/2015

CLINICAL DATA: Shortness of breath.  Chest pain.

EXAM:
PORTABLE CHEST 1 VIEW

[AP]
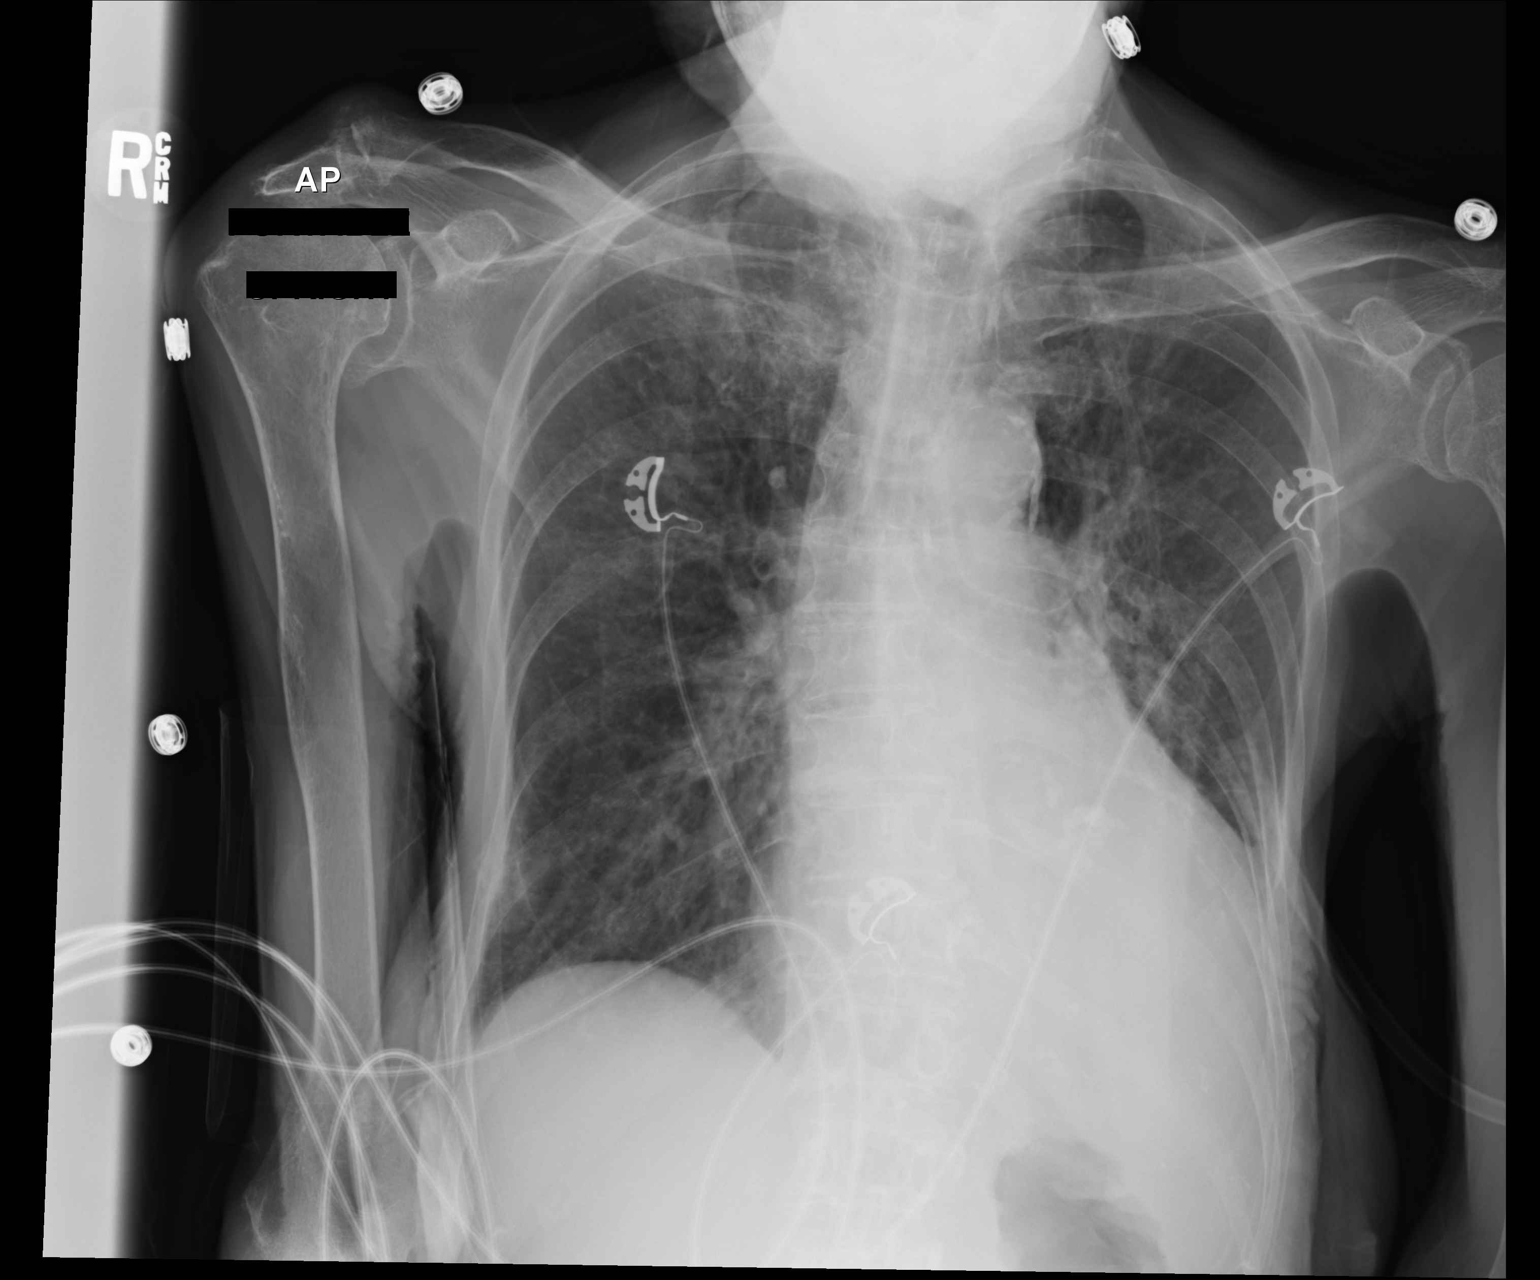

[1 of 1 positions shown; findings below may reference images not displayed]

FINDINGS: Stable cardiomegaly. Thoracic aorta atherosclerotic calcification
noted. The central bronchial airways appear thickened. There is
streaky opacity in the right upper lobe, unchanged.

Obscuration of the left hemidiaphragm blunting of the left
costophrenic angle is unchanged compared to yesterday's chest
radiograph.

There is suggestion of periosteal reaction about the lateral seventh
and are eighth ribs, for which healing fractures cannot be excluded.
Dedicated rib radiographs could be useful if indicated. Marked right
acromioclavicular joint region degenerative changes. High-riding
right humeral head suggests chronic rotator cuff disease.
IMPRESSION: No significant change in aeration compared to the chest radiograph
of 06/05/2015. Streaky opacities in the right upper lung could
reflect airspace disease and/or atelectasis/scarring.

Left basilar opacity obscures left hemidiaphragm and could reflect
airspace disease, atelectasis, and/or effusion.

Cannot exclude healing right lateral rib fractures as described
above.

## 2016-01-31 IMAGING — RF DG SWALLOWING FUNCTION - NRPT [facility]
1 series · 18 of 24 positions shown · non-contrast
Comparison: none

[Series 1: run · 23 acquisitions, 18 frames shown]
[im 1/23]
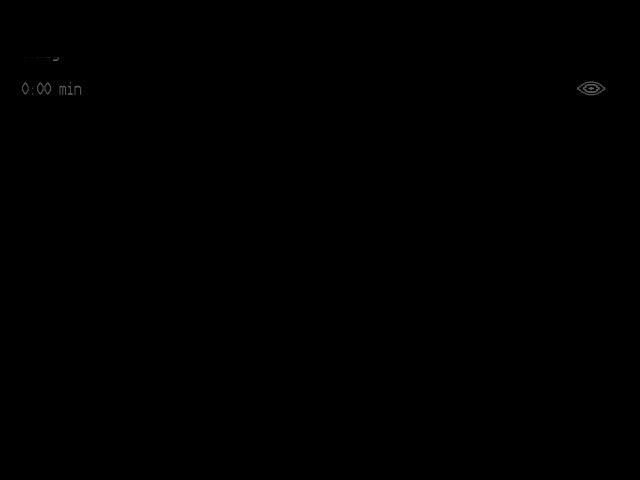
[im 3/23]
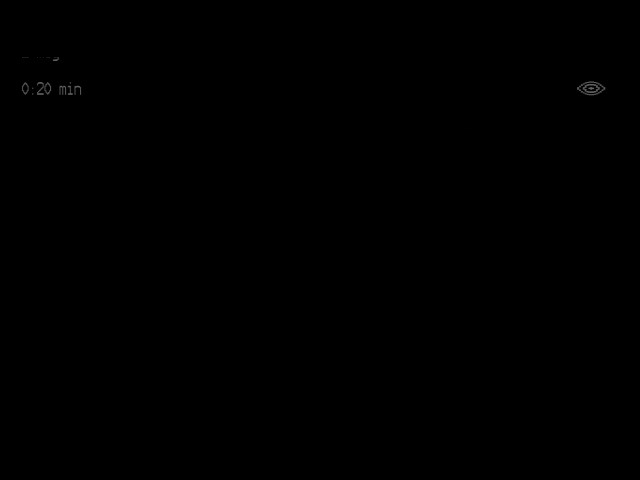
[im 4/23]
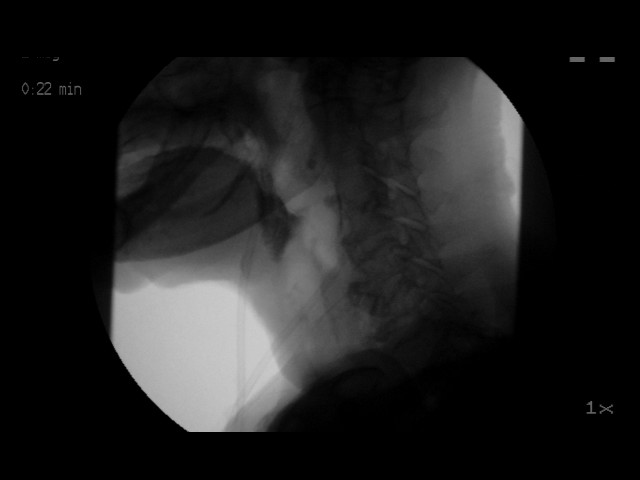
[im 5/23]
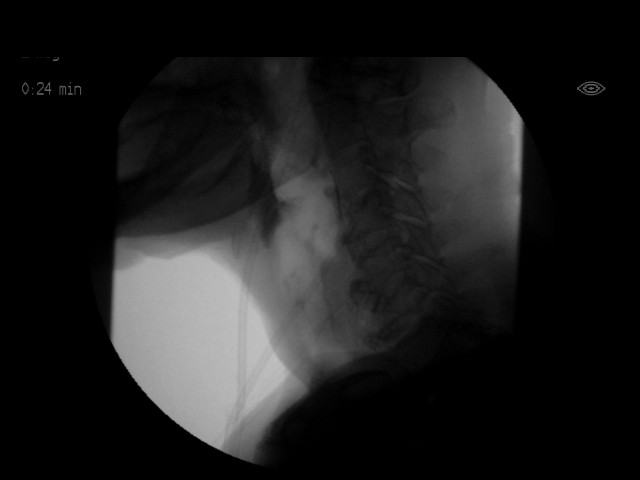
[im 7/23]
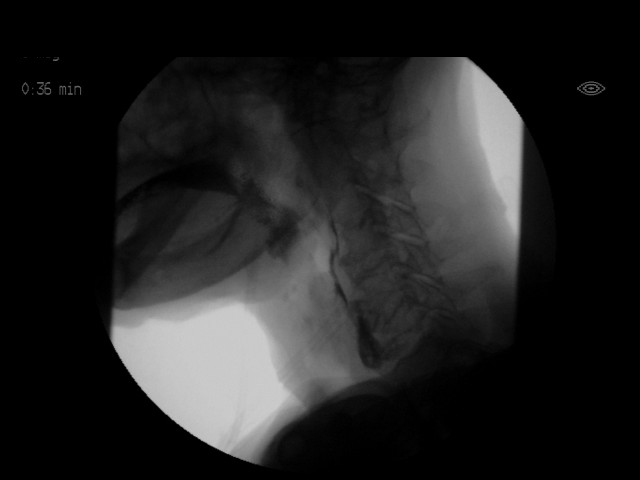
[im 8/23]
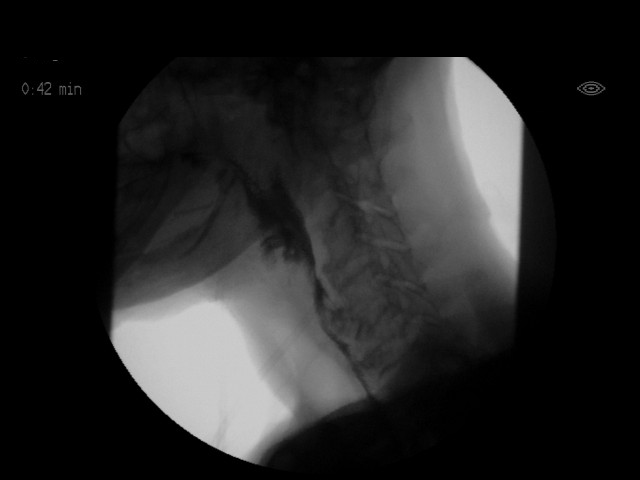
[im 9/23]
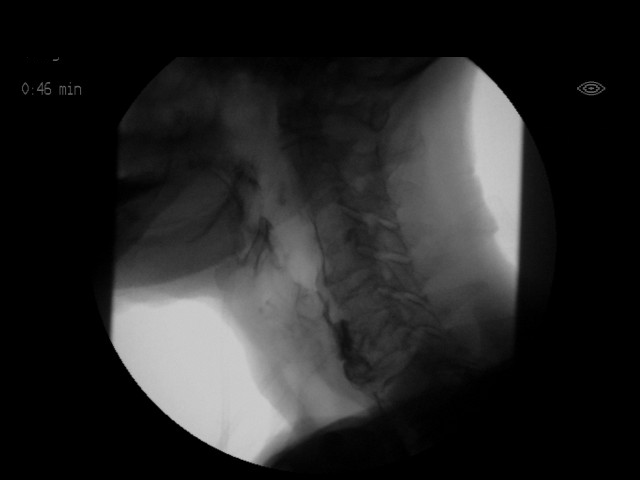
[im 11/23]
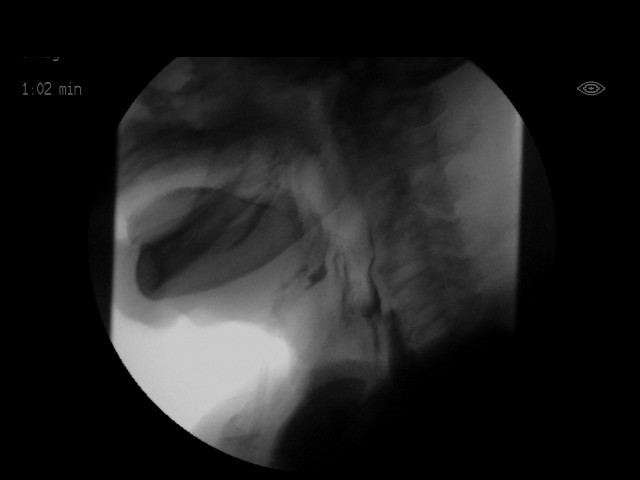
[im 12/23]
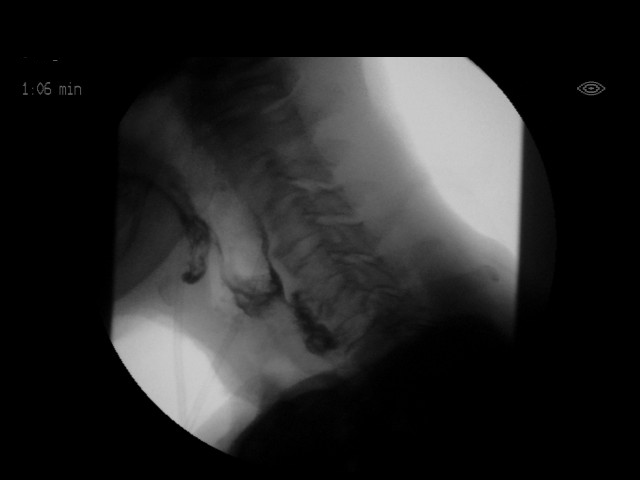
[im 12/23]
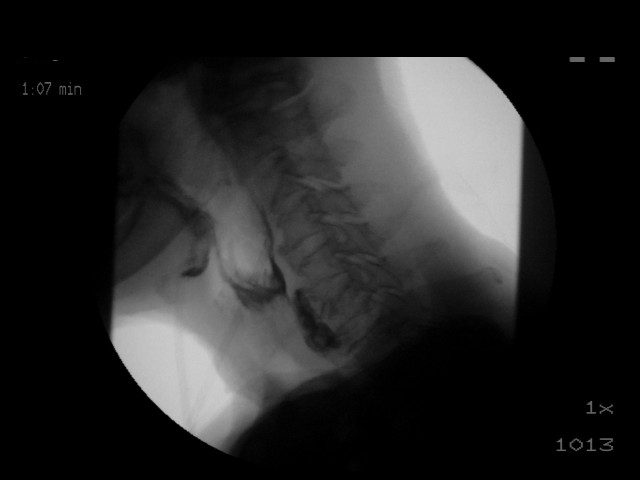
[im 14/23]
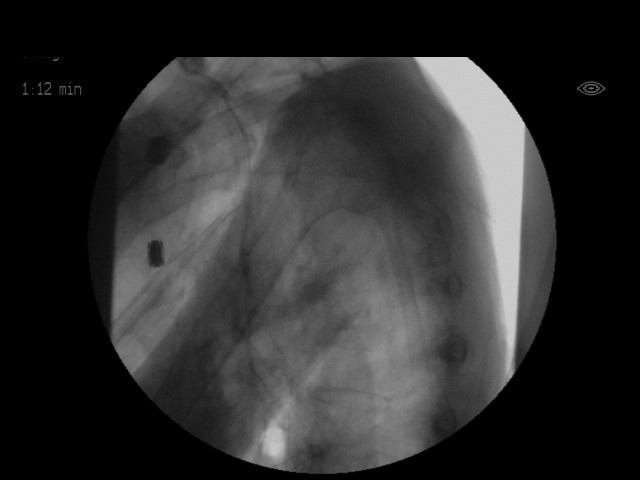
[im 15/23]
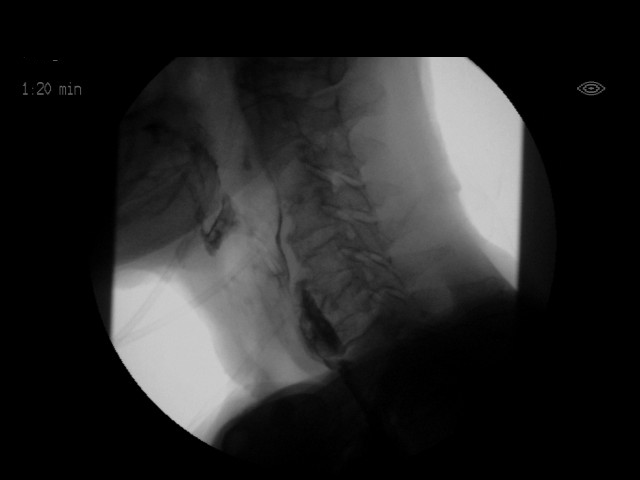
[im 16/23]
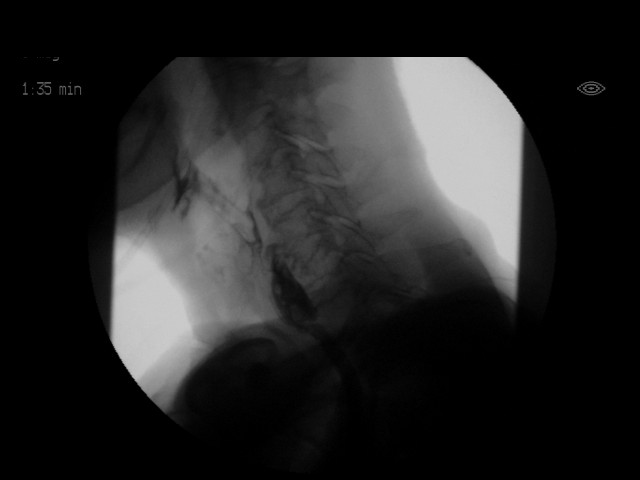
[im 18/23]
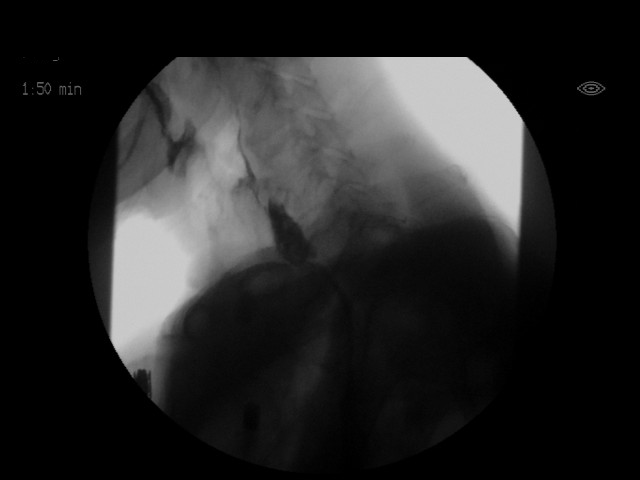
[im 19/23]
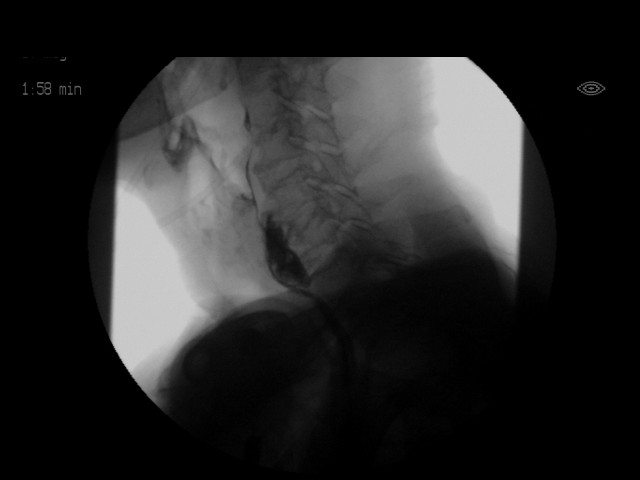
[im 20/23]
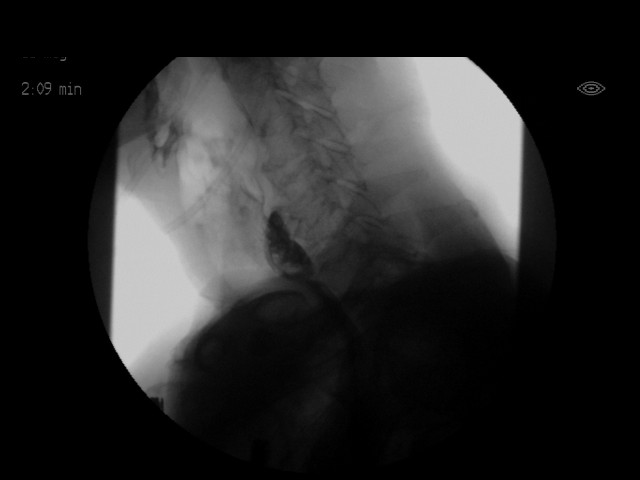
[im 22/23]
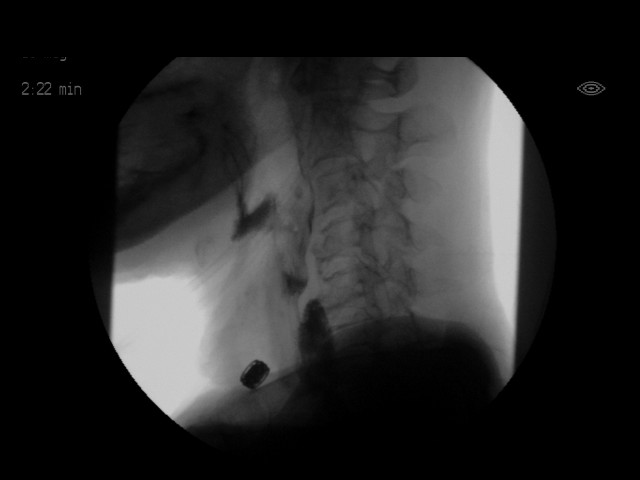
[im 23/23]
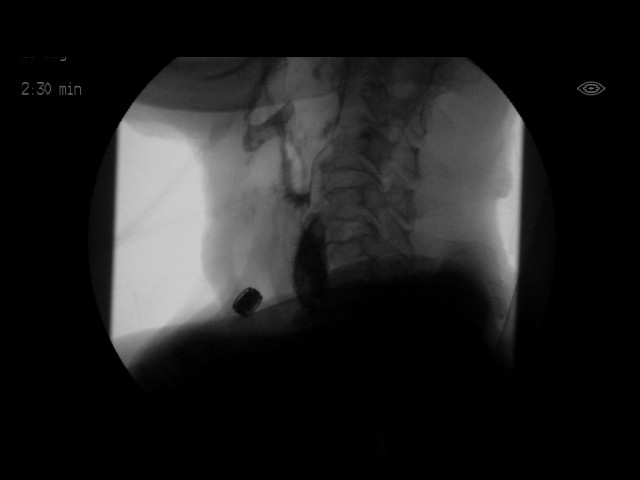

[18 of 24 positions shown; findings below may reference images not displayed]

FLUOROSCOPY FOR SWALLOWING FUNCTION STUDY:
Fluoroscopy was provided for swallowing function study, which was administered by a speech pathologist.  Final results and recommendations from this study are contained within the speech pathology report.

## 2016-10-01 ENCOUNTER — Telehealth: Payer: Self-pay | Admitting: Internal Medicine

## 2017-08-12 NOTE — Telephone Encounter (Signed)
Deceased..cdavis  °
